# Patient Record
Sex: Female | Born: 1985 | Race: White | Hispanic: No | Marital: Married | Smoking: Never smoker
Health system: Southern US, Community
[De-identification: ages and names within clinical notes are randomized; demographics above are authoritative.]

## PROBLEM LIST (undated history)

## (undated) ENCOUNTER — Inpatient Hospital Stay (HOSPITAL_COMMUNITY): Payer: Self-pay

## (undated) DIAGNOSIS — F32A Depression, unspecified: Secondary | ICD-10-CM

## (undated) DIAGNOSIS — D680 Von Willebrand's disease: Secondary | ICD-10-CM

## (undated) DIAGNOSIS — D649 Anemia, unspecified: Secondary | ICD-10-CM

## (undated) DIAGNOSIS — F319 Bipolar disorder, unspecified: Secondary | ICD-10-CM

## (undated) DIAGNOSIS — G43909 Migraine, unspecified, not intractable, without status migrainosus: Secondary | ICD-10-CM

## (undated) DIAGNOSIS — N289 Disorder of kidney and ureter, unspecified: Secondary | ICD-10-CM

## (undated) DIAGNOSIS — Z87442 Personal history of urinary calculi: Secondary | ICD-10-CM

## (undated) DIAGNOSIS — R011 Cardiac murmur, unspecified: Secondary | ICD-10-CM

## (undated) DIAGNOSIS — O24419 Gestational diabetes mellitus in pregnancy, unspecified control: Secondary | ICD-10-CM

## (undated) DIAGNOSIS — F329 Major depressive disorder, single episode, unspecified: Secondary | ICD-10-CM

## (undated) DIAGNOSIS — K219 Gastro-esophageal reflux disease without esophagitis: Secondary | ICD-10-CM

## (undated) DIAGNOSIS — N200 Calculus of kidney: Secondary | ICD-10-CM

## (undated) DIAGNOSIS — D259 Leiomyoma of uterus, unspecified: Secondary | ICD-10-CM

## (undated) DIAGNOSIS — Z8719 Personal history of other diseases of the digestive system: Secondary | ICD-10-CM

## (undated) DIAGNOSIS — D759 Disease of blood and blood-forming organs, unspecified: Secondary | ICD-10-CM

## (undated) HISTORY — DX: Gestational diabetes mellitus in pregnancy, unspecified control: O24.419

## (undated) HISTORY — DX: Bipolar disorder, unspecified: F31.9

## (undated) HISTORY — DX: Depression, unspecified: F32.A

## (undated) HISTORY — DX: Major depressive disorder, single episode, unspecified: F32.9

## (undated) HISTORY — PX: RENAL ARTERY STENT: SHX2321

---
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
## 2004-03-23 ENCOUNTER — Emergency Department (HOSPITAL_COMMUNITY): Admission: EM | Admit: 2004-03-23 | Discharge: 2004-03-23 | Payer: Self-pay | Admitting: Family Medicine

## 2004-10-19 ENCOUNTER — Ambulatory Visit (HOSPITAL_COMMUNITY): Admission: RE | Admit: 2004-10-19 | Discharge: 2004-10-19 | Payer: Self-pay | Admitting: Family Medicine

## 2004-10-19 ENCOUNTER — Emergency Department (HOSPITAL_COMMUNITY): Admission: EM | Admit: 2004-10-19 | Discharge: 2004-10-19 | Payer: Self-pay | Admitting: Family Medicine

## 2004-12-16 ENCOUNTER — Ambulatory Visit (HOSPITAL_COMMUNITY): Admission: RE | Admit: 2004-12-16 | Discharge: 2004-12-16 | Payer: Self-pay | Admitting: Urology

## 2004-12-16 ENCOUNTER — Ambulatory Visit (HOSPITAL_BASED_OUTPATIENT_CLINIC_OR_DEPARTMENT_OTHER): Admission: RE | Admit: 2004-12-16 | Discharge: 2004-12-16 | Payer: Self-pay | Admitting: Urology

## 2004-12-18 ENCOUNTER — Emergency Department (HOSPITAL_COMMUNITY): Admission: EM | Admit: 2004-12-18 | Discharge: 2004-12-18 | Payer: Self-pay | Admitting: Emergency Medicine

## 2005-12-18 ENCOUNTER — Emergency Department (HOSPITAL_COMMUNITY): Admission: EM | Admit: 2005-12-18 | Discharge: 2005-12-18 | Payer: Self-pay | Admitting: Family Medicine

## 2006-01-24 ENCOUNTER — Emergency Department (HOSPITAL_COMMUNITY): Admission: EM | Admit: 2006-01-24 | Discharge: 2006-01-24 | Payer: Self-pay | Admitting: Family Medicine

## 2006-02-01 ENCOUNTER — Emergency Department (HOSPITAL_COMMUNITY): Admission: EM | Admit: 2006-02-01 | Discharge: 2006-02-01 | Payer: Self-pay | Admitting: Family Medicine

## 2006-02-09 ENCOUNTER — Emergency Department (HOSPITAL_COMMUNITY): Admission: EM | Admit: 2006-02-09 | Discharge: 2006-02-09 | Payer: Self-pay | Admitting: Emergency Medicine

## 2006-12-09 ENCOUNTER — Emergency Department (HOSPITAL_COMMUNITY): Admission: EM | Admit: 2006-12-09 | Discharge: 2006-12-09 | Payer: Self-pay | Admitting: Emergency Medicine

## 2006-12-14 ENCOUNTER — Emergency Department (HOSPITAL_COMMUNITY): Admission: EM | Admit: 2006-12-14 | Discharge: 2006-12-14 | Payer: Self-pay | Admitting: Emergency Medicine

## 2007-01-08 ENCOUNTER — Emergency Department (HOSPITAL_COMMUNITY): Admission: EM | Admit: 2007-01-08 | Discharge: 2007-01-08 | Payer: Self-pay | Admitting: Emergency Medicine

## 2007-02-08 ENCOUNTER — Emergency Department (HOSPITAL_COMMUNITY): Admission: EM | Admit: 2007-02-08 | Discharge: 2007-02-08 | Payer: Self-pay | Admitting: Family Medicine

## 2007-04-21 ENCOUNTER — Emergency Department (HOSPITAL_COMMUNITY): Admission: EM | Admit: 2007-04-21 | Discharge: 2007-04-21 | Payer: Self-pay | Admitting: Emergency Medicine

## 2007-08-11 ENCOUNTER — Emergency Department (HOSPITAL_COMMUNITY): Admission: EM | Admit: 2007-08-11 | Discharge: 2007-08-11 | Payer: Self-pay | Admitting: Family Medicine

## 2007-11-26 ENCOUNTER — Emergency Department (HOSPITAL_COMMUNITY): Admission: EM | Admit: 2007-11-26 | Discharge: 2007-11-27 | Payer: Self-pay | Admitting: Emergency Medicine

## 2008-03-11 ENCOUNTER — Inpatient Hospital Stay (HOSPITAL_COMMUNITY): Admission: EM | Admit: 2008-03-11 | Discharge: 2008-03-12 | Payer: Self-pay | Admitting: Emergency Medicine

## 2008-03-15 ENCOUNTER — Emergency Department (HOSPITAL_COMMUNITY): Admission: EM | Admit: 2008-03-15 | Discharge: 2008-03-15 | Payer: Self-pay | Admitting: Family Medicine

## 2008-05-22 ENCOUNTER — Emergency Department (HOSPITAL_COMMUNITY): Admission: EM | Admit: 2008-05-22 | Discharge: 2008-05-22 | Payer: Self-pay | Admitting: Emergency Medicine

## 2008-11-02 ENCOUNTER — Inpatient Hospital Stay (HOSPITAL_COMMUNITY): Admission: AD | Admit: 2008-11-02 | Discharge: 2008-11-02 | Payer: Self-pay | Admitting: Obstetrics & Gynecology

## 2008-11-03 ENCOUNTER — Inpatient Hospital Stay (HOSPITAL_COMMUNITY): Admission: AD | Admit: 2008-11-03 | Discharge: 2008-11-03 | Payer: Self-pay | Admitting: Obstetrics & Gynecology

## 2008-11-03 ENCOUNTER — Encounter: Payer: Self-pay | Admitting: Obstetrics & Gynecology

## 2008-11-12 ENCOUNTER — Ambulatory Visit: Payer: Self-pay | Admitting: Obstetrics & Gynecology

## 2008-11-12 ENCOUNTER — Encounter: Payer: Self-pay | Admitting: Obstetrics & Gynecology

## 2008-11-12 LAB — CONVERTED CEMR LAB: hCG, Beta Chain, Quant, S: <2 milliintl units/mL

## 2008-12-01 ENCOUNTER — Emergency Department (HOSPITAL_COMMUNITY): Admission: EM | Admit: 2008-12-01 | Discharge: 2008-12-01 | Payer: Self-pay | Admitting: Emergency Medicine

## 2009-05-29 ENCOUNTER — Emergency Department (HOSPITAL_COMMUNITY): Admission: EM | Admit: 2009-05-29 | Discharge: 2009-05-29 | Payer: Self-pay | Admitting: Family Medicine

## 2009-09-29 ENCOUNTER — Ambulatory Visit: Payer: Self-pay | Admitting: Family Medicine

## 2009-09-29 ENCOUNTER — Encounter: Payer: Self-pay | Admitting: Obstetrics & Gynecology

## 2009-09-29 LAB — CONVERTED CEMR LAB
Antibody Screen: NEGATIVE
Eosinophils Relative: 2 % (ref 0–5)
HCT: 35.4 % — ABNORMAL LOW (ref 36.0–46.0)
Lymphocytes Relative: 17 % (ref 12–46)
Lymphs Abs: 1.3 10*3/uL (ref 0.7–4.0)
Monocytes Absolute: 0.7 10*3/uL (ref 0.1–1.0)
Monocytes Relative: 9 % (ref 3–12)
Neutro Abs: 5.7 10*3/uL (ref 1.7–7.7)
Neutrophils Relative %: 73 % (ref 43–77)
Platelets: 228 10*3/uL (ref 150–400)
RDW: 13.9 % (ref 11.5–15.5)
WBC: 7.8 10*3/uL (ref 4.0–10.5)

## 2009-10-27 ENCOUNTER — Ambulatory Visit: Payer: Self-pay | Admitting: Family Medicine

## 2009-11-03 ENCOUNTER — Ambulatory Visit (HOSPITAL_COMMUNITY): Admission: RE | Admit: 2009-11-03 | Discharge: 2009-11-03 | Payer: Self-pay | Admitting: Family Medicine

## 2009-11-04 ENCOUNTER — Inpatient Hospital Stay (HOSPITAL_COMMUNITY): Admission: AD | Admit: 2009-11-04 | Discharge: 2009-11-04 | Payer: Self-pay | Admitting: Family Medicine

## 2009-11-09 ENCOUNTER — Ambulatory Visit: Payer: Self-pay | Admitting: Family Medicine

## 2009-11-10 ENCOUNTER — Encounter: Payer: Self-pay | Admitting: Family Medicine

## 2009-11-18 ENCOUNTER — Inpatient Hospital Stay (HOSPITAL_COMMUNITY): Admission: AD | Admit: 2009-11-18 | Discharge: 2009-11-18 | Payer: Self-pay | Admitting: Obstetrics & Gynecology

## 2009-11-28 ENCOUNTER — Ambulatory Visit: Payer: Self-pay | Admitting: Family Medicine

## 2009-11-28 LAB — CONVERTED CEMR LAB
Bilirubin Urine: NEGATIVE
Glucose, Urine, Semiquant: NEGATIVE
Ketones, urine, test strip: NEGATIVE
Protein, U semiquant: NEGATIVE

## 2009-12-01 ENCOUNTER — Ambulatory Visit: Payer: Self-pay | Admitting: Family Medicine

## 2009-12-08 ENCOUNTER — Ambulatory Visit (HOSPITAL_COMMUNITY): Admission: RE | Admit: 2009-12-08 | Discharge: 2009-12-08 | Payer: Self-pay | Admitting: Obstetrics & Gynecology

## 2009-12-30 ENCOUNTER — Ambulatory Visit: Payer: Self-pay | Admitting: Obstetrics & Gynecology

## 2009-12-31 ENCOUNTER — Encounter: Payer: Self-pay | Admitting: Obstetrics & Gynecology

## 2010-01-27 ENCOUNTER — Ambulatory Visit: Payer: Self-pay | Admitting: Obstetrics & Gynecology

## 2010-02-25 ENCOUNTER — Ambulatory Visit: Payer: Self-pay | Admitting: Advanced Practice Midwife

## 2010-02-25 ENCOUNTER — Inpatient Hospital Stay (HOSPITAL_COMMUNITY): Admission: AD | Admit: 2010-02-25 | Discharge: 2010-02-26 | Payer: Self-pay | Admitting: Family Medicine

## 2010-03-01 ENCOUNTER — Ambulatory Visit: Payer: Self-pay | Admitting: Obstetrics & Gynecology

## 2010-03-01 LAB — CONVERTED CEMR LAB
HCT: 33.4 % — ABNORMAL LOW (ref 36.0–46.0)
Hemoglobin: 10.9 g/dL — ABNORMAL LOW (ref 12.0–15.0)
MCV: 91.8 fL (ref 78.0–100.0)
Platelets: 227 10*3/uL (ref 150–400)
WBC: 10.8 10*3/uL — ABNORMAL HIGH (ref 4.0–10.5)

## 2010-03-09 ENCOUNTER — Encounter (INDEPENDENT_AMBULATORY_CARE_PROVIDER_SITE_OTHER): Payer: Self-pay | Admitting: *Deleted

## 2010-03-09 ENCOUNTER — Ambulatory Visit: Payer: Self-pay | Admitting: Obstetrics & Gynecology

## 2010-03-11 ENCOUNTER — Inpatient Hospital Stay (HOSPITAL_COMMUNITY): Admission: AD | Admit: 2010-03-11 | Discharge: 2010-03-12 | Payer: Self-pay | Admitting: Obstetrics & Gynecology

## 2010-03-15 ENCOUNTER — Ambulatory Visit: Payer: Self-pay | Admitting: Obstetrics and Gynecology

## 2010-03-19 ENCOUNTER — Ambulatory Visit: Payer: Self-pay | Admitting: Advanced Practice Midwife

## 2010-03-19 ENCOUNTER — Inpatient Hospital Stay (HOSPITAL_COMMUNITY): Admission: AD | Admit: 2010-03-19 | Discharge: 2010-03-19 | Payer: Self-pay | Admitting: Obstetrics & Gynecology

## 2010-03-25 ENCOUNTER — Inpatient Hospital Stay (HOSPITAL_COMMUNITY): Admission: AD | Admit: 2010-03-25 | Discharge: 2010-03-26 | Payer: Self-pay | Admitting: Obstetrics & Gynecology

## 2010-03-25 DIAGNOSIS — O4700 False labor before 37 completed weeks of gestation, unspecified trimester: Secondary | ICD-10-CM

## 2010-03-29 ENCOUNTER — Ambulatory Visit: Payer: Self-pay | Admitting: Obstetrics & Gynecology

## 2010-03-29 LAB — CONVERTED CEMR LAB: TSH: 2.003 microintl units/mL (ref 0.350–4.500)

## 2010-04-11 ENCOUNTER — Ambulatory Visit: Payer: Self-pay | Admitting: Obstetrics and Gynecology

## 2010-04-19 ENCOUNTER — Ambulatory Visit: Payer: Self-pay | Admitting: Obstetrics & Gynecology

## 2010-04-20 ENCOUNTER — Encounter: Payer: Self-pay | Admitting: Obstetrics & Gynecology

## 2010-04-26 ENCOUNTER — Ambulatory Visit: Payer: Self-pay | Admitting: Family Medicine

## 2010-05-03 ENCOUNTER — Ambulatory Visit: Payer: Self-pay | Admitting: Family Medicine

## 2010-05-05 ENCOUNTER — Ambulatory Visit: Payer: Self-pay | Admitting: Obstetrics & Gynecology

## 2010-05-06 ENCOUNTER — Emergency Department (HOSPITAL_COMMUNITY): Admission: EM | Admit: 2010-05-06 | Discharge: 2010-05-06 | Payer: Self-pay | Admitting: Emergency Medicine

## 2010-05-08 ENCOUNTER — Ambulatory Visit: Payer: Self-pay | Admitting: Obstetrics & Gynecology

## 2010-05-08 ENCOUNTER — Inpatient Hospital Stay (HOSPITAL_COMMUNITY): Admission: AD | Admit: 2010-05-08 | Discharge: 2010-05-12 | Payer: Self-pay | Admitting: Obstetrics and Gynecology

## 2010-05-12 ENCOUNTER — Inpatient Hospital Stay (HOSPITAL_COMMUNITY)
Admission: AD | Admit: 2010-05-12 | Discharge: 2010-05-13 | Payer: Self-pay | Source: Home / Self Care | Admitting: Obstetrics & Gynecology

## 2010-05-12 ENCOUNTER — Emergency Department (HOSPITAL_COMMUNITY): Admission: EM | Admit: 2010-05-12 | Discharge: 2010-05-12 | Payer: Self-pay | Admitting: Emergency Medicine

## 2010-06-09 ENCOUNTER — Ambulatory Visit: Payer: Self-pay | Admitting: Obstetrics & Gynecology

## 2010-06-14 ENCOUNTER — Ambulatory Visit: Payer: Self-pay | Admitting: Nurse Practitioner

## 2010-07-14 NOTE — Op Note (Addendum)
Meyers, [name]             ACCOUNT NO.:  [id number]  MEDICAL RECORD NO.:  [id number]          PATIENT TYPE:  INP  LOCATION:  9103                          FACILITY:  [facility]  PHYSICIAN:  Lucina Mellow, DO   DATE OF BIRTH:  1985-07-16  DATE OF PROCEDURE: DATE OF DISCHARGE:                              OPERATIVE REPORT   PREOPERATIVE DIAGNOSES:  A 25 year old, gravida 1, para 0, at 57 and 3 weeks' estimated gestational age noted to have nonreassuring fetal heart tones after diagnoses of chorioamnionitis/maternal fever and arrest of descent after 3 hours of pushing.  PROCEDURE:  Primary low transverse cesarean section.  SURGEON:  Allie Bossier, MD  ASSISTANT:  Lucina Mellow, DO  ANESTHESIA:  Epidural.  ESTIMATED BLOOD LOSS:  600 mL.  FINDINGS:  A female infant in the vertex presentation, Apgars 3 and eight at 1 and five minutes, weight was 6 pounds even.  Uterus was normal.  The placenta once delivered was noted to be stained with meconium.  Ovaries were noted to be normal.  Cord pH was 7.22.  The infant was delivered and born at 68 on May [birth year], 2010.  PROCEDURE:  The patient was taken to the operating room, where epidural anesthesia was found to be adequate.  She was prepped and draped in the normal sterile fashion in the dorsal supine position with a leftward tilt, 30 mL of 0.5% Marcaine were injected along the location for the skin incision.  A Pfannenstiel skin incision was then made with the scalpel and carried through to the underlying layer of the fascia with the blade.  The fascia was incised in the midline and the incision was extended laterally with Mayo scissors.  The superior aspect of the fascial incision was then grasped with the Kocher clamps, elevated and the underlying rectus muscles dissected off bluntly.  Attention was then turned to the inferior aspect of the incision which in similar fashion was grasped, tended up and the Kocher clamps and  the rectus muscle dissected off bluntly.  The rectus muscle was then separated in the midline and Bovie was used to cauterize and divide approximately 1/3rd of the distance transversely and bilaterally with good hemostasis.  The peritoneum was then identified, tented up and entered sharply with hemostats and dissected bluntly.  The bladder peritoneal opening was extended superiorly and inferiorly with good visualization with the bladder with the Foley catheter noted.  The bladder blade was inserted and the vesicouterine peritoneum was identified and the bladder flap was created digitally.  The bladder blade was reinserted and the lower uterine segment was incised in a transverse fashion with the scalpel. The uterine incision was extended laterally and bluntly.  The bladder blade was removed and the infant's head was delivered atraumatically. The nose and mouth were suctioned with the bulb and the cord was clamped and cut.  Cord gas and cord blood samples were obtained and handed off. The infant was handed off to the awaiting NICU team.  Meconium was noted at the head delivery and the placenta was stained with meconium.  The placenta was removed manually and the uterus was exteriorized and  cleared off all clots and debris.  The uterine incision was repaired with 2-0 Vicryl in a running locked fashion.  The uterus was returned to the abdomen.  The gutters were cleared off all clots and the fascia was approximated with 0 Vicryl at a running fashion.  The skin was closed with subcuticular stitches.  At the time, the uterus was exteriorized the fallopian tubes and ovaries were noted to be within normal appearance.  The patient tolerated the procedure well.  Sponge, lap, instrument and needle counts were correct x3.  Because the patient was already on ampicillin and gentamicin prior to delivery, additional antibiotics were not given at the time prior to delivery.  The patient was taken to the  recovery room in stable condition and the infant was handed off to the NICU team.          ______________________________ Lucina Mellow, DO     SH/MEDQ  D:  05/09/2010  T:  05/10/2010  Job:  [id number]  Electronically Signed by Lucina Mellow MD on 05/20/2010 08:54:51 AM Electronically Signed by Nicholaus Bloom MD on 07/14/2010 03:45:57 PM

## 2010-07-19 NOTE — Assessment & Plan Note (Signed)
Summary: 4 MONTHS PREG-DIZZY/LIGHTHEADED/JBB   Vital Signs:  Patient Profile:   25 Years Old Female CC:      4 months Height:     61.25 inches Weight:      148 pounds Temp:     97.5 degrees F oral Pulse rate:   76 / minute Pulse rhythm:   regular Resp:     20 per minute BP sitting:   104 / 60  (left arm) Cuff size:   regular  Vitals Entered By: Providence Crosby LPN (November 28, 2009 1:38 PM)                  Current Allergies (reviewed today): ! CIPRO (CIPROFLOXACIN HCL) ! ERYTHROMYCIN BASE (ERYTHROMYCIN BASE)History of Present Illness Reason for visit: dizzy; nausea for 30 minutes Chief Complaint: 4 months History of Present Illness: walking around in [employer] became very hot and dizzy and nauseated  REVIEW OF SYSTEMS Constitutional Symptoms       Complains of fatigue.     Denies fever, chills, night sweats, weight loss, and weight gain.  Eyes       Complains of glasses.      Denies change in vision, eye pain, eye discharge, contact lenses, and eye surgery.      Comments: does not wear them Ear/Nose/Throat/Mouth       Complains of dizziness.      Denies hearing loss/aids, change in hearing, ear pain, ear discharge, frequent runny nose, frequent nose bleeds, sinus problems, sore throat, hoarseness, and tooth pain or bleeding.  Respiratory       Denies dry cough, productive cough, wheezing, shortness of breath, asthma, bronchitis, and emphysema/COPD.      Comments: coughs when nauseated Cardiovascular       Denies murmurs, chest pain, and tires easily with exhertion.    Gastrointestinal       Complains of nausea/vomiting.      Denies stomach pain, diarrhea, constipation, blood in bowel movements, and indigestion. Genitourniary       Complains of kidney stones.      Denies painful urination, blood or discharge from vagina, and loss of urinary control. Neurological       Denies paralysis, seizures, and fainting/blackouts. Musculoskeletal       Denies muscle pain, joint pain,  joint stiffness, decreased range of motion, redness, swelling, muscle weakness, and gout.  Skin       Denies bruising, unusual mles/lumps or sores, and hair/skin or nail changes.  Psych       Denies mood changes, temper/anger issues, anxiety/stress, speech problems, depression, and sleep problems.  Family History: Father: 35 yoa diabetes ; heart disease;  Mother: alive 67 yoa  no health problems Siblings: 1 brother alive and well  Social History: Marital Status: Occupation: [employer] works at [employer]  Allergies (verified): 1)  ! Cipro (Ciprofloxacin Hcl) 2)  ! Erythromycin Base (Erythromycin Base)   Physical Exam Head: normocephalic, atraumatic Eyes: conjunctivae and lids normal Pupils: equal, round, reactive to light Ears: normal, no lesions or deformities Nasal: mucosa pink, nonedematous, no septal deviation, turbinates normal Oral/Pharynx: tongue normal, posterior pharynx without erythema or exudate. Moist memebranes Neck: neck supple,  trachea midline, no masses Thyroid: no nodules, masses, tenderness, or enlargement Chest/Lungs: no rales, wheezes, or rhonchi bilateral, breath sounds equal without effort Heart: regular rate and  rhythm, no murmur Abdomen: Pregant, size consistant with 4 mo.  Good Bowel sounds GU: deferred Extremities: No pitting edema Neurological: grossly intact and non-focal Skin: no  obvious rashes or lesions Assessment New Problems: PREGNANCY (ICD-V22.2)   Patient Education: Patient and/or caregiver instructed in the following: rest, fluids.  Plan Planning Comments:   Stay out of work until you see Obstetrician on 15th and then she and you can decide if you can return to work.   Ask work if they can find you work without getting too hot.  Return to work/school on 12/02/2009  The patient and/or caregiver has been counseled thoroughly with regard to medications prescribed including dosage, schedule, interactions, rationale for use, and  possible side effects and they verbalize understanding.  Diagnoses and expected course of recovery discussed and will return if not improved as expected or if the condition worsens. Patient and/or caregiver verbalized understanding.   Patient Instructions: 1)  Planning Comments:   2)  Stay out of work until you see Obstetrician on 15th and then she and you can decide if you can return to work.   3)  Ask work if they can find you work without getting too hot.  Laboratory Results   Urine Tests  Date/Time Recieved: November 28, 2009 1:59 PM Date/Time Reported: November 28, 2009 1:59 PM  Routine Urinalysis   Color: lt. yellow Appearance: Clear Glucose: negative   (Normal Range: Negative) Bilirubin: negative   (Normal Range: Negative) Ketone: negative   (Normal Range: Negative) Spec. Gravity: 1.010   (Normal Range: 1.003-1.035) Blood: negative   (Normal Range: Negative) pH: 7.5   (Normal Range: 5.0-8.0) Protein: negative   (Normal Range: Negative) Urobilinogen: 0.2   (Normal Range: 0-1) Nitrite: negative   (Normal Range: Negative) Leukocyte Esterace: small   (Normal Range: Negative)     Blood Tests   Date/Time Recieved: November 28, 2009 2:07 PM Date/Time Reported: November 28, 2009 2:07 PM  Glucose (random): 104 mg/dL   (Normal Range: 84-132)    It was clearly explained to the patient that this [facility] is not intended to be a primary care clinic.  The patient is always better served by the continuity of care and the provider/patient relationships developed with their dedicated primary care provider.  The patient was told to be sure to follow up as soon as possible with their primary care provider to discuss treatments received and to receive further examination and testing.  The patient verbalized understanding. The will f/u with PCP ASAP.   The risks, benefits and possible side effects were clearly explained and discussed with the patient.  The patient verbalized clear understanding.  The  patient was given instructions to return if symptoms don't improve, worsen or new changes develop.  If it is not during clinic hours and the patient cannot get back to this clinic then the patient was told to seek medical care at an available urgent care or emergency department.  The patient verbalized understanding.    The patient was informed that there is no on-call provider or services available at this clinic during off-hours (when the clinic is closed).  If the patient developed a problem or concern that required immediate attention, the patient was advised to go the the nearest available urgent care or emergency department for medical care.  The patient verbalized understanding.     I have reviewed the above medical office visit documention, including diagnoses, history, medications, clinical lists, orders and plan of care.   Rodney Langton, MD, FAAFP  December 06, 2009 Added new allergy or adverse reaction of CIPRO (CIPROFLOXACIN HCL) - Signed Added new allergy or adverse reaction of ERYTHROMYCIN BASE (  ERYTHROMYCIN BASE) - Signed

## 2010-07-19 NOTE — Letter (Signed)
Summary: WORK EXCUSE  WORK EXCUSE   Imported By: Rosine Beat 11/28/2009 17:00:34  _____________________________________________________________________  External Attachment:    Type:   Image     Comment:   External Document

## 2010-08-01 ENCOUNTER — Emergency Department (HOSPITAL_COMMUNITY)
Admission: EM | Admit: 2010-08-01 | Discharge: 2010-08-02 | Disposition: A | Discharge status: Home / Self Care | Payer: Self-pay | Attending: Emergency Medicine | Admitting: Emergency Medicine

## 2010-08-01 DIAGNOSIS — M79609 Pain in unspecified limb: Secondary | ICD-10-CM | POA: Insufficient documentation

## 2010-08-01 DIAGNOSIS — Z0000 Encounter for general adult medical examination without abnormal findings: Secondary | ICD-10-CM | POA: Insufficient documentation

## 2010-08-30 LAB — CBC
HCT: 22.8 % — ABNORMAL LOW (ref 36.0–46.0)
Hemoglobin: 10.8 g/dL — ABNORMAL LOW (ref 12.0–15.0)
Hemoglobin: 7.7 g/dL — ABNORMAL LOW (ref 12.0–15.0)
MCH: 29.3 pg (ref 26.0–34.0)
MCHC: 33.5 g/dL (ref 30.0–36.0)
MCV: 86.8 fL (ref 78.0–100.0)
MCV: 87.1 fL (ref 78.0–100.0)
MCV: 87.3 fL (ref 78.0–100.0)
Platelets: 235 10*3/uL (ref 150–400)
Platelets: 263 10*3/uL (ref 150–400)
RBC: 2.61 MIL/uL — ABNORMAL LOW (ref 3.87–5.11)
RDW: 15 % (ref 11.5–15.5)
RDW: 15.6 % — ABNORMAL HIGH (ref 11.5–15.5)

## 2010-08-30 LAB — RPR: RPR Ser Ql: NONREACTIVE

## 2010-08-31 LAB — POCT URINALYSIS DIPSTICK
Glucose, UA: NEGATIVE mg/dL
Specific Gravity, Urine: 1.015 (ref 1.005–1.030)
Urobilinogen, UA: 0.2 mg/dL (ref 0.0–1.0)

## 2010-09-01 LAB — URINALYSIS, ROUTINE W REFLEX MICROSCOPIC
Bilirubin Urine: NEGATIVE
Bilirubin Urine: NEGATIVE
Glucose, UA: NEGATIVE mg/dL
Ketones, ur: NEGATIVE mg/dL
Nitrite: NEGATIVE
Protein, ur: NEGATIVE mg/dL
Protein, ur: NEGATIVE mg/dL
Specific Gravity, Urine: 1.015 (ref 1.005–1.030)
Specific Gravity, Urine: 1.015 (ref 1.005–1.030)
Urobilinogen, UA: 0.2 mg/dL (ref 0.0–1.0)
Urobilinogen, UA: 4 mg/dL — ABNORMAL HIGH (ref 0.0–1.0)
Urobilinogen, UA: 0.2 mg/dL (ref 0.0–1.0)
pH: 7.5 (ref 5.0–8.0)

## 2010-09-01 LAB — URINE CULTURE
Colony Count: >=100000
Colony Count: 50000
Culture  Setup Time: 201109101124

## 2010-09-01 LAB — WET PREP, GENITAL
Clue Cells Wet Prep HPF POC: NONE SEEN
Trich, Wet Prep: NONE SEEN

## 2010-09-01 LAB — URINE MICROSCOPIC-ADD ON

## 2010-09-01 LAB — GC/CHLAMYDIA PROBE AMP, GENITAL
Chlamydia, DNA Probe: NEGATIVE
GC Probe Amp, Genital: NEGATIVE
GC Probe Amp, Genital: NEGATIVE

## 2010-09-05 LAB — URINALYSIS, ROUTINE W REFLEX MICROSCOPIC
Glucose, UA: NEGATIVE mg/dL
Ketones, ur: 15 mg/dL — AB
Ketones, ur: NEGATIVE mg/dL
Nitrite: NEGATIVE
Protein, ur: NEGATIVE mg/dL
pH: 8 (ref 5.0–8.0)
pH: 7 (ref 5.0–8.0)

## 2010-09-05 LAB — URINE MICROSCOPIC-ADD ON

## 2010-09-05 LAB — COMPREHENSIVE METABOLIC PANEL
ALT: 21 U/L (ref 0–35)
Albumin: 3.5 g/dL (ref 3.5–5.2)
Calcium: 8.8 mg/dL (ref 8.4–10.5)
GFR calc Af Amer: >60 mL/min (ref >60)
Glucose, Bld: 92 mg/dL (ref 70–99)
Sodium: 130 mEq/L — ABNORMAL LOW (ref 135–145)
Total Protein: 7 g/dL (ref 6.0–8.3)

## 2010-09-05 LAB — CBC
Hemoglobin: 12.6 g/dL (ref 12.0–15.0)
Hemoglobin: 11.7 g/dL — ABNORMAL LOW (ref 12.0–15.0)
MCHC: 34.9 g/dL (ref 30.0–36.0)
MCHC: 35.1 g/dL (ref 30.0–36.0)
MCV: 91.4 fL (ref 78.0–100.0)
Platelets: 207 10*3/uL (ref 150–400)
RBC: 3.66 MIL/uL — ABNORMAL LOW (ref 3.87–5.11)
RDW: 14.2 % (ref 11.5–15.5)

## 2010-09-05 LAB — GC/CHLAMYDIA PROBE AMP, GENITAL
Chlamydia, DNA Probe: NEGATIVE
GC Probe Amp, Genital: NEGATIVE

## 2010-09-26 LAB — URINALYSIS, ROUTINE W REFLEX MICROSCOPIC
Leukocytes, UA: NEGATIVE
Nitrite: NEGATIVE
Protein, ur: 100 mg/dL — AB
Urobilinogen, UA: 1 mg/dL (ref 0.0–1.0)

## 2010-09-26 LAB — GC/CHLAMYDIA PROBE AMP, GENITAL: Chlamydia, DNA Probe: NEGATIVE

## 2010-09-26 LAB — URINE MICROSCOPIC-ADD ON

## 2010-09-27 LAB — URINE MICROSCOPIC-ADD ON

## 2010-09-27 LAB — HCG, QUANTITATIVE, PREGNANCY: hCG, Beta Chain, Quant, S: 11 m[IU]/mL — ABNORMAL HIGH (ref <5)

## 2010-09-27 LAB — CBC
HCT: 34.6 % — ABNORMAL LOW (ref 36.0–46.0)
Hemoglobin: 12.1 g/dL (ref 12.0–15.0)
MCHC: 35.1 g/dL (ref 30.0–36.0)
Platelets: 221 10*3/uL (ref 150–400)
RDW: 14.8 % (ref 11.5–15.5)

## 2010-09-27 LAB — GC/CHLAMYDIA PROBE AMP, GENITAL: GC Probe Amp, Genital: NEGATIVE

## 2010-09-27 LAB — URINALYSIS, ROUTINE W REFLEX MICROSCOPIC
Glucose, UA: NEGATIVE mg/dL
Leukocytes, UA: NEGATIVE
Specific Gravity, Urine: 1.02 (ref 1.005–1.030)

## 2010-09-27 LAB — WET PREP, GENITAL
Clue Cells Wet Prep HPF POC: NONE SEEN
Trich, Wet Prep: NONE SEEN

## 2010-09-27 LAB — ABO/RH: ABO/RH(D): A POS

## 2010-11-01 NOTE — Assessment & Plan Note (Signed)
[name], [name]             ACCOUNT NO.:  [id number]   MEDICAL RECORD NO.:  [id number]          PATIENT TYPE:  POB   LOCATION:  [facility] at [facility]         FACILITY:  [facility]   PHYSICIAN:  Allie Bossier, MD        DATE OF BIRTH:  [birth year]   DATE OF SERVICE:  06/09/2010                                  CLINIC NOTE   IDENTIFICATION:  [name] is a 25 year old single white, G1 now P1,  status post primary low transverse cesarean section on May 09, 2010.  The indication with C-section was failed to progress and  nonreassuring fetal heart rate.  Postpartum, she has done very well.  She is bottle feeding.  She denies baby blues.  She and her boyfriend  did have unprotected intercourse about a week ago, but she has since  started her period and is currently menstruating.  She reports normal  bowel and bladder function.  For birth control, she plans with the  Implanon placed this coming up Tuesday.  She is taking off school  __________ until next semester in the summer.   PHYSICAL EXAMINATION:  VITAL SIGNS:  Height is 5 feet 1 inches, weight  157 pounds, blood pressure 101/58, pulse 90.  Please note, her weight at  the term was 171 pounds.  SKIN:  Her incision has well-healed.  The scar is still slightly red as  would be expected.  I have recommended Mederma or Scar Zone.  She will  come back.   ASSESSMENT AND PLAN:  Postpartum stable.  She will come back in 4 days  for an Implanon and she will come back in June for her annual.      Allie Bossier, MD     MCD/MEDQ  D:  06/09/2010  T:  06/10/2010  Job:  [id number]
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
## 2010-11-01 NOTE — Assessment & Plan Note (Signed)
NAME:  [name], [name]             ACCOUNT NO.:  [id number]   MEDICAL RECORD NO.:  [id number]          PATIENT TYPE:  POB   LOCATION:  [facility]         FACILITY:  [facility]   PHYSICIAN:  Allie Bossier, MD        DATE OF BIRTH:  1985/06/25   DATE OF SERVICE:                                  CLINIC NOTE   The patient comes to the office today for an Implanon insertion.  The  patient denies any unprotected intercourse for the last 2 weeks.  Urine  pregnancy test today is negative.  The patient is on her menstrual cycle  today.  The patient signed a consent form.  The patient has okayed  placement in her right arm.  She describes herself as being ambidextrous  and prefers the right arm.  The site is cleaned with Betadine.  The area  is marked.  The area is injected with 3 mL of lidocaine.  The area has  numb effect.  The Implanon is inserted without any difficulty.  Placement is identified by the patient and practitioner.  The incision  is cleaned and dressed.  The patient is given instructions to not have  any unprotected intercourse for 1 month.  She will take Motrin as needed  for site injection pain.  She will return to the office on an as-needed  basis.      Remonia Richter, NP    ______________________________  Allie Bossier, MD    LR/MEDQ  D:  06/14/2010  T:  06/15/2010  Job:  [id number]

## 2010-11-04 NOTE — Op Note (Signed)
NAMEVANETTE, [name]             ACCOUNT NO.:  [id number]   MEDICAL RECORD NO.:  [id number]          PATIENT TYPE:  AMB   LOCATION:  [facility]                         FACILITY:  [facility]   PHYSICIAN:  Lindaann Slough, M.D.  DATE OF BIRTH:  19-Sep-1985   DATE OF PROCEDURE:  12/16/2004  DATE OF DISCHARGE:                                 OPERATIVE REPORT   PREOPERATIVE DIAGNOSIS:  Right ureteral calculus.   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   PROCEDURE:  Cystoscopy, right retrograde pyelogram, ureteroscopy, ureteral  dilation and insertion of right double-J catheter.   SURGEON:  Danae Chen, M.D.   ANESTHESIA:  General.   INDICATIONS FOR PROCEDURE:  The patient is a 25 year old female who has been  complaining of right flank pain radiating to the right lower quadrant. She  started having pain about two months ago. A CT scan of the abdomen and  pelvis showed bilateral punctate renal calculi and a 2-3 mm calculus at the  right UPJ. The patient was treated conservatively with oral analgesics.  However, she has continued to complain of pain on and off. Repeat KUB on  November 17, 2004 showed a faint calcification in the upper ureter at the  transverse process of L3. Because of the size of the stone, it was felt that  the patient should be treated conservatively but she has continued to  complain of pain and she was seen in the office two days ago complaining of  severe right flank pain. She is scheduled now for cystoscopy and retrograde  pyelogram and stone manipulation.   Under general anesthesia, the patient was prepped and draped and placed in  the dorsal lithotomy position. A #22 Wappler cystoscope could not be passed  in the bladder because of meatal stenosis. The meatus was then dilated with  #30 [place] female sounds and then the cystoscope was inserted in the  bladder. The bladder mucosa is normal. There is no stone or tumor in the  bladder. The ureteral  orifices are in normal position and shape.   A cone tip catheter was then passed through the cystoscope into the right  ureteral orifice. Contrast was then injected through the cone tip catheter.  The ureter appears normal, there is no evidence of filling defect in the  ureter and the calices appear normal. There is no hydronephrosis. The cone  tip catheter was then removed.   A guidewire was passed through the cystoscope into the ureter. The  cystoscope was removed. A #6.5 French rigid ureteroscope was then passed in  the bladder and into the ureter. The ureteroscope was advanced without  difficulty up to the upper ureter but could not be passed in the renal  pelvis because of a kink in the upper ureter. The ureteroscope was then  removed. A ureteroscope access sheath was then passed over the guidewire and  advanced without difficulty up into the renal pelvis. The ureteroscope  access sheath was then removed. The ureteroscope was then passed again in  the ureter and advanced all the way up into the renal pelvis without  difficulty.  There is no evidence of stone in the ureter nor in the renal  pelvis. The ureteroscope was then removed. The guidewire was then backloaded  into the cystoscope and a #6 [place] - 24 double-J catheter was passed over  the guidewire. The proximal  curl of the double-J catheter is in the renal pelvis. The distal curl is in  the bladder. The bladder was then emptied and the cystoscope and guidewire  were removed.   The patient tolerated the procedure well and left the OR in satisfactory  condition to post anesthesia care unit.       MN/MEDQ  D:  12/16/2004  T:  12/16/2004  Job:  [id number]

## 2010-11-08 ENCOUNTER — Encounter: Payer: Self-pay | Admitting: Obstetrics & Gynecology

## 2011-03-20 LAB — CBC
HCT: 30.8 — ABNORMAL LOW
HCT: 31.5 — ABNORMAL LOW
HCT: 35.7 — ABNORMAL LOW
Hemoglobin: 10.4 — ABNORMAL LOW
Hemoglobin: 12
Hemoglobin: 12.7
MCHC: 33.5
MCHC: 33.8
MCV: 91.1
MCV: 91.7
Platelets: 202
Platelets: 219
RBC: 3.43 — ABNORMAL LOW
RBC: 4.19
RDW: 13.8
WBC: 18.9 — ABNORMAL HIGH
WBC: 6
WBC: 8.1
WBC: 9.8

## 2011-03-20 LAB — DIFFERENTIAL
Lymphocytes Relative: 4 — ABNORMAL LOW
Lymphs Abs: 0.7
Monocytes Absolute: 0.8
Monocytes Relative: 4
Neutro Abs: 17.3 — ABNORMAL HIGH
Neutrophils Relative %: 92 — ABNORMAL HIGH

## 2011-03-20 LAB — URINE MICROSCOPIC-ADD ON

## 2011-03-20 LAB — HEPATIC FUNCTION PANEL
Albumin: 4
Alkaline Phosphatase: 56
Bilirubin, Direct: <0.1
Total Bilirubin: 0.7

## 2011-03-20 LAB — BASIC METABOLIC PANEL
CO2: 23
Calcium: 9.1
Creatinine, Ser: 0.71
GFR calc Af Amer: >60
Sodium: 138

## 2011-03-20 LAB — URINALYSIS, ROUTINE W REFLEX MICROSCOPIC
Bilirubin Urine: NEGATIVE
Glucose, UA: NEGATIVE
Specific Gravity, Urine: 1.026
Urobilinogen, UA: 1
pH: 5.5

## 2011-03-28 LAB — I-STAT 8, (EC8 V) (CONVERTED LAB)
Bicarbonate: 22.9
Glucose, Bld: 114 — ABNORMAL HIGH
TCO2: 24
pCO2, Ven: 34 — ABNORMAL LOW
pH, Ven: 7.436 — ABNORMAL HIGH

## 2011-04-05 LAB — URINE MICROSCOPIC-ADD ON

## 2011-04-05 LAB — URINALYSIS, ROUTINE W REFLEX MICROSCOPIC
Bilirubin Urine: NEGATIVE
Glucose, UA: NEGATIVE
Ketones, ur: NEGATIVE
Leukocytes, UA: NEGATIVE
Nitrite: NEGATIVE
Nitrite: NEGATIVE
Specific Gravity, Urine: 1.025
Specific Gravity, Urine: >1.03 — ABNORMAL HIGH
pH: 6
pH: 7

## 2011-04-05 LAB — PREGNANCY, URINE
Preg Test, Ur: NEGATIVE
Preg Test, Ur: NEGATIVE

## 2012-09-03 ENCOUNTER — Emergency Department (INDEPENDENT_AMBULATORY_CARE_PROVIDER_SITE_OTHER)
Admission: EM | Admit: 2012-09-03 | Discharge: 2012-09-03 | Disposition: A | Discharge status: Home / Self Care | Payer: Self-pay | Source: Home / Self Care

## 2012-09-03 NOTE — ED Provider Notes (Signed)
Medical screening examination/treatment/procedure(s) were performed by resident physician or non-physician practitioner and as supervising physician I was immediately available for consultation/collaboration.   KINDL,JAMES DOUGLAS MD.   James D Kindl, MD 09/03/12 1154 
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
## 2012-10-19 ENCOUNTER — Emergency Department (HOSPITAL_COMMUNITY)
Admission: EM | Admit: 2012-10-19 | Discharge: 2012-10-19 | Disposition: A | Discharge status: Home / Self Care | Payer: Self-pay | Attending: Emergency Medicine | Admitting: Emergency Medicine

## 2012-10-19 ENCOUNTER — Encounter (HOSPITAL_COMMUNITY): Payer: Self-pay | Admitting: *Deleted

## 2012-10-19 DIAGNOSIS — R51 Headache: Secondary | ICD-10-CM | POA: Insufficient documentation

## 2012-10-19 DIAGNOSIS — R112 Nausea with vomiting, unspecified: Secondary | ICD-10-CM | POA: Insufficient documentation

## 2012-10-19 MED ORDER — ONDANSETRON 4 MG PO TBDP
4.0000 mg | ORAL_TABLET | Freq: Once | ORAL | Status: AC
  Administered 2012-10-19: 4 mg via ORAL
  Filled 2012-10-19: qty 1

## 2012-10-19 MED ORDER — NAPROXEN 500 MG PO TABS: 500.0000 mg | ORAL_TABLET | Freq: Two times a day (BID) | ORAL | Status: DC

## 2012-10-19 MED ORDER — KETOROLAC TROMETHAMINE 60 MG/2ML IM SOLN
60.0000 mg | Freq: Once | INTRAMUSCULAR | Status: AC
  Administered 2012-10-19: 60 mg via INTRAMUSCULAR
  Filled 2012-10-19: qty 2

## 2012-10-19 MED ORDER — DIPHENHYDRAMINE HCL 50 MG/ML IJ SOLN
25.0000 mg | Freq: Once | INTRAMUSCULAR | Status: AC
  Administered 2012-10-19: 25 mg via INTRAMUSCULAR
  Filled 2012-10-19 (×2): qty 1

## 2012-10-19 MED ORDER — METOCLOPRAMIDE HCL 5 MG/ML IJ SOLN
10.0000 mg | Freq: Once | INTRAMUSCULAR | Status: AC
  Administered 2012-10-19: 10 mg via INTRAMUSCULAR
  Filled 2012-10-19: qty 2

## 2012-10-19 NOTE — ED Notes (Signed)
Pt reports hx of headaches. Headache started this morning 9/10. Vomited x3 due to headache. Reports sensitivity to light and sound.

## 2012-10-19 NOTE — ED Notes (Signed)
Pt escorted to discharge window. Verbalized understanding discharge instructions. In no acute distress.  Vitals WDL.  

## 2012-10-19 NOTE — ED Provider Notes (Signed)
History     CSN: [id number]  Arrival date & time 10/19/12  0906   First MD Initiated Contact with Patient 10/19/12 [phone]      Chief Complaint  Patient presents with  . Migraine  . Emesis    (Consider location/radiation/quality/duration/timing/severity/associated sxs/prior treatment) HPI Comments: 27 year old female with a history of intermittent headaches over the last several years and as a child. The headaches are all located in the same area which is the frontal area and behind the eyes. It feels like a tight sensation associated with throbbing. Nothing seems to make this better, worse with exposure to bright lights and light, sounds. There is no fevers or chills but there is nausea and vomiting. There is no stiffness in the neck, no weakness numbness or difficulty ambulating and no changes in vision.  The headache is persistent. 9/10 at this time. Again she states this feels like her normal headaches but did not get better with ibuprofen.  Patient is a 27 y.o. female presenting with migraines and vomiting. The history is provided by the patient and a relative.  Migraine  Emesis   History reviewed. No pertinent past medical history.  Past Surgical History  Procedure Laterality Date  . Renal artery stent      placed and removed in 2005  . Cesarean section      Nov 2011    History reviewed. No pertinent family history.  History  Substance Use Topics  . Smoking status: Never Smoker   . Smokeless tobacco: Not on file  . Alcohol Use: No    OB History   Grav Para Term Preterm Abortions TAB SAB Ect Mult Living                  Review of Systems  Gastrointestinal: Positive for vomiting.  All other systems reviewed and are negative.    Allergies  Ciprofloxacin and Erythromycin base  Home Medications   Current Outpatient Rx  Name  Route  Sig  Dispense  Refill  . naproxen (NAPROSYN) 500 MG tablet   Oral   Take 1 tablet (500 mg total) by mouth 2 (two) times daily  with a meal.   30 tablet   0     BP 101/82  Pulse 85  Temp(Src) 98 F (36.7 C) (Oral)  Resp 16  SpO2 100%  Physical Exam  Nursing note and vitals reviewed. Constitutional: She appears well-developed and well-nourished. No distress.  HENT:  Head: Normocephalic and atraumatic.  Mouth/Throat: Oropharynx is clear and moist. No oropharyngeal exudate.  Eyes: Conjunctivae and EOM are normal. Pupils are equal, round, and reactive to light. Right eye exhibits no discharge. Left eye exhibits no discharge. No scleral icterus.  Neck: Normal range of motion. Neck supple. No JVD present. No thyromegaly present.  Cardiovascular: Normal rate, regular rhythm, normal heart sounds and intact distal pulses.  Exam reveals no gallop and no friction rub.   No murmur heard. Pulmonary/Chest: Effort normal and breath sounds normal. No respiratory distress. She has no wheezes. She has no rales.  Abdominal: Soft. Bowel sounds are normal. She exhibits no distension and no mass. There is no tenderness.  Musculoskeletal: Normal range of motion. She exhibits no edema and no tenderness.  Lymphadenopathy:    She has no cervical adenopathy.  Neurological: She is alert. Coordination normal.  Normal speech, normal gait, normal strength and sensation, cranial nerves III through XII are intact, memory is intact, judgment and insight are normal.  Skin: Skin is warm  and dry. No rash noted. No erythema.  Psychiatric: She has a normal mood and affect. Her behavior is normal.    ED Course  Procedures (including critical care time)  Labs Reviewed - No data to display No results found.   1. Headache       MDM  The patient has normal vital signs, no stiff neck, normal neurologic exam and no signs of sinusitis. The patient will need to have treatment with intramuscular medications, reevaluation. I suspect this is primary headache either tension or migraine, otherwise patient is a benign-appearing.   12:10 PM - HA  now gone, feels great, wants to go.  Meds given in ED:  Medications  ketorolac (TORADOL) injection 60 mg (60 mg Intramuscular Given 10/19/12 1004)  metoCLOPramide (REGLAN) injection 10 mg (10 mg Intramuscular Given 10/19/12 1004)  diphenhydrAMINE (BENADRYL) injection 25 mg (25 mg Intramuscular Given 10/19/12 1004)  ondansetron (ZOFRAN-ODT) disintegrating tablet 4 mg (4 mg Oral Given 10/19/12 1003)    New Prescriptions   NAPROXEN (NAPROSYN) 500 MG TABLET    Take 1 tablet (500 mg total) by mouth 2 (two) times daily with a meal.      Vida Roller, MD 10/19/12 1212

## 2012-11-14 ENCOUNTER — Encounter (HOSPITAL_COMMUNITY): Payer: Self-pay | Admitting: Nurse Practitioner

## 2012-11-14 ENCOUNTER — Emergency Department (HOSPITAL_COMMUNITY)
Admission: EM | Admit: 2012-11-14 | Discharge: 2012-11-14 | Disposition: A | Discharge status: Home / Self Care | Payer: Self-pay | Attending: Emergency Medicine | Admitting: Emergency Medicine

## 2012-11-14 DIAGNOSIS — R000 Tachycardia, unspecified: Secondary | ICD-10-CM | POA: Insufficient documentation

## 2012-11-14 DIAGNOSIS — Z87442 Personal history of urinary calculi: Secondary | ICD-10-CM | POA: Insufficient documentation

## 2012-11-14 DIAGNOSIS — G43909 Migraine, unspecified, not intractable, without status migrainosus: Secondary | ICD-10-CM | POA: Insufficient documentation

## 2012-11-14 DIAGNOSIS — R6883 Chills (without fever): Secondary | ICD-10-CM | POA: Insufficient documentation

## 2012-11-14 DIAGNOSIS — N200 Calculus of kidney: Secondary | ICD-10-CM | POA: Insufficient documentation

## 2012-11-14 DIAGNOSIS — Z7982 Long term (current) use of aspirin: Secondary | ICD-10-CM | POA: Insufficient documentation

## 2012-11-14 DIAGNOSIS — N390 Urinary tract infection, site not specified: Secondary | ICD-10-CM | POA: Insufficient documentation

## 2012-11-14 HISTORY — DX: Calculus of kidney: N20.0

## 2012-11-14 LAB — URINALYSIS, ROUTINE W REFLEX MICROSCOPIC
Bilirubin Urine: NEGATIVE
Specific Gravity, Urine: 1.014 (ref 1.005–1.030)
pH: 7 (ref 5.0–8.0)

## 2012-11-14 LAB — COMPREHENSIVE METABOLIC PANEL
ALT: 13 U/L (ref 0–35)
AST: 17 U/L (ref 0–37)
CO2: 18 mEq/L — ABNORMAL LOW (ref 19–32)
Calcium: 8.6 mg/dL (ref 8.4–10.5)
Sodium: 137 mEq/L (ref 135–145)
Total Protein: 7.1 g/dL (ref 6.0–8.3)

## 2012-11-14 LAB — CBC WITH DIFFERENTIAL/PLATELET
Basophils Absolute: 0 10*3/uL (ref 0.0–0.1)
Eosinophils Absolute: 0 10*3/uL (ref 0.0–0.7)
Eosinophils Relative: 0 % (ref 0–5)
Lymphocytes Relative: 2 % — ABNORMAL LOW (ref 12–46)
MCV: 82.7 fL (ref 78.0–100.0)
Neutrophils Relative %: 94 % — ABNORMAL HIGH (ref 43–77)
Platelets: 155 10*3/uL (ref 150–400)
RDW: 15.2 % (ref 11.5–15.5)
WBC: 15.2 10*3/uL — ABNORMAL HIGH (ref 4.0–10.5)

## 2012-11-14 LAB — POCT PREGNANCY, URINE: Preg Test, Ur: NEGATIVE

## 2012-11-14 LAB — CG4 I-STAT (LACTIC ACID): Lactic Acid, Venous: 1.61 mmol/L (ref 0.5–2.2)

## 2012-11-14 LAB — URINE MICROSCOPIC-ADD ON

## 2012-11-14 MED ORDER — ONDANSETRON 4 MG PO TBDP
8.0000 mg | ORAL_TABLET | Freq: Once | ORAL | Status: AC
  Administered 2012-11-14: 8 mg via ORAL
  Filled 2012-11-14: qty 2

## 2012-11-14 MED ORDER — CEFTRIAXONE SODIUM 1 G IJ SOLR
1.0000 g | Freq: Once | INTRAMUSCULAR | Status: AC
  Administered 2012-11-14: 1 g via INTRAMUSCULAR
  Filled 2012-11-14: qty 10

## 2012-11-14 MED ORDER — PROMETHAZINE HCL 25 MG PO TABS: 25.0000 mg | ORAL_TABLET | Freq: Four times a day (QID) | ORAL | Status: DC | PRN

## 2012-11-14 MED ORDER — CEPHALEXIN 500 MG PO CAPS: 500.0000 mg | ORAL_CAPSULE | Freq: Four times a day (QID) | ORAL | Status: DC

## 2012-11-14 MED ORDER — KETOROLAC TROMETHAMINE 60 MG/2ML IM SOLN
60.0000 mg | Freq: Once | INTRAMUSCULAR | Status: AC
  Administered 2012-11-14: 60 mg via INTRAMUSCULAR
  Filled 2012-11-14: qty 2

## 2012-11-14 MED ORDER — LIDOCAINE HCL (PF) 1 % IJ SOLN
INTRAMUSCULAR | Status: AC
  Administered 2012-11-14: 2.1 mL
  Filled 2012-11-14: qty 5

## 2012-11-14 NOTE — ED Provider Notes (Signed)
History     CSN: [id number]  Arrival date & time 11/14/12  1048   First MD Initiated Contact with Patient 11/14/12 1128      Chief Complaint  Patient presents with  . Emesis    (Consider location/radiation/quality/duration/timing/severity/associated sxs/prior treatment) HPI Comments: Pt is a 27 y/of female with hx of frequent kidney stones and migraines who developed acute onset of facial flushing adn headache assocaited with chills and nassuea and vomiting last night.  These sx were intermittent - went away and she was able to sleep all night without problems - awoke this AM and had recurrent sx with some difficulty with nausea.  She denies dysuria but admits to having passed 3 kidney stones yesterday.   She has no coughing, no stiff neck, mild headache and no rashes.    Patient is a 27 y.o. female presenting with vomiting. The history is provided by the patient and medical records.  Emesis   Past Medical History  Diagnosis Date  . Kidney stones     Past Surgical History  Procedure Laterality Date  . Renal artery stent      placed and removed in 2005  . Cesarean section      Nov 2011    History reviewed. No pertinent family history.  History  Substance Use Topics  . Smoking status: Never Smoker   . Smokeless tobacco: Not on file  . Alcohol Use: No    OB History   Grav Para Term Preterm Abortions TAB SAB Ect Mult Living                  Review of Systems  Gastrointestinal: Positive for vomiting.  All other systems reviewed and are negative.    Allergies  Ciprofloxacin and Erythromycin base  Home Medications   Current Outpatient Rx  Name  Route  Sig  Dispense  Refill  . aspirin 325 MG tablet   Oral   Take 325 mg by mouth daily.         Marland Kitchen etonogestrel (IMPLANON) 68 MG IMPL implant   Subcutaneous   Inject 1 each into the skin once.         . naproxen sodium (ANAPROX) 220 MG tablet   Oral   Take 440 mg by mouth 2 (two) times daily with a meal.         . cephALEXin (KEFLEX) 500 MG capsule   Oral   Take 1 capsule (500 mg total) by mouth 4 (four) times daily.   28 capsule   0   . promethazine (PHENERGAN) 25 MG tablet   Oral   Take 1 tablet (25 mg total) by mouth every 6 (six) hours as needed for nausea.   12 tablet   0     BP 99/65  Pulse 93  Temp(Src) 98 F (36.7 C) (Oral)  Resp 15  Ht 5' (1.524 m)  Wt 185 lb (83.915 kg)  BMI 36.13 kg/m2  SpO2 94%  Physical Exam  Nursing note and vitals reviewed. Constitutional: She appears well-developed and well-nourished. No distress.  HENT:  Head: Normocephalic and atraumatic.  Mouth/Throat: Oropharynx is clear and moist. No oropharyngeal exudate.  No ttp over the sinuses, normal OP, MMM  Eyes: Conjunctivae and EOM are normal. Pupils are equal, round, and reactive to light. Right eye exhibits no discharge. Left eye exhibits no discharge. No scleral icterus.  Neck: Normal range of motion. Neck supple. No JVD present. No thyromegaly present.  Supple, no LAD, no thyromegaly  Cardiovascular: Regular rhythm, normal heart sounds and intact distal pulses.  Exam reveals no gallop and no friction rub.   No murmur heard. Mild tachycardia  Pulmonary/Chest: Effort normal and breath sounds normal. No respiratory distress. She has no wheezes. She has no rales.  Abdominal: Soft. Bowel sounds are normal. She exhibits no distension and no mass. There is no tenderness.  Musculoskeletal: Normal range of motion. She exhibits no edema and no tenderness.  Lymphadenopathy:    She has no cervical adenopathy.  Neurological: She is alert. Coordination normal.  Skin: Skin is warm and dry. No rash noted. No erythema.  Psychiatric: She has a normal mood and affect. Her behavior is normal.    ED Course  Procedures (including critical care time)  Labs Reviewed  CBC WITH DIFFERENTIAL - Abnormal; Notable for the following:    WBC 15.2 (*)    Hemoglobin 11.5 (*)    HCT 34.0 (*)    Neutrophils  Relative % 94 (*)    Neutro Abs 14.3 (*)    Lymphocytes Relative 2 (*)    Lymphs Abs 0.3 (*)    All other components within normal limits  COMPREHENSIVE METABOLIC PANEL - Abnormal; Notable for the following:    Potassium 3.0 (*)    CO2 18 (*)    Albumin 3.3 (*)    All other components within normal limits  URINALYSIS, ROUTINE W REFLEX MICROSCOPIC - Abnormal; Notable for the following:    APPearance TURBID (*)    Hgb urine dipstick LARGE (*)    Ketones, ur 40 (*)    Protein, ur 100 (*)    Urobilinogen, UA 4.0 (*)    Nitrite POSITIVE (*)    Leukocytes, UA LARGE (*)    All other components within normal limits  URINE MICROSCOPIC-ADD ON - Abnormal; Notable for the following:    Bacteria, UA MANY (*)    All other components within normal limits  URINE CULTURE  LIPASE, BLOOD  POCT PREGNANCY, URINE  CG4 I-STAT (LACTIC ACID)   No results found.   1. UTI (lower urinary tract infection)       MDM  Pt is benign in appearance at this time - she has some tachycardia though it has improved since triage - she feels that this is likely her prior migraine like headache and has no obviosu signs of other acute processes at this time.  She is not febrile, has clear lngs and soft NT abd.  UA and labs as ordered by nursing pending.  Antiemetics given with improvement.  Blood counts elevated at 15,000, lactic acid normal, nonpregnant, normal metabolic panel other than mild hypokalemia. The patient has been able to tolerate fluids very well,  Has been givenToradol, Zofran and Rocephin intramuscular and has done very well, has no pain or nausea and heart rate is down to 100. Her urinalysis shows that she does have a urinary tract infection, culture ordered, home on antibiotics, followup list given, patient stable and understands her discharge instructions.   Meds given in ED:  Medications  cefTRIAXone (ROCEPHIN) injection 1 g (not administered)  ondansetron (ZOFRAN-ODT) disintegrating tablet 8  mg (8 mg Oral Given 11/14/12 1106)  ketorolac (TORADOL) injection 60 mg (60 mg Intramuscular Given 11/14/12 1201)    New Prescriptions   CEPHALEXIN (KEFLEX) 500 MG CAPSULE    Take 1 capsule (500 mg total) by mouth 4 (four) times daily.   PROMETHAZINE (PHENERGAN) 25 MG TABLET    Take 1 tablet (25 mg total) by  mouth every 6 (six) hours as needed for nausea.          Vida Roller, MD 11/14/12 [phone]

## 2012-11-14 NOTE — ED Notes (Signed)
N/v/chills onset last night. Unable to tolerate oral intake. Denies any pain

## 2012-11-15 ENCOUNTER — Encounter (HOSPITAL_COMMUNITY): Payer: Self-pay | Admitting: *Deleted

## 2012-11-15 ENCOUNTER — Emergency Department (HOSPITAL_COMMUNITY)
Admission: EM | Admit: 2012-11-15 | Discharge: 2012-11-16 | Disposition: A | Discharge status: Home / Self Care | Payer: Self-pay | Attending: Emergency Medicine | Admitting: Emergency Medicine

## 2012-11-15 DIAGNOSIS — R509 Fever, unspecified: Secondary | ICD-10-CM | POA: Insufficient documentation

## 2012-11-15 DIAGNOSIS — E876 Hypokalemia: Secondary | ICD-10-CM | POA: Insufficient documentation

## 2012-11-15 DIAGNOSIS — N390 Urinary tract infection, site not specified: Secondary | ICD-10-CM | POA: Insufficient documentation

## 2012-11-15 DIAGNOSIS — R300 Dysuria: Secondary | ICD-10-CM | POA: Insufficient documentation

## 2012-11-15 DIAGNOSIS — Z87442 Personal history of urinary calculi: Secondary | ICD-10-CM | POA: Insufficient documentation

## 2012-11-15 DIAGNOSIS — R112 Nausea with vomiting, unspecified: Secondary | ICD-10-CM | POA: Insufficient documentation

## 2012-11-15 LAB — CBC WITH DIFFERENTIAL/PLATELET
Basophils Absolute: 0 10*3/uL (ref 0.0–0.1)
Basophils Relative: 0 % (ref 0–1)
Eosinophils Relative: 0 % (ref 0–5)
HCT: 32 % — ABNORMAL LOW (ref 36.0–46.0)
Lymphocytes Relative: 8 % — ABNORMAL LOW (ref 12–46)
MCHC: 32.5 g/dL (ref 30.0–36.0)
Monocytes Absolute: 1.3 10*3/uL — ABNORMAL HIGH (ref 0.1–1.0)
Neutro Abs: 8.5 10*3/uL — ABNORMAL HIGH (ref 1.7–7.7)
Platelets: 154 10*3/uL (ref 150–400)
RDW: 15.1 % (ref 11.5–15.5)
WBC: 10.7 10*3/uL — ABNORMAL HIGH (ref 4.0–10.5)

## 2012-11-15 LAB — POCT I-STAT, CHEM 8
BUN: 4 mg/dL — ABNORMAL LOW (ref 6–23)
Calcium, Ion: 1.15 mmol/L (ref 1.12–1.23)
Chloride: 103 mEq/L (ref 96–112)
Creatinine, Ser: 0.9 mg/dL (ref 0.50–1.10)
Glucose, Bld: 137 mg/dL — ABNORMAL HIGH (ref 70–99)
HCT: 32 % — ABNORMAL LOW (ref 36.0–46.0)
Hemoglobin: 10.9 g/dL — ABNORMAL LOW (ref 12.0–15.0)
Potassium: 2.8 mEq/L — ABNORMAL LOW (ref 3.5–5.1)
Sodium: 137 mEq/L (ref 135–145)
TCO2: 22 mmol/L (ref 0–100)

## 2012-11-15 MED ORDER — ONDANSETRON HCL 4 MG/2ML IJ SOLN
4.0000 mg | Freq: Once | INTRAMUSCULAR | Status: AC
  Administered 2012-11-15: 4 mg via INTRAVENOUS
  Filled 2012-11-15: qty 2

## 2012-11-15 MED ORDER — SODIUM CHLORIDE 0.9 % IV BOLUS (SEPSIS)
1000.0000 mL | Freq: Once | INTRAVENOUS | Status: AC
  Administered 2012-11-15: 1000 mL via INTRAVENOUS

## 2012-11-15 MED ORDER — POTASSIUM CHLORIDE CRYS ER 20 MEQ PO TBCR
40.0000 meq | EXTENDED_RELEASE_TABLET | Freq: Two times a day (BID) | ORAL | Status: DC
  Administered 2012-11-15: 40 meq via ORAL
  Filled 2012-11-15: qty 2

## 2012-11-15 MED ORDER — POTASSIUM CHLORIDE 10 MEQ/100ML IV SOLN
10.0000 meq | INTRAVENOUS | Status: AC
  Administered 2012-11-15 (×2): 10 meq via INTRAVENOUS
  Filled 2012-11-15 (×2): qty 100

## 2012-11-15 NOTE — ED Provider Notes (Signed)
History     CSN: [id number]  Arrival date & time 11/15/12  2048   First MD Initiated Contact with Patient 11/15/12 2129      Chief Complaint  Patient presents with  . Nausea    (Consider location/radiation/quality/duration/timing/severity/associated sxs/prior treatment) HPI Comments: Dx yesterday with UTI DC home with Rx for Keflex and Phenergan but unable to take due to nausea and has had chills at home for which she had not taken any meds   The history is provided by the patient.    Past Medical History  Diagnosis Date  . Kidney stones     Past Surgical History  Procedure Laterality Date  . Renal artery stent      placed and removed in 2005  . Cesarean section      Nov 2011    History reviewed. No pertinent family history.  History  Substance Use Topics  . Smoking status: Never Smoker   . Smokeless tobacco: Not on file  . Alcohol Use: No    OB History   Grav Para Term Preterm Abortions TAB SAB Ect Mult Living                  Review of Systems  Constitutional: Positive for fever and chills.  HENT: Negative for congestion and rhinorrhea.   Respiratory: Negative for cough and shortness of breath.   Gastrointestinal: Positive for nausea and vomiting. Negative for abdominal pain.  Genitourinary: Positive for dysuria. Negative for flank pain and vaginal discharge.  Musculoskeletal: Negative for myalgias.  Skin: Negative for rash and wound.  Neurological: Negative for dizziness and weakness.  All other systems reviewed and are negative.    Allergies  Ciprofloxacin and Erythromycin base  Home Medications   Current Outpatient Rx  Name  Route  Sig  Dispense  Refill  . cephALEXin (KEFLEX) 500 MG capsule   Oral   Take 1 capsule (500 mg total) by mouth 4 (four) times daily.   28 capsule   0   . promethazine (PHENERGAN) 25 MG tablet   Oral   Take 1 tablet (25 mg total) by mouth every 6 (six) hours as needed for nausea.   12 tablet   0   .  etonogestrel (IMPLANON) 68 MG IMPL implant   Subcutaneous   Inject 1 each into the skin once.           BP 109/68  Pulse 108  Temp(Src) 103.7 F (39.8 C) (Rectal)  Resp 18  SpO2 100%  LMP 10/24/2012  Physical Exam  Nursing note and vitals reviewed. Constitutional: She is oriented to person, place, and time. She appears well-developed and well-nourished.  HENT:  Head: Normocephalic and atraumatic.  Eyes: Pupils are equal, round, and reactive to light.  Neck: Normal range of motion.  Cardiovascular: Normal rate and regular rhythm.   Pulmonary/Chest: Effort normal.  Abdominal: Soft. Bowel sounds are normal. She exhibits no distension.  Musculoskeletal: Normal range of motion.  Neurological: She is alert and oriented to person, place, and time.  Skin: Skin is warm. No rash noted.    ED Course  Procedures (including critical care time)  Labs Reviewed  CBC WITH DIFFERENTIAL - Abnormal; Notable for the following:    WBC 10.7 (*)    Hemoglobin 10.4 (*)    HCT 32.0 (*)    Neutrophils Relative % 80 (*)    Neutro Abs 8.5 (*)    Lymphocytes Relative 8 (*)    Monocytes Absolute 1.3 (*)  All other components within normal limits  POCT I-STAT, CHEM 8 - Abnormal; Notable for the following:    Potassium 2.8 (*)    BUN 4 (*)    Glucose, Bld 137 (*)    Hemoglobin 10.9 (*)    HCT 32.0 (*)    All other components within normal limits   No results found.   1. UTI (lower urinary tract infection)   2. Hypokalemia   3. Nausea and vomiting       MDM   On recheck patient feeling better up to BR  Will give additional IV fluids supplement her K+ both PO and IV   Patient is passed a fluid challenge, eating mussels, and drinking ginger ale.  She will be discharged home to continue taking her antibiotics for UTI      Arman Filter, NP 11/16/12 0159

## 2012-11-15 NOTE — ED Notes (Signed)
Pt c/o continued and worsening nausea since yesterday. Pt dx'd w/ UTI yesterday. Pt has taken 3 doses of keflex which was rx'd for her. Pt states phenergan not relieving nausea. Pt denies vomiting, but admits to decreased intake secondary to nausea. Pt's mucous membranes dry, pt is pale.

## 2012-11-16 LAB — URINE CULTURE

## 2012-11-17 ENCOUNTER — Telehealth (HOSPITAL_COMMUNITY): Payer: Self-pay | Admitting: Emergency Medicine

## 2012-11-17 NOTE — ED Notes (Signed)
Post ED Visit - Positive Culture Follow-up  Culture report reviewed by antimicrobial stewardship pharmacist: []  Wes Dulaney, Pharm.D., BCPS []  Celedonio Miyamoto, Pharm.D., BCPS []  Georgina Pillion, Pharm.D., BCPS []  [place], [place].D., BCPS, AAHIVP [x]  Estella Husk, Pharm.D., BCPS, AAHIVP  Positive urine culture Treated with Keflex, organism sensitive to the same and no further patient follow-up is required at this time.  Kylie A Holland 11/17/2012, 5:07 PM

## 2012-11-20 NOTE — ED Provider Notes (Signed)
Medical screening examination/treatment/procedure(s) were performed by non-physician practitioner and as supervising physician I was immediately available for consultation/collaboration.  Brea Coleson, MD 11/20/12 2239 

## 2013-01-18 ENCOUNTER — Encounter (HOSPITAL_COMMUNITY): Payer: Self-pay | Admitting: *Deleted

## 2013-01-18 ENCOUNTER — Emergency Department (HOSPITAL_COMMUNITY)
Admission: EM | Admit: 2013-01-18 | Discharge: 2013-01-18 | Disposition: A | Discharge status: Home / Self Care | Payer: Self-pay | Attending: Emergency Medicine | Admitting: Emergency Medicine

## 2013-01-18 ENCOUNTER — Emergency Department (HOSPITAL_COMMUNITY): Payer: Self-pay

## 2013-01-18 DIAGNOSIS — R110 Nausea: Secondary | ICD-10-CM | POA: Insufficient documentation

## 2013-01-18 DIAGNOSIS — Z79899 Other long term (current) drug therapy: Secondary | ICD-10-CM | POA: Insufficient documentation

## 2013-01-18 DIAGNOSIS — Z87442 Personal history of urinary calculi: Secondary | ICD-10-CM | POA: Insufficient documentation

## 2013-01-18 DIAGNOSIS — N390 Urinary tract infection, site not specified: Secondary | ICD-10-CM | POA: Insufficient documentation

## 2013-01-18 LAB — POCT I-STAT, CHEM 8
Calcium, Ion: 1.08 mmol/L — ABNORMAL LOW (ref 1.12–1.23)
HCT: 33 % — ABNORMAL LOW (ref 36.0–46.0)
Hemoglobin: 11.2 g/dL — ABNORMAL LOW (ref 12.0–15.0)
Sodium: 136 mEq/L (ref 135–145)
TCO2: 19 mmol/L (ref 0–100)

## 2013-01-18 LAB — URINALYSIS, ROUTINE W REFLEX MICROSCOPIC
Ketones, ur: 15 mg/dL — AB
Nitrite: POSITIVE — AB
Protein, ur: 30 mg/dL — AB

## 2013-01-18 LAB — URINE MICROSCOPIC-ADD ON

## 2013-01-18 MED ORDER — DEXTROSE 5 % IV SOLN
1.0000 g | INTRAVENOUS | Status: DC
  Administered 2013-01-18: 1 g via INTRAVENOUS
  Filled 2013-01-18: qty 10

## 2013-01-18 MED ORDER — ONDANSETRON HCL 4 MG/2ML IJ SOLN
4.0000 mg | Freq: Once | INTRAMUSCULAR | Status: AC
  Administered 2013-01-18: 4 mg via INTRAVENOUS
  Filled 2013-01-18: qty 2

## 2013-01-18 MED ORDER — SODIUM CHLORIDE 0.9 % IV SOLN
1000.0000 mL | Freq: Once | INTRAVENOUS | Status: AC
  Administered 2013-01-18: 1000 mL via INTRAVENOUS

## 2013-01-18 MED ORDER — HYDROCODONE-ACETAMINOPHEN 5-325 MG PO TABS: 1.0000 | ORAL_TABLET | Freq: Four times a day (QID) | ORAL | Status: DC | PRN

## 2013-01-18 MED ORDER — ONDANSETRON 8 MG PO TBDP: 8.0000 mg | ORAL_TABLET | Freq: Three times a day (TID) | ORAL | Status: DC | PRN

## 2013-01-18 MED ORDER — SODIUM CHLORIDE 0.9 % IV SOLN: 1000.0000 mL | INTRAVENOUS | Status: DC

## 2013-01-18 MED ORDER — CEPHALEXIN 500 MG PO CAPS: 500.0000 mg | ORAL_CAPSULE | Freq: Four times a day (QID) | ORAL | Status: DC

## 2013-01-18 NOTE — ED Notes (Signed)
Pt states she felt febrile and nauseated last night, was not able to check temperature at home. States she took aspirin this morning.  Vomited once

## 2013-01-18 NOTE — ED Provider Notes (Signed)
CSN: [id number]     Arrival date & time 01/18/13  1817 History   First MD Initiated Contact with Patient 01/18/13 1849     Chief Complaint  Patient presents with  . Fever  . Nausea   HPI Pt presents to the ED with complaints of fevers and chills.  Symptoms started last night.  She felt chilled and thought she had a fever.  This morning the symptoms continued.  She has had a cough but denies hematuria or dysuria.  NO abdominal pain or back pain.  She did vomit once today.  She denies sore throat.  No rash.  No foreign travel.  No tick bites.  Past Medical History  Diagnosis Date  . Kidney stones   . Kidney stones    Past Surgical History  Procedure Laterality Date  . Renal artery stent      placed and removed in 2005  . Cesarean section      Nov 2011   No family history on file. History  Substance Use Topics  . Smoking status: Never Smoker   . Smokeless tobacco: Not on file  . Alcohol Use: No   OB History   Grav Para Term Preterm Abortions TAB SAB Ect Mult Living                 Review of Systems  All other systems reviewed and are negative.    Allergies  Ciprofloxacin and Erythromycin base  Home Medications   Current Outpatient Rx  Name  Route  Sig  Dispense  Refill  . aspirin 325 MG tablet   Oral   Take 650 mg by mouth once.         . cephALEXin (KEFLEX) 500 MG capsule   Oral   Take 1 capsule (500 mg total) by mouth 4 (four) times daily.   40 capsule   0   . etonogestrel (IMPLANON) 68 MG IMPL implant   Subcutaneous   Inject 1 each into the skin once.         Marland Kitchen HYDROcodone-acetaminophen (NORCO) 5-325 MG per tablet   Oral   Take 1-2 tablets by mouth every 6 (six) hours as needed for pain.   16 tablet   0   . ondansetron (ZOFRAN ODT) 8 MG disintegrating tablet   Oral   Take 1 tablet (8 mg total) by mouth every 8 (eight) hours as needed for nausea.   20 tablet   0    BP 113/77  Pulse 122  Temp(Src) 99.3 F (37.4 C) (Oral)  Resp 20  SpO2  99%  LMP 01/13/2013 Physical Exam  Nursing note and vitals reviewed. Constitutional: She appears well-developed and well-nourished. No distress.  HENT:  Head: Normocephalic and atraumatic.  Right Ear: External ear normal.  Left Ear: External ear normal.  Mouth/Throat: No oropharyngeal exudate.  Eyes: Conjunctivae are normal. Right eye exhibits no discharge. Left eye exhibits no discharge. No scleral icterus.  Neck: Neck supple. No tracheal deviation present.  Cardiovascular: Normal rate, regular rhythm and intact distal pulses.   Pulmonary/Chest: Effort normal and breath sounds normal. No stridor. No respiratory distress. She has no wheezes. She has no rales.  Abdominal: Soft. Bowel sounds are normal. She exhibits no distension. There is no tenderness. There is no rebound and no guarding.  Musculoskeletal: She exhibits no edema and no tenderness.  Lymphadenopathy:    She has no cervical adenopathy.  Neurological: She is alert. She has normal strength. No sensory deficit. Cranial nerve  deficit:  no gross defecits noted. She exhibits normal muscle tone. She displays no seizure activity. Coordination normal.  Skin: Skin is warm and dry. No rash noted.  Psychiatric: She has a normal mood and affect.    ED Course   Procedures (including critical care time)  Labs Reviewed  URINALYSIS, ROUTINE W REFLEX MICROSCOPIC - Abnormal; Notable for the following:    Color, Urine AMBER (*)    APPearance CLOUDY (*)    Hgb urine dipstick LARGE (*)    Ketones, ur 15 (*)    Protein, ur 30 (*)    Urobilinogen, UA 4.0 (*)    Nitrite POSITIVE (*)    Leukocytes, UA MODERATE (*)    All other components within normal limits  URINE MICROSCOPIC-ADD ON - Abnormal; Notable for the following:    Squamous Epithelial / LPF FEW (*)    Bacteria, UA MANY (*)    All other components within normal limits  POCT I-STAT, CHEM 8 - Abnormal; Notable for the following:    Glucose, Bld 111 (*)    Calcium, Ion 1.08 (*)     Hemoglobin 11.2 (*)    HCT 33.0 (*)    All other components within normal limits  URINE CULTURE   Dg Chest 2 View  01/18/2013   *RADIOLOGY REPORT*  Clinical Data: Fever  CHEST - 2 VIEW  Comparison: 05/12/2010  Findings: The heart and pulmonary vascularity are within normal limits.  The lungs are clear.  The bony structures are within normal limits.  IMPRESSION: No acute abnormality noted.   Original Report Authenticated By: Alcide Clever, M.D.   1. UTI (urinary tract infection)     MDM  Patient appears to have a  urinary tract infection. Her constitutional symptoms may be related to early pyelonephritis. Patient was given IV Rocephin. She will be discharged home on Keflex as well as Zofran and Norco. Patient is instructed to follow up with primary Dr. She understands return to emergency room for any worsening symptoms per  Celene Kras, MD 01/18/13 2150

## 2013-01-21 LAB — URINE CULTURE

## 2013-01-22 NOTE — ED Notes (Signed)
Adequately treated-per Emily West 

## 2013-01-29 ENCOUNTER — Encounter (HOSPITAL_COMMUNITY): Payer: Self-pay | Admitting: *Deleted

## 2013-01-29 ENCOUNTER — Inpatient Hospital Stay (HOSPITAL_COMMUNITY)
Admission: AD | Admit: 2013-01-29 | Discharge: 2013-01-29 | Disposition: A | Discharge status: Home / Self Care | Payer: Self-pay | Source: Ambulatory Visit | Attending: Obstetrics & Gynecology | Admitting: Obstetrics & Gynecology

## 2013-01-29 DIAGNOSIS — N949 Unspecified condition associated with female genital organs and menstrual cycle: Secondary | ICD-10-CM | POA: Insufficient documentation

## 2013-01-29 DIAGNOSIS — B3731 Acute candidiasis of vulva and vagina: Secondary | ICD-10-CM

## 2013-01-29 DIAGNOSIS — B373 Candidiasis of vulva and vagina: Secondary | ICD-10-CM

## 2013-01-29 DIAGNOSIS — L293 Anogenital pruritus, unspecified: Secondary | ICD-10-CM | POA: Insufficient documentation

## 2013-01-29 LAB — WET PREP, GENITAL

## 2013-01-29 LAB — URINALYSIS, ROUTINE W REFLEX MICROSCOPIC
Glucose, UA: NEGATIVE mg/dL
Ketones, ur: NEGATIVE mg/dL
Nitrite: NEGATIVE
Protein, ur: NEGATIVE mg/dL
Urobilinogen, UA: 0.2 mg/dL (ref 0.0–1.0)

## 2013-01-29 LAB — URINE MICROSCOPIC-ADD ON

## 2013-01-29 MED ORDER — NYSTATIN-TRIAMCINOLONE 100000-0.1 UNIT/GM-% EX CREA
TOPICAL_CREAM | CUTANEOUS | Status: AC
  Administered 2013-01-29: 22:00:00 via TOPICAL
  Filled 2013-01-29: qty 15

## 2013-01-29 MED ORDER — FLUCONAZOLE 150 MG PO TABS
150.0000 mg | ORAL_TABLET | ORAL | Status: AC
  Administered 2013-01-29: 150 mg via ORAL
  Filled 2013-01-29: qty 1

## 2013-01-29 NOTE — MAU Note (Signed)
Pt states she just finished antibiotic for UTI today, states she has white vaginal discharge and burning on the outside when she urinates. Painful intercourse today.

## 2013-01-29 NOTE — MAU Provider Note (Signed)
Chief Complaint: Vaginal Discharge   None    SUBJECTIVE HPI: [name] is a 27 y.o. G2P1011 who presents to maternity admissions reporting vaginal/external pain with intercourse x1 week and white vaginal discharge with itching x1 week.  She recently completed a course of abx for UTI.  She denies vaginal bleeding, odor with discharge, urinary symptoms, h/a, dizziness, n/v, or fever/chills.     Past Medical History  Diagnosis Date  . Kidney stones   . Kidney stones    Past Surgical History  Procedure Laterality Date  . Renal artery stent      placed and removed in 2005  . Cesarean section      Nov 2011   History   Social History  . Marital Status: Single    Spouse Name: N/A    Number of Children: N/A  . Years of Education: N/A   Occupational History  . Not on file.   Social History Main Topics  . Smoking status: Never Smoker   . Smokeless tobacco: Not on file  . Alcohol Use: No  . Drug Use: No  . Sexual Activity: Yes    Birth Control/ Protection: Implant   Other Topics Concern  . Not on file   Social History Narrative  . No narrative on file   No current facility-administered medications on file prior to encounter.   Current Outpatient Prescriptions on File Prior to Encounter  Medication Sig Dispense Refill  . aspirin 325 MG tablet Take 650 mg by mouth once.      . cephALEXin (KEFLEX) 500 MG capsule Take 1 capsule (500 mg total) by mouth 4 (four) times daily.  40 capsule  0  . etonogestrel (IMPLANON) 68 MG IMPL implant Inject 1 each into the skin once.       Allergies  Allergen Reactions  . Ciprofloxacin     REACTION: nausea and vomiting  . Erythromycin Base     REACTION: rash    ROS: Pertinent items in HPI  OBJECTIVE Blood pressure 134/74, pulse 76, resp. rate 18, height 5' 0.5" (1.537 m), weight 78.926 kg (174 lb), last menstrual period 01/13/2013, SpO2 100.00%. GENERAL: Well-developed, well-nourished female in no acute distress.  HEENT:  Normocephalic HEART: normal rate RESP: normal effort ABDOMEN: Soft, non-tender EXTREMITIES: Nontender, no edema NEURO: Alert and oriented Pelvic exam: Cervix pink, visually closed, without lesion, large amount white, thick, discharge with clumps clinging to vaginal walls and labia, vaginal walls and external genitalia with moderate erythema   LAB RESULTS Results for orders placed during the hospital encounter of 01/29/13 (from the past 24 hour(s))  URINALYSIS, ROUTINE W REFLEX MICROSCOPIC     Status: Abnormal   Collection Time    01/29/13  7:40 PM      Result Value Range   Color, Urine YELLOW  YELLOW   APPearance HAZY (*) CLEAR   Specific Gravity, Urine 1.020  1.005 - 1.030   pH 7.0  5.0 - 8.0   Glucose, UA NEGATIVE  NEGATIVE mg/dL   Hgb urine dipstick MODERATE (*) NEGATIVE   Bilirubin Urine NEGATIVE  NEGATIVE   Ketones, ur NEGATIVE  NEGATIVE mg/dL   Protein, ur NEGATIVE  NEGATIVE mg/dL   Urobilinogen, UA 0.2  0.0 - 1.0 mg/dL   Nitrite NEGATIVE  NEGATIVE   Leukocytes, UA TRACE (*) NEGATIVE  URINE MICROSCOPIC-ADD ON     Status: Abnormal   Collection Time    01/29/13  7:40 PM      Result Value Range  Squamous Epithelial / LPF FEW (*) RARE   WBC, UA 0-2  <3 WBC/hpf   RBC / HPF 3-6  <3 RBC/hpf   Urine-Other MUCOUS PRESENT     Wet prep pending   ASSESSMENT 1. Candidal vaginitis     PLAN Diflucan x1 dose in MAU, Topical Mycolog tube given in MAU Disscharge home F/U with gyn provider or return to MAU as needed  No current facility-administered medications on file prior to encounter.   Current Outpatient Prescriptions on File Prior to Encounter  Medication Sig Dispense Refill  . aspirin 325 MG tablet Take 650 mg by mouth once.      . cephALEXin (KEFLEX) 500 MG capsule Take 1 capsule (500 mg total) by mouth 4 (four) times daily.  40 capsule  0  . etonogestrel (IMPLANON) 68 MG IMPL implant Inject 1 each into the skin once.          Sharen Counter Certified  Nurse-Midwife 01/29/2013  9:06 PM

## 2013-01-30 LAB — GC/CHLAMYDIA PROBE AMP: GC Probe RNA: NEGATIVE

## 2013-06-13 ENCOUNTER — Inpatient Hospital Stay (HOSPITAL_COMMUNITY)
Admission: AD | Admit: 2013-06-13 | Discharge: 2013-06-13 | Disposition: A | Discharge status: Home / Self Care | Payer: Self-pay | Source: Ambulatory Visit | Attending: Obstetrics & Gynecology | Admitting: Obstetrics & Gynecology

## 2013-06-13 ENCOUNTER — Encounter (HOSPITAL_COMMUNITY): Payer: Self-pay | Admitting: *Deleted

## 2013-06-13 DIAGNOSIS — S40029A Contusion of unspecified upper arm, initial encounter: Secondary | ICD-10-CM | POA: Insufficient documentation

## 2013-06-13 DIAGNOSIS — Y929 Unspecified place or not applicable: Secondary | ICD-10-CM | POA: Insufficient documentation

## 2013-06-13 DIAGNOSIS — Z975 Presence of (intrauterine) contraceptive device: Secondary | ICD-10-CM | POA: Insufficient documentation

## 2013-06-13 DIAGNOSIS — Z3202 Encounter for pregnancy test, result negative: Secondary | ICD-10-CM | POA: Insufficient documentation

## 2013-06-13 DIAGNOSIS — T148XXA Other injury of unspecified body region, initial encounter: Secondary | ICD-10-CM

## 2013-06-13 DIAGNOSIS — Z3046 Encounter for surveillance of implantable subdermal contraceptive: Secondary | ICD-10-CM

## 2013-06-13 DIAGNOSIS — X58XXXA Exposure to other specified factors, initial encounter: Secondary | ICD-10-CM | POA: Insufficient documentation

## 2013-06-13 LAB — URINALYSIS, ROUTINE W REFLEX MICROSCOPIC
Bilirubin Urine: NEGATIVE
Ketones, ur: NEGATIVE mg/dL
Nitrite: NEGATIVE
Protein, ur: NEGATIVE mg/dL
Specific Gravity, Urine: >1.03 — ABNORMAL HIGH (ref 1.005–1.030)
Urobilinogen, UA: 0.2 mg/dL (ref 0.0–1.0)
pH: 6 (ref 5.0–8.0)

## 2013-06-13 LAB — POCT PREGNANCY, URINE: Preg Test, Ur: NEGATIVE

## 2013-06-13 NOTE — MAU Note (Signed)
Patient states she has an Implanon that should be taken out on 12-29 but does not have anyone to take it out. Was placed at [facility]  Had a light period on 12-15. Went to a hospital in [place] on 12-24 because the implanon site was tender and states the MD pushed on it and now is bruised. States she might be pregnant.

## 2013-06-13 NOTE — MAU Provider Note (Signed)
History     CSN: [id number]  Arrival date and time: 06/13/13 6578   First Provider Initiated Contact with Patient 06/13/13 1917      No chief complaint on file.  HPI This is a 27 y.o. female who presents with complaint of bruisingover the lower part of her Implanon on her right arm. This was placed 3 years ago. She went to the ER in [place] and after they pushed on it, she noted bruising. Her significant other wants it taken out, because he is worried the empty capsule will degenerate and cause her harm. They have not gotten it removed due to lack of insurance. Worried she is pregnant.  RN Note:  Patient states she has an Implanon that should be taken out on 12-29 but does not have anyone to take it out. Was placed at [facility] Had a light period on 12-15. Went to a hospital in [place] on 12-24 because the implanon site was tender and states the MD pushed on it and now is bruised. States she might be pregnant.       OB History   Grav Para Term Preterm Abortions TAB SAB Ect Mult Living   2 1 1  1  1   1       Past Medical History  Diagnosis Date  . Kidney stones   . Kidney stones     Past Surgical History  Procedure Laterality Date  . Renal artery stent      placed and removed in 2005  . Cesarean section      Nov 2011    Family History  Problem Relation Age of Onset  . Hypertension Mother   . Depression Mother   . Diabetes Father   . Hypertension Father   . Heart disease Father   . Cancer Father     History  Substance Use Topics  . Smoking status: Never Smoker   . Smokeless tobacco: Not on file  . Alcohol Use: No    Allergies:  Allergies  Allergen Reactions  . Ciprofloxacin     REACTION: nausea and vomiting  . Erythromycin Base     REACTION: rash    Prescriptions prior to admission  Medication Sig Dispense Refill  . aspirin 325 MG tablet Take 650 mg by mouth once.      . cephALEXin (KEFLEX) 500 MG capsule Take 1 capsule (500 mg total) by  mouth 4 (four) times daily.  40 capsule  0  . etonogestrel (IMPLANON) 68 MG IMPL implant Inject 1 each into the skin once.      . promethazine (PHENERGAN) 25 MG tablet Take 25 mg by mouth every 6 (six) hours as needed for nausea.        Review of Systems  Constitutional: Negative for fever, chills and malaise/fatigue.  Gastrointestinal: Negative for nausea, vomiting, abdominal pain, diarrhea and constipation.  Genitourinary: Negative for dysuria.  Musculoskeletal:       Bruising over right upper arm Implanon site  Neurological: Negative for headaches.   Physical Exam   Blood pressure 100/75, pulse 90, temperature 98.5 F (36.9 C), temperature source Oral, resp. rate 16, height 5\' 1"  (1.549 m), weight 82.736 kg (182 lb 6.4 oz), last menstrual period 06/02/2013, SpO2 100.00%.  Physical Exam  Constitutional: She is oriented to person, place, and time. She appears well-developed and well-nourished.  HENT:  Head: Normocephalic.  Cardiovascular: Normal rate.   Respiratory: Effort normal.  GI: Soft.  Musculoskeletal: Normal range of motion.  Small 1x1cm  area of ecchymosis on lower end of Implanon. No erethema or swelling.   Neurological: She is alert and oriented to person, place, and time.  Skin: Skin is warm and dry.  Psychiatric: She has a normal mood and affect.    MAU Course  Procedures Results for orders placed during the hospital encounter of 06/13/13 (from the past 72 hour(s))  URINALYSIS, ROUTINE W REFLEX MICROSCOPIC     Status: Abnormal   Collection Time    06/13/13  6:25 PM      Result Value Range   Color, Urine YELLOW  YELLOW   APPearance CLEAR  CLEAR   Specific Gravity, Urine >1.030 (*) 1.005 - 1.030   pH 6.0  5.0 - 8.0   Glucose, UA NEGATIVE  NEGATIVE mg/dL   Hgb urine dipstick NEGATIVE  NEGATIVE   Bilirubin Urine NEGATIVE  NEGATIVE   Ketones, ur NEGATIVE  NEGATIVE mg/dL   Protein, ur NEGATIVE  NEGATIVE mg/dL   Urobilinogen, UA 0.2  0.0 - 1.0 mg/dL   Nitrite  NEGATIVE  NEGATIVE   Leukocytes, UA NEGATIVE  NEGATIVE   Comment: MICROSCOPIC NOT DONE ON URINES WITH NEGATIVE PROTEIN, BLOOD, LEUKOCYTES, NITRITE, OR GLUCOSE <1000 mg/dL.  POCT PREGNANCY, URINE     Status: None   Collection Time    06/13/13  7:05 PM      Result Value Range   Preg Test, Ur NEGATIVE  NEGATIVE   Comment:            THE SENSITIVITY OF THIS     METHODOLOGY IS >24 mIU/mL     Assessment and Plan  A:  Contusion over Implanon site      Not pregnant  P:  Discussed Implanon and removal.  Discussed that the capsule is inert and will not hurt anything while it is there. Removal will help with tenderness.       Advised to try [facility] for removal. Cannot afford our offices or clinic costs      Needs new form of contraception, may use HD contraception clinic when she decides what method to use      Heat and tylenol for ecchymosis   [facility] 06/13/2013, 7:34 PM

## 2013-06-16 NOTE — MAU Provider Note (Signed)
Attestation of Attending Supervision of Advanced Practitioner (CNM/NP): Evaluation and management procedures were performed by the Advanced Practitioner under my supervision and collaboration.  I have reviewed the Advanced Practitioner's note and chart, and I agree with the management and plan.  HARRAWAY-Creary, Curt Oatis 4:32 PM     

## 2013-07-20 ENCOUNTER — Emergency Department (HOSPITAL_COMMUNITY)
Admission: EM | Admit: 2013-07-20 | Discharge: 2013-07-20 | Disposition: A | Discharge status: Home / Self Care | Payer: Medicaid Other | Attending: Emergency Medicine | Admitting: Emergency Medicine

## 2013-07-20 ENCOUNTER — Encounter (HOSPITAL_COMMUNITY): Payer: Self-pay | Admitting: Emergency Medicine

## 2013-07-20 DIAGNOSIS — Z87442 Personal history of urinary calculi: Secondary | ICD-10-CM | POA: Insufficient documentation

## 2013-07-20 DIAGNOSIS — R5381 Other malaise: Secondary | ICD-10-CM | POA: Insufficient documentation

## 2013-07-20 DIAGNOSIS — R110 Nausea: Secondary | ICD-10-CM | POA: Insufficient documentation

## 2013-07-20 DIAGNOSIS — Z3202 Encounter for pregnancy test, result negative: Secondary | ICD-10-CM | POA: Insufficient documentation

## 2013-07-20 DIAGNOSIS — N390 Urinary tract infection, site not specified: Secondary | ICD-10-CM

## 2013-07-20 DIAGNOSIS — K5900 Constipation, unspecified: Secondary | ICD-10-CM | POA: Insufficient documentation

## 2013-07-20 DIAGNOSIS — R5383 Other fatigue: Secondary | ICD-10-CM

## 2013-07-20 LAB — BASIC METABOLIC PANEL
BUN: 10 mg/dL (ref 6–23)
CHLORIDE: 104 meq/L (ref 96–112)
CO2: 22 mEq/L (ref 19–32)
Calcium: 9.1 mg/dL (ref 8.4–10.5)
Creatinine, Ser: 0.53 mg/dL (ref 0.50–1.10)
GFR calc Af Amer: >90 mL/min (ref >90)
Glucose, Bld: 107 mg/dL — ABNORMAL HIGH (ref 70–99)
POTASSIUM: 3.9 meq/L (ref 3.7–5.3)
Sodium: 138 mEq/L (ref 137–147)

## 2013-07-20 LAB — URINALYSIS, ROUTINE W REFLEX MICROSCOPIC
Bilirubin Urine: NEGATIVE
Glucose, UA: NEGATIVE mg/dL
Hgb urine dipstick: NEGATIVE
Ketones, ur: NEGATIVE mg/dL
NITRITE: POSITIVE — AB
PH: 7 (ref 5.0–8.0)
Protein, ur: NEGATIVE mg/dL
Specific Gravity, Urine: 1.021 (ref 1.005–1.030)
Urobilinogen, UA: 1 mg/dL (ref 0.0–1.0)

## 2013-07-20 LAB — URINE MICROSCOPIC-ADD ON

## 2013-07-20 LAB — CBC WITH DIFFERENTIAL/PLATELET
Basophils Absolute: 0 10*3/uL (ref 0.0–0.1)
Basophils Relative: 0 % (ref 0–1)
Eosinophils Absolute: 0.1 10*3/uL (ref 0.0–0.7)
Eosinophils Relative: 1 % (ref 0–5)
HCT: 34.7 % — ABNORMAL LOW (ref 36.0–46.0)
HEMOGLOBIN: 11.3 g/dL — AB (ref 12.0–15.0)
Lymphocytes Relative: 16 % (ref 12–46)
Lymphs Abs: 1.4 10*3/uL (ref 0.7–4.0)
MCH: 27.9 pg (ref 26.0–34.0)
MCHC: 32.6 g/dL (ref 30.0–36.0)
MCV: 85.7 fL (ref 78.0–100.0)
MONOS PCT: 6 % (ref 3–12)
Monocytes Absolute: 0.5 10*3/uL (ref 0.1–1.0)
NEUTROS ABS: 6.9 10*3/uL (ref 1.7–7.7)
Neutrophils Relative %: 77 % (ref 43–77)
Platelets: 264 10*3/uL (ref 150–400)
RBC: 4.05 MIL/uL (ref 3.87–5.11)
RDW: 15.1 % (ref 11.5–15.5)
WBC: 8.9 10*3/uL (ref 4.0–10.5)

## 2013-07-20 LAB — PREGNANCY, URINE: PREG TEST UR: NEGATIVE

## 2013-07-20 MED ORDER — POLYETHYLENE GLYCOL 3350 17 G PO PACK: 17.0000 g | PACK | Freq: Every day | ORAL | Status: DC

## 2013-07-20 MED ORDER — NITROFURANTOIN MONOHYD MACRO 100 MG PO CAPS: 100.0000 mg | ORAL_CAPSULE | Freq: Two times a day (BID) | ORAL | Status: DC

## 2013-07-20 NOTE — ED Notes (Signed)
Pt from home c/o constant hunger, being tired and sore breasts. Pt denies N/V/D. Pt last BM was yesterday, but pt reports "small, I feel constipated." Pt is A&O and in NAD

## 2013-07-20 NOTE — Discharge Instructions (Signed)
Constipation, Adult Constipation is when a person has fewer than 3 bowel movements a week; has difficulty having a bowel movement; or has stools that are dry, hard, or larger than normal. As people grow older, constipation is more common. If you try to fix constipation with medicines that make you have a bowel movement (laxatives), the problem may get worse. Long-term laxative use may cause the muscles of the colon to become weak. A low-fiber diet, not taking in enough fluids, and taking certain medicines may make constipation worse. CAUSES   Certain medicines, such as antidepressants, pain medicine, iron supplements, antacids, and water pills.   Certain diseases, such as diabetes, irritable bowel syndrome (IBS), thyroid disease, or depression.   Not drinking enough water.   Not eating enough fiber-rich foods.   Stress or travel.  Lack of physical activity or exercise.  Not going to the restroom when there is the urge to have a bowel movement.  Ignoring the urge to have a bowel movement.  Using laxatives too much. SYMPTOMS   Having fewer than 3 bowel movements a week.   Straining to have a bowel movement.   Having hard, dry, or larger than normal stools.   Feeling full or bloated.   Pain in the lower abdomen.  Not feeling relief after having a bowel movement. DIAGNOSIS  Your caregiver will take a medical history and perform a physical exam. Further testing may be done for severe constipation. Some tests may include:   A barium enema X-ray to examine your rectum, colon, and sometimes, your small intestine.  A sigmoidoscopy to examine your lower colon.  A colonoscopy to examine your entire colon. TREATMENT  Treatment will depend on the severity of your constipation and what is causing it. Some dietary treatments include drinking more fluids and eating more fiber-rich foods. Lifestyle treatments may include regular exercise. If these diet and lifestyle recommendations  do not help, your caregiver may recommend taking over-the-counter laxative medicines to help you have bowel movements. Prescription medicines may be prescribed if over-the-counter medicines do not work.  HOME CARE INSTRUCTIONS   Increase dietary fiber in your diet, such as fruits, vegetables, whole grains, and beans. Limit high-fat and processed sugars in your diet, such as [place] fries, hamburgers, cookies, candies, and soda.   A fiber supplement may be added to your diet if you cannot get enough fiber from foods.   Drink enough fluids to keep your urine clear or pale yellow.   Exercise regularly or as directed by your caregiver.   Go to the restroom when you have the urge to go. Do not hold it.  Only take medicines as directed by your caregiver. Do not take other medicines for constipation without talking to your caregiver first. [place] IF:   You have bright red blood in your stool.   Your constipation lasts for more than 4 days or gets worse.   You have abdominal or rectal pain.   You have thin, pencil-like stools.  You have unexplained weight loss. MAKE SURE YOU:   Understand these instructions.  Will watch your condition.  Will get help right away if you are not doing well or get worse. Document Released: 03/03/2004 Document Revised: 08/28/2011 Document Reviewed: 03/17/2013 [facility] Patient Information 2014 [place], [place]. Urinary Tract Infection Urinary tract infections (UTIs) can develop anywhere along your urinary tract. Your urinary tract is your body's drainage system for removing wastes and extra water. Your urinary tract includes two kidneys, two ureters,  a bladder, and a urethra. Your kidneys are a pair of bean-shaped organs. Each kidney is about the size of your fist. They are located below your ribs, one on each side of your spine. CAUSES Infections are caused by microbes, which are microscopic organisms, including fungi, viruses, and  bacteria. These organisms are so small that they can only be seen through a microscope. Bacteria are the microbes that most commonly cause UTIs. SYMPTOMS  Symptoms of UTIs may vary by age and gender of the patient and by the location of the infection. Symptoms in young women typically include a frequent and intense urge to urinate and a painful, burning feeling in the bladder or urethra during urination. Older women and men are more likely to be tired, shaky, and weak and have muscle aches and abdominal pain. A fever may mean the infection is in your kidneys. Other symptoms of a kidney infection include pain in your back or sides below the ribs, nausea, and vomiting. DIAGNOSIS To diagnose a UTI, your caregiver will ask you about your symptoms. Your caregiver also will ask to provide a urine sample. The urine sample will be tested for bacteria and white blood cells. White blood cells are made by your body to help fight infection. TREATMENT  Typically, UTIs can be treated with medication. Because most UTIs are caused by a bacterial infection, they usually can be treated with the use of antibiotics. The choice of antibiotic and length of treatment depend on your symptoms and the type of bacteria causing your infection. HOME CARE INSTRUCTIONS  If you were prescribed antibiotics, take them exactly as your caregiver instructs you. Finish the medication even if you feel better after you have only taken some of the medication.  Drink enough water and fluids to keep your urine clear or pale yellow.  Avoid caffeine, tea, and carbonated beverages. They tend to irritate your bladder.  Empty your bladder often. Avoid holding urine for long periods of time.  Empty your bladder before and after sexual intercourse.  After a bowel movement, women should cleanse from front to back. Use each tissue only once. SEEK MEDICAL CARE IF:   You have back pain.  You develop a fever.  Your symptoms do not begin to  resolve within 3 days. SEEK IMMEDIATE MEDICAL CARE IF:   You have severe back pain or lower abdominal pain.  You develop chills.  You have nausea or vomiting.  You have continued burning or discomfort with urination. MAKE SURE YOU:   Understand these instructions.  Will watch your condition.  Will get help right away if you are not doing well or get worse. Document Released: 03/15/2005 Document Revised: 12/05/2011 Document Reviewed: 07/14/2011 [facility] Patient Information 2014 [place].

## 2013-07-20 NOTE — ED Provider Notes (Signed)
CSN: [id number]     Arrival date & time 07/20/13  1731 History   First MD Initiated Contact with Patient 07/20/13 1743     Chief Complaint  Patient presents with  . Multiple Complaints    (Consider location/radiation/quality/duration/timing/severity/associated sxs/prior Treatment) Patient is a 28 y.o. female presenting with abdominal pain. The history is provided by the patient. No language interpreter was used.  Abdominal Pain Pain location:  Generalized Pain quality: aching   Pain radiates to:  Does not radiate Pain severity:  No pain Timing:  Constant Progression:  Worsening Chronicity:  New Relieved by:  Nothing Worsened by:  Nothing tried Ineffective treatments:  None tried Associated symptoms: constipation, fatigue and nausea   Associated symptoms: no anorexia   Risk factors: no alcohol abuse, has not had multiple surgeries and not pregnant     Past Medical History  Diagnosis Date  . Kidney stones   . Kidney stones    Past Surgical History  Procedure Laterality Date  . Renal artery stent      placed and removed in 2005  . Cesarean section      Nov 2011   Family History  Problem Relation Age of Onset  . Hypertension Mother   . Depression Mother   . Diabetes Father   . Hypertension Father   . Heart disease Father   . Cancer Father    History  Substance Use Topics  . Smoking status: Never Smoker   . Smokeless tobacco: Not on file  . Alcohol Use: No   OB History   Grav Para Term Preterm Abortions TAB SAB Ect Mult Living   2 1 1  1  1   1      Review of Systems  Constitutional: Positive for fatigue.  Gastrointestinal: Positive for nausea, abdominal pain and constipation. Negative for anorexia.  All other systems reviewed and are negative.    Allergies  Ciprofloxacin and Erythromycin base  Home Medications   Current Outpatient Rx  Name  Route  Sig  Dispense  Refill  . aspirin 325 MG tablet   Oral   Take 650 mg by mouth once.         .  cephALEXin (KEFLEX) 500 MG capsule   Oral   Take 1 capsule (500 mg total) by mouth 4 (four) times daily.   40 capsule   0   . etonogestrel (IMPLANON) 68 MG IMPL implant   Subcutaneous   Inject 1 each into the skin once.         . promethazine (PHENERGAN) 25 MG tablet   Oral   Take 25 mg by mouth every 6 (six) hours as needed for nausea.          BP 133/82  Pulse 69  Temp(Src) 98.3 F (36.8 C) (Oral)  Resp 17  SpO2 98%  LMP 06/28/2013 Physical Exam  Nursing note and vitals reviewed. Constitutional: She is oriented to person, place, and time. She appears well-developed and well-nourished.  HENT:  Head: Normocephalic.  Right Ear: External ear normal.  Left Ear: External ear normal.  Nose: Nose normal.  Mouth/Throat: Oropharynx is clear and moist.  Eyes: Pupils are equal, round, and reactive to light.  Neck: Normal range of motion.  Cardiovascular: Normal rate and regular rhythm.   Pulmonary/Chest: Effort normal.  Abdominal: Soft. Bowel sounds are normal.  Musculoskeletal: Normal range of motion.  Neurological: She is alert and oriented to person, place, and time.  Skin: Skin is warm.  Psychiatric: She  has a normal mood and affect.    ED Course  Procedures (including critical care time) Labs Review Labs Reviewed - No data to display Imaging Review No results found.  EKG Interpretation   None       MDM urine shows 11-20 wbc's  Many bacteria   1. UTI (lower urinary tract infection)   2. Constipation       Fransico Meadow, PA-C 07/20/13 2014

## 2013-07-20 NOTE — ED Provider Notes (Signed)
Medical screening examination/treatment/procedure(s) were performed by non-physician practitioner and as supervising physician I was immediately available for consultation/collaboration.  EKG Interpretation   None         [place], DO 07/20/13 2341

## 2013-07-22 LAB — URINE CULTURE: Colony Count: >=100000

## 2014-02-13 ENCOUNTER — Encounter (HOSPITAL_COMMUNITY): Payer: Self-pay | Admitting: Emergency Medicine

## 2014-02-13 ENCOUNTER — Emergency Department (HOSPITAL_COMMUNITY)
Admission: EM | Admit: 2014-02-13 | Discharge: 2014-02-14 | Disposition: A | Discharge status: Home / Self Care | Payer: Medicaid Other | Attending: Dermatology | Admitting: Dermatology

## 2014-02-13 DIAGNOSIS — Z87442 Personal history of urinary calculi: Secondary | ICD-10-CM | POA: Diagnosis not present

## 2014-02-13 DIAGNOSIS — G44209 Tension-type headache, unspecified, not intractable: Secondary | ICD-10-CM | POA: Diagnosis not present

## 2014-02-13 DIAGNOSIS — R51 Headache: Secondary | ICD-10-CM | POA: Insufficient documentation

## 2014-02-13 MED ORDER — KETOROLAC TROMETHAMINE 60 MG/2ML IM SOLN
60.0000 mg | Freq: Once | INTRAMUSCULAR | Status: AC
  Administered 2014-02-13: 60 mg via INTRAMUSCULAR
  Filled 2014-02-13: qty 2

## 2014-02-13 MED ORDER — METOCLOPRAMIDE HCL 5 MG/ML IJ SOLN
10.0000 mg | Freq: Once | INTRAMUSCULAR | Status: AC
  Administered 2014-02-13: 10 mg via INTRAMUSCULAR
  Filled 2014-02-13: qty 2

## 2014-02-13 MED ORDER — DIPHENHYDRAMINE HCL 50 MG/ML IJ SOLN
25.0000 mg | Freq: Once | INTRAMUSCULAR | Status: AC
  Administered 2014-02-13: 25 mg via INTRAMUSCULAR
  Filled 2014-02-13: qty 1

## 2014-02-13 NOTE — ED Notes (Signed)
Pt c/o HA onset noon today, frontal radiating to back of head. +nausea with dry heaves. +light and sound sensitivity. Pt reports blurred vision. A & O. Pt states she has not follow up for HA in the past.

## 2014-02-13 NOTE — ED Provider Notes (Signed)
CSN: [id number]     Arrival date & time 02/13/14  2043 History   First MD Initiated Contact with Patient 02/13/14 2302     Chief Complaint  Patient presents with  . Headache     (Consider location/radiation/quality/duration/timing/severity/associated sxs/prior Treatment) HPI [name] is a 28 y.o. female with a history of intermittent migraine who comes in for evaluation of headache. She says that around 12:00 this afternoon her head just started hurting and felt like one of her migraines she's had in the past. She describes the pain as a gradual onset, tension type throbbing sensation. She localizes the pain to her forehead and travels to the back of her head. She tried two extra strength Tylenol today with no relief. She says that light and sounds make it worse. She denies any fevers, vision changes, abdominal pain. She reports mild nausea with no vomiting. She has not sought f/u care for HA in the past due to lack of insurance.  Past Medical History  Diagnosis Date  . Kidney stones   . Kidney stones    Past Surgical History  Procedure Laterality Date  . Renal artery stent      placed and removed in 2005  . Cesarean section      Nov 2011   Family History  Problem Relation Age of Onset  . Hypertension Mother   . Depression Mother   . Diabetes Father   . Hypertension Father   . Heart disease Father   . Cancer Father    History  Substance Use Topics  . Smoking status: Never Smoker   . Smokeless tobacco: Not on file  . Alcohol Use: No   OB History   Grav Para Term Preterm Abortions TAB SAB Ect Mult Living   2 1 1  1  1   1      Review of Systems  Constitutional: Negative for fever.  Respiratory: Negative for shortness of breath.   Cardiovascular: Negative for chest pain.  Skin: Negative for rash.  Neurological: Positive for headaches. Negative for weakness.  All other systems reviewed and are negative.     Allergies  Erythromycin base; Ciprofloxacin; and  Watermelon flavor  Home Medications   Prior to Admission medications   Not on File   BP 116/73  Pulse 99  Temp(Src) 97.9 F (36.6 C) (Oral)  Resp 16  Ht 5' (1.524 m)  Wt 186 lb (84.369 kg)  BMI 36.33 kg/m2  SpO2 98%  LMP 01/28/2014 Physical Exam  Nursing note and vitals reviewed. Constitutional: She appears well-developed and well-nourished. No distress.  Awake, alert, nontoxic appearance.  HENT:  Head: Normocephalic and atraumatic.  Mouth/Throat: Oropharynx is clear and moist. No oropharyngeal exudate.  No meningeal signs  Eyes: Conjunctivae and EOM are normal. Pupils are equal, round, and reactive to light. Right eye exhibits no discharge. Left eye exhibits no discharge. No scleral icterus.  Neck: Neck supple.  Pulmonary/Chest: Effort normal. She exhibits no tenderness.  Abdominal: Soft. There is no tenderness. There is no rebound.  Musculoskeletal: She exhibits no tenderness.  Baseline ROM, no obvious new focal weakness.  Neurological:  Mental status and motor strength appears baseline for patient and situation.  Skin: Skin is warm and dry. No rash noted. She is not diaphoretic.  Psychiatric: She has a normal mood and affect.    ED Course  Procedures (including critical care time) Labs Review Labs Reviewed - No data to display  Imaging Review No results found.   EKG  Interpretation None     Meds given in ED:  Medications  metoCLOPramide (REGLAN) injection 10 mg (not administered)  diphenhydrAMINE (BENADRYL) injection 25 mg (not administered)  ketorolac (TORADOL) injection 60 mg (not administered)    New Prescriptions   No medications on file   Filed Vitals:   02/13/14 2103  BP: 116/73  Pulse: 99  Temp: 97.9 F (36.6 C)  TempSrc: Oral  Resp: 16  Height: 5' (1.524 m)  Weight: 186 lb (84.369 kg)  SpO2: 98%     MDM  Vitals stable - WNL -afebrile Pt resting comfortably in ED. Migraine cocktail worked well, HA resolved. PE not concerning for  CVA, SAH or meningitis. Consistent with Primary- tension type HA. Discussed f/u with PCP and return precautions, pt very amenable to plan.   Final diagnoses:  None  Prior to patient discharge, I discussed and reviewed this case with Dr.Steinl         Verl Dicker, PA-C 02/14/14 [phone]

## 2014-02-14 MED ORDER — NAPROXEN 500 MG PO TABS: 500.0000 mg | ORAL_TABLET | Freq: Two times a day (BID) | ORAL | Status: DC

## 2014-02-14 NOTE — ED Provider Notes (Signed)
Medical screening examination/treatment/procedure(s) were conducted as a shared visit with non-physician practitioner(s) and myself.  I personally evaluated the patient during the encounter.  Pt with hx migraines, c/o frontal, throbbing headache c/w prior migraines. Gradual onset. Slowly worse. No acute or abrupt change. No neck pain or stiffness. No eye pain or change in vision. No numbness/weakness or loss of normal functional ability.  No temporal tenderness, sinus pressure, no neck stiffness/ rigidity on exam.      Mirna Mires, MD 02/14/14 [phone]

## 2014-02-14 NOTE — Discharge Instructions (Signed)
Take Aleve as needed for tension Headache. Follow up with Primary Care for further evaluation of your headache.   Emergency Department Resource Guide 1) Find a Doctor and Pay Out of Pocket Although you won't have to find out who is covered by your insurance plan, it is a good idea to ask around and get recommendations. You will then need to call the office and see if the doctor you have chosen will accept you as a new patient and what types of options they offer for patients who are self-pay. Some doctors offer discounts or will set up payment plans for their patients who do not have insurance, but you will need to ask so you aren't surprised when you get to your appointment.  2) Contact Your Local Health Department Not all health departments have doctors that can see patients for sick visits, but many do, so it is worth a call to see if yours does. If you don't know where your local health department is, you can check in your phone book. The CDC also has a tool to help you locate your state's health department, and many state websites also have listings of all of their local health departments.  3) Find a [facility] If your illness is not likely to be very severe or complicated, you may want to try a walk in clinic. These are popping up all over the country in pharmacies, drugstores, and shopping centers. They're usually staffed by nurse practitioners or physician assistants that have been trained to treat common illnesses and complaints. They're usually fairly quick and inexpensive. However, if you have serious medical issues or chronic medical problems, these are probably not your best option.  No Primary Care Doctor: - Call Health Connect at  [phone] - they can help you locate a primary care doctor that  accepts your insurance, provides certain services, etc. - Physician Referral Service- [phone]  Chronic Pain Problems: Organization         Address  Phone   Notes  [facility]  [phone] Patients need to be referred by their primary care doctor.   Medication Assistance: Organization         Address  Phone   Notes  [facility] Medication [facility] [address] [place], [place] [address] [phone] --Must be a resident of [facility] -- Must have NO insurance coverage whatsoever (no Medicaid/ Medicare, etc.) -- The pt. MUST have a primary care doctor that directs their care regularly and follows them in the community   Med[facility]ist  [phone]   [employer]  [phone]    Agencies that provide inexpensive medical care: Organization         Address  Phone   Notes  [place]  [phone]   [facility]    [phone]   [facility] [place], [place] [address] [phone]   [place] [address], [place] [phone]   Planned Parenthood    [phone]   [facility]    [phone]   [place] and [place] [address], [place] Phone:  [phone], Fax:  [phone] Hours of Operation:  9 am - 6 pm, M-F.  Also accepts Medicaid/Medicare and self-pay.  [facility] for [place] [address], [place] Phone: [phone], Fax: [phone]. Hours  of Operation:  8:30 am - 5:30 pm, M-F.  Also accepts Medicaid and self-pay.  [facility] [place] [address], [place] Phone: [phone]   [place], [place], [place] [phone], Ext. [phone] Mondays & Thursdays: 7-9 AM.  First 15 patients are seen on a first come, first serve basis.    Ewa Gentry Providers:  Organization         Address  Phone   Notes  [facility] [address], [place] [phone] Also accepts self-pay patients.  [facility] [address], [place]  [phone]   [place], [address], [place] [phone]   [facility] [address], [place] [phone]   Lucianne Lei [address], [place]   [phone] Only accepts [place] Access [place] patients after they have their name applied to their card.   Self-Pay (no insurance) in [facility]:  Organization         Address  Phone   Notes  Sickle Cell Patients, [facility] Internal [facility] [place] [phone]   [facility]gent [facility] [place] [phone]   [facility] [place]  [place], [place], [place] [phone]   [facility]/Dr. Osei-Bonsu  [address], [place] or [address], [place] [phone] Phone number for both [place] and [place] locations is the same.  Urgent Medical and [facility] [address] [place] [phone]   [facility] [address] [place] or [address] [phone]   [facility] [address], [place] [phone], phone; [phone], fax Sees patients 1st and 3rd Saturday of every month.  Must not qualify for public or private insurance (i.e. Medicaid, Medicare, [place] Health Choice, Veterans' Benefits)  Household income should be no more than 200% of the poverty level The clinic cannot treat you if you are pregnant or think you are pregnant  Sexually transmitted diseases are not treated at the clinic.    Dental Care: Organization         Address  Phone  Notes  [facility] Department of [facility] [phone] Accepts children up to age 25 who are enrolled in [place] or [place]; pregnant women with a Medicaid card; and children who have applied for Medicaid  or [place] Health Choice, but were declined, whose parents can pay a reduced fee at time of service.  [facility] Department of [facility]  [address], [place] [phone] Accepts children up to age 71 who are enrolled in [place] or [place]; pregnant women with a Medicaid card; and children who have applied for Medicaid or [place] Health Choice, but were declined, whose parents can pay a reduced fee at time of service.  [place] Adult Dental Access PROGRAM  [place] [phone] Patients are seen by appointment only. Walk-ins are not accepted. [place] will see patients 52 years of age and older. Monday - Tuesday (8am-5pm) Most Wednesdays (8:30-5pm) $30 per visit, cash only  [facility] Adult Dental Access PROGRAM  [address], [place] [phone] Patients are  seen by appointment only. Walk-ins are not accepted. [place] will see patients 30 years of age and older. One Wednesday Evening (Monthly: Volunteer Based).  $30 per visit, cash only  [place]  [phone] for adults; Children under age 61, call Graduate Pediatric Dentistry at [phone]. Children aged 81-14, please call [phone] to request a pediatric application.  Dental services are provided in all areas of dental care including fillings, crowns and bridges, complete and partial dentures, implants, gum treatment, root canals, and extractions. Preventive care is also provided. Treatment is provided to both adults and children. Patients are selected via a lottery and there is often a waiting list.   [facility] [address] [place]  [phone] [email or web address]   [facility] [address] [place], [place] [phone], Ext. [phone] Second and Fourth Thursday of each month, opens at 6:30 AM; Clinic ends at 9 AM.  Patients are seen on a first-come first-served basis, and a limited number are seen  during each clinic.   [facility]  [address] Hillard Danker [place], [place] [phone]   Eligibility Requirements You must have lived in [place], [place], or [place] counties for at least the last three months.   You cannot be eligible for state or federal sponsored [employer], including [employer], [place], or [employer].   You generally cannot be eligible for healthcare insurance through your employer.    How to apply: Eligibility screenings are held every Tuesday and Wednesday afternoon from 1:00 pm until 4:00 pm. You do not need an appointment for the interview!  [facility] [address] [place], [place]   [place]  [place] Department  [place]  [phone]    Behavioral Health Resources in the Community: Intensive Outpatient Programs Organization         Address  Phone  Notes  [place]. [address], [place], [place] [phone]   [facility] [address], [place], [place]   ADS: Alcohol & Drug Svcs [address] [place], [place]   [place] [address]  [place], [place] or [phone]   Substance Abuse Resources Organization         Address  Phone  Notes  Alcohol and Drug Services  [phone]   [place]  [phone]   [facility] [place]   Chinita Pester  [phone]   Residential & Outpatient Substance Abuse Program  [phone]   Psychological Services Organization         Address  Phone  Notes  [facility]  [place]  [phone]   [place] [address] [place] or [phone]    Mobile Crisis Teams Organization         Address  Phone  Notes  [facility],  Mobile Crisis Care Unit  [phone]   [facility]  [address] [place], [place]   [place] [address], [place] [phone]    Self-Help/Support Groups Organization         Address  Phone             Notes  [place]. of  - variety of support groups  [place] Call for  more information  Narcotics Anonymous (NA), [facility] [address], [employer] [place]  2 meetings at this location   Residential Facilities manager         Address  Phone  Notes  [facility] Residential Treatment [place],    [place]  [phone]   [facility]  [address] [place] [id number], [place], [place]   [place], [place] [phone] Admissions: 8am-3pm M-F  Incentives Substance [place] [address]    [place], [place] [phone]   [facility] [address] [place], [place], [place]   [facility] [address],  [place], [place]   [facility] Programs - Intensive Outpatient [address] Kristeen [address], [place], [place]   [facility].,  [place], [place] [phone] or [phone]   [facility] Treatment Services (RTS) [address], [place], [place] Accepts Medicaid  [facility] [address],  [place] [phone] Substance Abuse/Addiction Treatment   [facility] Organization         Address  Phone  Notes  [facility]  [phone]   Domenic Schwab, PhD [address] Arlis Porta [place], [place]   [phone] or [phone]   Tennille Peletier [place], [place] [phone]   [facility] [address], [place], [place] [phone] Insurance/Medicaid/sponsorship through [facility] [address]  [address] [place]                                    [place], [place] [phone] [place] [address], [place] [phone]    Dr. Adele Schilder  [phone]   [facility] Dept. 1) [address], [place] 2) [place] 3)  [address], [place] [phone]  [phone]   [place] [phone] or [phone] (After Hours)       Tension Headache A tension headache is a feeling of pain, pressure, or aching often felt over the front and sides of the head. The pain can be dull or can feel tight (constricting). It is the most common type of headache. Tension headaches are not normally associated with nausea or vomiting and do not get worse with physical activity. Tension headaches can last 30 minutes to several days.  CAUSES  The exact cause is not known, but it may be caused by chemicals and hormones in the brain that lead to pain. Tension headaches often begin after stress, anxiety, or depression. Other triggers may include:  Alcohol.  Caffeine (too much or withdrawal).  Respiratory infections (colds, flu, sinus infections).  Dental problems or teeth clenching.  Fatigue.  Holding your head and neck in one position too long while using a computer. SYMPTOMS   Pressure around the head.   Dull, aching head pain.   Pain felt over the front and sides of the head.   Tenderness in the muscles of the head, neck, and shoulders. DIAGNOSIS  A tension headache is often diagnosed based on:   Symptoms.   Physical examination.   A CT scan or MRI of your head. These tests  may be ordered if symptoms are severe or unusual. TREATMENT  Medicines may be given to help relieve symptoms.  HOME CARE INSTRUCTIONS   Only take over-the-counter or prescription medicines for pain or discomfort as directed by your caregiver.   Lie  down in a dark, quiet room when you have a headache.   Keep a journal to find out what may be triggering your headaches. For example, write down:  What you eat and drink.  How much sleep you get.  Any change to your diet or medicines.  Try massage or other relaxation techniques.   Ice packs or heat applied to the head and neck can be used. Use these 3 to 4 times per day for 15 to 20 minutes each time, or as needed.   Limit stress.   Sit up straight, and do not tense your muscles.   Quit smoking if you smoke.  Limit alcohol use.  Decrease the amount of caffeine you drink, or stop drinking caffeine.  Eat and exercise regularly.  Get 7 to 9 hours of sleep, or as recommended by your caregiver.  Avoid excessive use of pain medicine as recurrent headaches can occur.  SEEK MEDICAL CARE IF:   You have problems with the medicines you were prescribed.  Your medicines do not work.  You have a change from the usual headache.  You have nausea or vomiting. SEEK IMMEDIATE MEDICAL CARE IF:   Your headache becomes severe.  You have a fever.  You have a stiff neck.  You have loss of vision.  You have muscular weakness or loss of muscle control.  You lose your balance or have trouble walking.  You feel faint or pass out.  You have severe symptoms that are different from your first symptoms. MAKE SURE YOU:   Understand these instructions.  Will watch your condition.  Will get help right away if you are not doing well or get worse. Document Released: 06/05/2005 Document Revised: 08/28/2011 Document Reviewed: 05/26/2011 [facility] Patient Information 2015 [place], [place]. This information is not intended to replace advice given to you by your health care provider. Make sure you discuss any questions you have with your health care provider.

## 2014-04-14 ENCOUNTER — Ambulatory Visit: Payer: Medicaid Other | Admitting: Obstetrics & Gynecology

## 2014-04-20 ENCOUNTER — Encounter (HOSPITAL_COMMUNITY): Payer: Self-pay | Admitting: Emergency Medicine

## 2014-04-22 ENCOUNTER — Ambulatory Visit: Payer: Medicaid Other | Admitting: Obstetrics & Gynecology

## 2014-05-27 ENCOUNTER — Encounter: Payer: Self-pay | Admitting: Obstetrics & Gynecology

## 2014-05-27 ENCOUNTER — Ambulatory Visit (INDEPENDENT_AMBULATORY_CARE_PROVIDER_SITE_OTHER): Payer: BLUE CROSS/BLUE SHIELD | Admitting: Obstetrics & Gynecology

## 2014-05-27 VITALS — BP 109/73 | HR 78 | Ht 60.5 in | Wt 191.0 lb

## 2014-05-27 DIAGNOSIS — N920 Excessive and frequent menstruation with regular cycle: Secondary | ICD-10-CM | POA: Diagnosis not present

## 2014-05-27 NOTE — Progress Notes (Signed)
Patient is here to follow up on her fibroids.  She does have an implanon in place but it is no longer active as of one year this month.  Has not had it removed due to cost.  She just got [employer].  She had an ultrasound done this year with a study she was involved in, in September that said she had a 6 weeks size uterus due to a fibroid.  This was done with a woman's center on [address].  She can not remember the exact name of the place she was going as she only went to one appointment with them.  They did a full physical with pap smear as well during this visit.

## 2014-05-27 NOTE — Progress Notes (Signed)
Subjective:     Patient ID: [name], female   DOB: Sep 24, 1985, 28 y.o.   MRN: [id number]  HPI Pt reports heavy menstrual bleeding. Had Nexplanon which was removed 1 year prev.  She reports that she has not conceived since the Nexplanon was removed.  She is interested in getting pregnant and does not want any treatement that will interrupt pregnancy. She reports that she was in a 'fibroid' study but, stopped due to not making appts.  She denies ever having a pelvic sono and said that she was dx'd based on exam.  She reports passage of clots with menses.       Review of Systems     Objective:   Physical Exam BP 109/73 mmHg  Pulse 78  Ht 5' 0.5" (1.537 m)  Wt 191 lb (86.637 kg)  BMI 36.67 kg/m2  LMP 05/16/2014 (Exact Date) Pt in NAD Abd: obese GU: EGBUS: no lesions Vagina: no blood in vault Cervix: no CMT Uterus: small, mobile (difficult to fully assess due to body habitus)  Adnexa: no masses; sl tender          Assessment:     Menorrhagia with cycles- prev dx'd with fibroids no formal eval      Plan:     Pelvic sono to eval for bleeding F/u 3 months Pt to inquire of infertility benefits with ins co

## 2014-05-27 NOTE — Patient Instructions (Signed)

## 2014-06-05 ENCOUNTER — Ambulatory Visit (HOSPITAL_COMMUNITY)
Admission: RE | Admit: 2014-06-05 | Discharge: 2014-06-05 | Disposition: A | Discharge status: Home / Self Care | Payer: BC Managed Care – PPO | Source: Ambulatory Visit | Attending: Obstetrics & Gynecology | Admitting: Obstetrics & Gynecology

## 2014-06-05 DIAGNOSIS — N946 Dysmenorrhea, unspecified: Secondary | ICD-10-CM | POA: Insufficient documentation

## 2014-06-05 DIAGNOSIS — N920 Excessive and frequent menstruation with regular cycle: Secondary | ICD-10-CM | POA: Diagnosis present

## 2014-06-10 ENCOUNTER — Ambulatory Visit (INDEPENDENT_AMBULATORY_CARE_PROVIDER_SITE_OTHER): Payer: BLUE CROSS/BLUE SHIELD | Admitting: Obstetrics and Gynecology

## 2014-06-10 ENCOUNTER — Encounter: Payer: Self-pay | Admitting: Obstetrics and Gynecology

## 2014-06-10 VITALS — BP 109/75 | HR 86 | Wt 191.0 lb

## 2014-06-10 DIAGNOSIS — D250 Submucous leiomyoma of uterus: Secondary | ICD-10-CM | POA: Diagnosis not present

## 2014-06-10 NOTE — Progress Notes (Signed)
Patient ID: [name], female   DOB: [birth year], 28 y.o.   MRN: [id number] 28 yo [id number] here for follow up on menorrhagia and ultrasound results. Results of the ultrasound were reviewed and explained to the patient.  FINDINGS: Uterus  Measurements: 7.5 x 4.7 x 4.6 cm. No fibroids or other mass visualized.  Endometrium  Thickness: 1.1 cm. Inhomogeneous. Apparent anterior displacement of the fundal endometrial canal by hypoechoic filling defect could represent underlying submucosal fibroid 1.3 x 1.3 x 1.0 cm.  Right ovary  Measurements: 2.4 x 2.2 x 1.2 cm. Normal appearance/no adnexal mass.  Left ovary  Measurements: 3.7 x 4.1 x 0.4 cm. Normal appearance/no adnexal mass.  Other findings  Small amount free fluid  IMPRESSION: Possible posterior submucosal fibroid which could contribute to the history of menorrhagia but could be better evaluated at pelvic MRI with contrast for more definitive characterization  A/P 28 yo with fibroid uterus - Medical and surgical options were reviewed and explained to the patient. She strongly desires fertility and does not want any types of interventions at this time - Patient still has Implanon in right arm which needs to be removed. Will schedule appointment for removal - patient desires information for infertility specialist

## 2014-06-26 ENCOUNTER — Emergency Department (HOSPITAL_COMMUNITY)
Admission: EM | Admit: 2014-06-26 | Discharge: 2014-06-27 | Disposition: A | Discharge status: Home / Self Care | Payer: BLUE CROSS/BLUE SHIELD | Attending: Emergency Medicine | Admitting: Emergency Medicine

## 2014-06-26 ENCOUNTER — Encounter (HOSPITAL_COMMUNITY): Payer: Self-pay | Admitting: Emergency Medicine

## 2014-06-26 DIAGNOSIS — N3000 Acute cystitis without hematuria: Secondary | ICD-10-CM | POA: Diagnosis not present

## 2014-06-26 DIAGNOSIS — Z3202 Encounter for pregnancy test, result negative: Secondary | ICD-10-CM | POA: Insufficient documentation

## 2014-06-26 DIAGNOSIS — Z87442 Personal history of urinary calculi: Secondary | ICD-10-CM | POA: Insufficient documentation

## 2014-06-26 DIAGNOSIS — R1030 Lower abdominal pain, unspecified: Secondary | ICD-10-CM | POA: Diagnosis present

## 2014-06-26 LAB — CBC WITH DIFFERENTIAL/PLATELET
BASOS ABS: 0 10*3/uL (ref 0.0–0.1)
Basophils Relative: 0 % (ref 0–1)
Eosinophils Absolute: 0.1 10*3/uL (ref 0.0–0.7)
Eosinophils Relative: 1 % (ref 0–5)
HEMATOCRIT: 35 % — AB (ref 36.0–46.0)
Hemoglobin: 10.9 g/dL — ABNORMAL LOW (ref 12.0–15.0)
LYMPHS PCT: 26 % (ref 12–46)
Lymphs Abs: 1.9 10*3/uL (ref 0.7–4.0)
MCH: 25.8 pg — ABNORMAL LOW (ref 26.0–34.0)
MCHC: 31.1 g/dL (ref 30.0–36.0)
MCV: 82.7 fL (ref 78.0–100.0)
MONOS PCT: 8 % (ref 3–12)
Monocytes Absolute: 0.6 10*3/uL (ref 0.1–1.0)
Neutro Abs: 5 10*3/uL (ref 1.7–7.7)
Neutrophils Relative %: 65 % (ref 43–77)
PLATELETS: 283 10*3/uL (ref 150–400)
RBC: 4.23 MIL/uL (ref 3.87–5.11)
RDW: 15 % (ref 11.5–15.5)
WBC: 7.6 10*3/uL (ref 4.0–10.5)

## 2014-06-26 NOTE — ED Notes (Signed)
C/o bilateral lower groin pain (below C-section scar) and abdominal pain starting last night-c/o pain with any movement. Denies dysuria. Had Advil this morning with no alleviation of pain. Patient unsure if she could be pregnant. Last menstrual cycle was Christmas eve (says it was unusual -had "huge" blood clots and says period "would stop and start and I kept filling up diapers with blood."). Says she lost "all of her energy after that period." Denies N/V/D. Denies SOB, chest pain.

## 2014-06-27 DIAGNOSIS — N3000 Acute cystitis without hematuria: Secondary | ICD-10-CM | POA: Diagnosis not present

## 2014-06-27 LAB — COMPREHENSIVE METABOLIC PANEL
ALBUMIN: 3.9 g/dL (ref 3.5–5.2)
ALT: 18 U/L (ref 0–35)
ANION GAP: 5 (ref 5–15)
AST: 22 U/L (ref 0–37)
Alkaline Phosphatase: 78 U/L (ref 39–117)
BUN: 10 mg/dL (ref 6–23)
CHLORIDE: 108 meq/L (ref 96–112)
CO2: 22 mmol/L (ref 19–32)
Calcium: 8.7 mg/dL (ref 8.4–10.5)
Creatinine, Ser: 0.52 mg/dL (ref 0.50–1.10)
GFR calc Af Amer: >90 mL/min (ref >90)
GFR calc non Af Amer: >90 mL/min (ref >90)
Glucose, Bld: 116 mg/dL — ABNORMAL HIGH (ref 70–99)
POTASSIUM: 3.4 mmol/L — AB (ref 3.5–5.1)
Sodium: 135 mmol/L (ref 135–145)
Total Bilirubin: 0.6 mg/dL (ref 0.3–1.2)
Total Protein: 7.7 g/dL (ref 6.0–8.3)

## 2014-06-27 LAB — URINE MICROSCOPIC-ADD ON

## 2014-06-27 LAB — URINALYSIS, ROUTINE W REFLEX MICROSCOPIC
BILIRUBIN URINE: NEGATIVE
Glucose, UA: NEGATIVE mg/dL
Hgb urine dipstick: NEGATIVE
KETONES UR: NEGATIVE mg/dL
Nitrite: NEGATIVE
Protein, ur: NEGATIVE mg/dL
Specific Gravity, Urine: 1.015 (ref 1.005–1.030)
UROBILINOGEN UA: 1 mg/dL (ref 0.0–1.0)
pH: 6.5 (ref 5.0–8.0)

## 2014-06-27 LAB — LIPASE, BLOOD: LIPASE: 23 U/L (ref 11–59)

## 2014-06-27 LAB — POC URINE PREG, ED: Preg Test, Ur: NEGATIVE

## 2014-06-27 MED ORDER — CEPHALEXIN 500 MG PO CAPS
500.0000 mg | ORAL_CAPSULE | Freq: Once | ORAL | Status: AC
  Administered 2014-06-27: 500 mg via ORAL
  Filled 2014-06-27: qty 1

## 2014-06-27 MED ORDER — OXYCODONE-ACETAMINOPHEN 5-325 MG PO TABS
1.0000 | ORAL_TABLET | Freq: Once | ORAL | Status: AC
  Administered 2014-06-27: 1 via ORAL
  Filled 2014-06-27: qty 1

## 2014-06-27 MED ORDER — NAPROXEN 500 MG PO TABS: 500.0000 mg | ORAL_TABLET | Freq: Two times a day (BID) | ORAL | Status: DC

## 2014-06-27 MED ORDER — PHENAZOPYRIDINE HCL 200 MG PO TABS
200.0000 mg | ORAL_TABLET | Freq: Three times a day (TID) | ORAL | Status: DC
  Administered 2014-06-27: 200 mg via ORAL
  Filled 2014-06-27: qty 1

## 2014-06-27 MED ORDER — PHENAZOPYRIDINE HCL 200 MG PO TABS: 200.0000 mg | ORAL_TABLET | Freq: Three times a day (TID) | ORAL | Status: DC

## 2014-06-27 MED ORDER — CEPHALEXIN 500 MG PO CAPS: 500.0000 mg | ORAL_CAPSULE | Freq: Three times a day (TID) | ORAL | Status: DC

## 2014-06-27 NOTE — ED Provider Notes (Signed)
CSN: [id number]     Arrival date & time 06/26/14  2255 History   First MD Initiated Contact with Patient 06/27/14 [phone]     Chief Complaint  Patient presents with  . Abdominal Pain     HPI Patient presents to the emergency department 24 hours of dysuria and urinary frequency with associated suprapubic discomfort.  She denies fevers or chills.  Denies nausea or vomiting.  She states she had a heavy menstrual period approximately 2 and half weeks ago.  She's had no abnormal vaginal bleeding since then.  She has no significant history of urinary tract infections before in the past.  No flank pain.  No upper abdominal pain.  No fevers or chills.  Symptoms are mild to moderate in severity.  Past Medical History  Diagnosis Date  . Kidney stones   . Kidney stones    Past Surgical History  Procedure Laterality Date  . Renal artery stent      placed and removed in 2005  . Cesarean section      Nov 2011   Family History  Problem Relation Age of Onset  . Hypertension Mother   . Depression Mother   . Diabetes Father   . Hypertension Father   . Heart disease Father   . Cancer Father    History  Substance Use Topics  . Smoking status: Never Smoker   . Smokeless tobacco: Not on file  . Alcohol Use: No   OB History    Gravida Para Term Preterm AB TAB SAB Ectopic Multiple Living   2 1 1  1  1   1      Review of Systems  All other systems reviewed and are negative.     Allergies  Erythromycin base; Ciprofloxacin; and Watermelon flavor  Home Medications   Prior to Admission medications   Medication Sig Start Date End Date Taking? Authorizing Provider  cephALEXin (KEFLEX) 500 MG capsule Take 1 capsule (500 mg total) by mouth 3 (three) times daily. 06/27/14   Hoy Morn, MD  naproxen (NAPROSYN) 500 MG tablet Take 1 tablet (500 mg total) by mouth 2 (two) times daily. 06/27/14   Hoy Morn, MD  phenazopyridine (PYRIDIUM) 200 MG tablet Take 1 tablet (200 mg total) by mouth 3  (three) times daily. 06/27/14   Hoy Morn, MD   BP 129/77 mmHg  Pulse 79  Temp(Src) 97.7 F (36.5 C) (Oral)  Resp 18  Ht 5\' 1"  (1.549 m)  Wt 188 lb (85.276 kg)  BMI 35.54 kg/m2  SpO2 100%  LMP 06/11/2014 Physical Exam  Constitutional: She is oriented to person, place, and time. She appears well-developed and well-nourished. No distress.  HENT:  Head: Normocephalic and atraumatic.  Eyes: EOM are normal.  Neck: Normal range of motion.  Cardiovascular: Normal rate, regular rhythm and normal heart sounds.   Pulmonary/Chest: Effort normal and breath sounds normal.  Abdominal: Soft. She exhibits no distension.  Mild suprapubic tenderness without guarding or rebound.  Musculoskeletal: Normal range of motion.  Neurological: She is alert and oriented to person, place, and time.  Skin: Skin is warm and dry.  Psychiatric: She has a normal mood and affect. Judgment normal.  Nursing note and vitals reviewed.   ED Course  Procedures (including critical care time) Labs Review Labs Reviewed  CBC WITH DIFFERENTIAL - Abnormal; Notable for the following:    Hemoglobin 10.9 (*)    HCT 35.0 (*)    MCH 25.8 (*)  All other components within normal limits  COMPREHENSIVE METABOLIC PANEL - Abnormal; Notable for the following:    Potassium 3.4 (*)    Glucose, Bld 116 (*)    All other components within normal limits  URINALYSIS, ROUTINE W REFLEX MICROSCOPIC - Abnormal; Notable for the following:    APPearance CLOUDY (*)    Leukocytes, UA SMALL (*)    All other components within normal limits  URINE MICROSCOPIC-ADD ON - Abnormal; Notable for the following:    Bacteria, UA MANY (*)    All other components within normal limits  URINE CULTURE  LIPASE, BLOOD  POC URINE PREG, ED    Imaging Review No results found.   EKG Interpretation None      MDM   Final diagnoses:  Acute cystitis without hematuria    Patient be treated for urinary tract infection.  Home with a 5 day course  of antibiotics.  Urine culture sent.  Patient understands to return to the ER for new or worsening symptoms.    Hoy Morn, MD 06/27/14 [phone]

## 2014-06-27 NOTE — Discharge Instructions (Signed)

## 2014-06-30 ENCOUNTER — Emergency Department (HOSPITAL_COMMUNITY)
Admission: EM | Admit: 2014-06-30 | Discharge: 2014-07-01 | Disposition: A | Discharge status: Home / Self Care | Payer: BLUE CROSS/BLUE SHIELD | Attending: Emergency Medicine | Admitting: Emergency Medicine

## 2014-06-30 ENCOUNTER — Encounter (HOSPITAL_COMMUNITY): Payer: Self-pay | Admitting: Emergency Medicine

## 2014-06-30 ENCOUNTER — Telehealth (HOSPITAL_BASED_OUTPATIENT_CLINIC_OR_DEPARTMENT_OTHER): Payer: Self-pay | Admitting: Emergency Medicine

## 2014-06-30 DIAGNOSIS — Z7982 Long term (current) use of aspirin: Secondary | ICD-10-CM | POA: Insufficient documentation

## 2014-06-30 DIAGNOSIS — Z87442 Personal history of urinary calculi: Secondary | ICD-10-CM | POA: Insufficient documentation

## 2014-06-30 DIAGNOSIS — R110 Nausea: Secondary | ICD-10-CM | POA: Diagnosis not present

## 2014-06-30 DIAGNOSIS — R1031 Right lower quadrant pain: Secondary | ICD-10-CM | POA: Diagnosis not present

## 2014-06-30 DIAGNOSIS — Z3202 Encounter for pregnancy test, result negative: Secondary | ICD-10-CM | POA: Diagnosis not present

## 2014-06-30 DIAGNOSIS — Z79899 Other long term (current) drug therapy: Secondary | ICD-10-CM | POA: Insufficient documentation

## 2014-06-30 DIAGNOSIS — Z9889 Other specified postprocedural states: Secondary | ICD-10-CM | POA: Diagnosis not present

## 2014-06-30 DIAGNOSIS — R630 Anorexia: Secondary | ICD-10-CM | POA: Diagnosis not present

## 2014-06-30 LAB — CBC WITH DIFFERENTIAL/PLATELET
BASOS ABS: 0 10*3/uL (ref 0.0–0.1)
BASOS PCT: 0 % (ref 0–1)
EOS ABS: 0.1 10*3/uL (ref 0.0–0.7)
Eosinophils Relative: 1 % (ref 0–5)
HEMATOCRIT: 33.4 % — AB (ref 36.0–46.0)
Hemoglobin: 10.8 g/dL — ABNORMAL LOW (ref 12.0–15.0)
LYMPHS ABS: 1.4 10*3/uL (ref 0.7–4.0)
LYMPHS PCT: 16 % (ref 12–46)
MCH: 26 pg (ref 26.0–34.0)
MCHC: 32.3 g/dL (ref 30.0–36.0)
MCV: 80.3 fL (ref 78.0–100.0)
MONO ABS: 0.6 10*3/uL (ref 0.1–1.0)
Monocytes Relative: 7 % (ref 3–12)
Neutro Abs: 6.7 10*3/uL (ref 1.7–7.7)
Neutrophils Relative %: 76 % (ref 43–77)
Platelets: 294 10*3/uL (ref 150–400)
RBC: 4.16 MIL/uL (ref 3.87–5.11)
RDW: 15 % (ref 11.5–15.5)
WBC: 8.8 10*3/uL (ref 4.0–10.5)

## 2014-06-30 LAB — WET PREP, GENITAL
Trich, Wet Prep: NONE SEEN
WBC WET PREP: NONE SEEN
Yeast Wet Prep HPF POC: NONE SEEN

## 2014-06-30 LAB — URINALYSIS, ROUTINE W REFLEX MICROSCOPIC
GLUCOSE, UA: NEGATIVE mg/dL
KETONES UR: NEGATIVE mg/dL
Nitrite: NEGATIVE
PH: 6.5 (ref 5.0–8.0)
Protein, ur: NEGATIVE mg/dL
SPECIFIC GRAVITY, URINE: 1.027 (ref 1.005–1.030)
Urobilinogen, UA: 4 mg/dL — ABNORMAL HIGH (ref 0.0–1.0)

## 2014-06-30 LAB — COMPREHENSIVE METABOLIC PANEL
ALBUMIN: 3.5 g/dL (ref 3.5–5.2)
ALT: 14 U/L (ref 0–35)
AST: 19 U/L (ref 0–37)
Alkaline Phosphatase: 71 U/L (ref 39–117)
Anion gap: 9 (ref 5–15)
BILIRUBIN TOTAL: 0.3 mg/dL (ref 0.3–1.2)
BUN: 5 mg/dL — ABNORMAL LOW (ref 6–23)
CO2: 22 mmol/L (ref 19–32)
CREATININE: 0.52 mg/dL (ref 0.50–1.10)
Calcium: 9 mg/dL (ref 8.4–10.5)
Chloride: 106 mEq/L (ref 96–112)
GFR calc Af Amer: >90 mL/min (ref >90)
GFR calc non Af Amer: >90 mL/min (ref >90)
Glucose, Bld: 98 mg/dL (ref 70–99)
Potassium: 3.5 mmol/L (ref 3.5–5.1)
Sodium: 137 mmol/L (ref 135–145)
Total Protein: 7.2 g/dL (ref 6.0–8.3)

## 2014-06-30 LAB — URINE MICROSCOPIC-ADD ON

## 2014-06-30 LAB — URINE CULTURE

## 2014-06-30 LAB — PREGNANCY, URINE: PREG TEST UR: NEGATIVE

## 2014-06-30 MED ORDER — HYDROCODONE-ACETAMINOPHEN 5-325 MG PO TABS: 1.0000 | ORAL_TABLET | Freq: Four times a day (QID) | ORAL | Status: DC | PRN

## 2014-06-30 MED ORDER — MORPHINE SULFATE 4 MG/ML IJ SOLN
4.0000 mg | Freq: Once | INTRAMUSCULAR | Status: AC
  Administered 2014-06-30: 4 mg via INTRAVENOUS
  Filled 2014-06-30: qty 1

## 2014-06-30 MED ORDER — ONDANSETRON HCL 4 MG PO TABS: 4.0000 mg | ORAL_TABLET | Freq: Four times a day (QID) | ORAL | Status: DC

## 2014-06-30 MED ORDER — IOHEXOL 300 MG/ML  SOLN
25.0000 mL | Freq: Once | INTRAMUSCULAR | Status: AC | PRN
  Administered 2014-06-30: 25 mL via ORAL

## 2014-06-30 NOTE — ED Notes (Signed)
Pt. reports RLQ pain with nausea onset today , denies emesis or diarrhea , no fever or chills.

## 2014-06-30 NOTE — Telephone Encounter (Signed)
Post ED Visit - Positive Culture Follow-up  Culture report reviewed by antimicrobial stewardship pharmacist: []  Wes Dulaney, Pharm.D., BCPS []  Heide Guile, Pharm.D., BCPS [x]  Alycia Rossetti, Pharm.D., BCPS []  [place], [place].D., BCPS, AAHIVP []  Legrand Como, Pharm.D., BCPS, AAHIVP []  Isac Sarna, Pharm.D., BCPS  Positive urine culture E. Coli Treated with cephalexin, organism sensitive to the same and no further patient follow-up is required at this time.  Hazle Nordmann 06/30/2014, 10:07 AM

## 2014-06-30 NOTE — Discharge Instructions (Signed)
Abdominal Pain, Women °Abdominal (stomach, pelvic, or belly) pain can be caused by many things. It is important to tell your doctor: °· The location of the pain. °· Does it come and go or is it present all the time? °· Are there things that start the pain (eating certain foods, exercise)? °· Are there other symptoms associated with the pain (fever, nausea, vomiting, diarrhea)? °All of this is helpful to know when trying to find the cause of the pain. °CAUSES  °· Stomach: virus or bacteria infection, or ulcer. °· Intestine: appendicitis (inflamed appendix), regional ileitis (Crohn's disease), ulcerative colitis (inflamed colon), irritable bowel syndrome, diverticulitis (inflamed diverticulum of the colon), or cancer of the stomach or intestine. °· Gallbladder disease or stones in the gallbladder. °· Kidney disease, kidney stones, or infection. °· Pancreas infection or cancer. °· Fibromyalgia (pain disorder). °· Diseases of the female organs: °¨ Uterus: fibroid (non-cancerous) tumors or infection. °¨ Fallopian tubes: infection or tubal pregnancy. °¨ Ovary: cysts or tumors. °¨ Pelvic adhesions (scar tissue). °¨ Endometriosis (uterus lining tissue growing in the pelvis and on the pelvic organs). °¨ Pelvic congestion syndrome (female organs filling up with blood just before the menstrual period). °¨ Pain with the menstrual period. °¨ Pain with ovulation (producing an egg). °¨ Pain with an IUD (intrauterine device, birth control) in the uterus. °¨ Cancer of the female organs. °· Functional pain (pain not caused by a disease, may improve without treatment). °· Psychological pain. °· Depression. °DIAGNOSIS  °Your doctor will decide the seriousness of your pain by doing an examination. °· Blood tests. °· X-rays. °· Ultrasound. °· CT scan (computed tomography, special type of X-ray). °· MRI (magnetic resonance imaging). °· Cultures, for infection. °· Barium enema (dye inserted in the large intestine, to better view it with  X-rays). °· Colonoscopy (looking in intestine with a lighted tube). °· Laparoscopy (minor surgery, looking in abdomen with a lighted tube). °· Major abdominal exploratory surgery (looking in abdomen with a large incision). °TREATMENT  °The treatment will depend on the cause of the pain.  °· Many cases can be observed and treated at home. °· Over-the-counter medicines recommended by your caregiver. °· Prescription medicine. °· Antibiotics, for infection. °· Birth control pills, for painful periods or for ovulation pain. °· Hormone treatment, for endometriosis. °· Nerve blocking injections. °· Physical therapy. °· Antidepressants. °· Counseling with a psychologist or psychiatrist. °· Minor or major surgery. °HOME CARE INSTRUCTIONS  °· Do not take laxatives, unless directed by your caregiver. °· Take over-the-counter pain medicine only if ordered by your caregiver. Do not take aspirin because it can cause an upset stomach or bleeding. °· Try a clear liquid diet (broth or water) as ordered by your caregiver. Slowly move to a bland diet, as tolerated, if the pain is related to the stomach or intestine. °· Have a thermometer and take your temperature several times a day, and record it. °· Bed rest and sleep, if it helps the pain. °· Avoid sexual intercourse, if it causes pain. °· Avoid stressful situations. °· Keep your follow-up appointments and tests, as your caregiver orders. °· If the pain does not go away with medicine or surgery, you may try: °¨ Acupuncture. °¨ Relaxation exercises (yoga, meditation). °¨ Group therapy. °¨ Counseling. °SEEK MEDICAL CARE IF:  °· You notice certain foods cause stomach pain. °· Your home care treatment is not helping your pain. °· You need stronger pain medicine. °· You want your IUD removed. °· You feel faint or   lightheaded. °· You develop nausea and vomiting. °· You develop a rash. °· You are having side effects or an allergy to your medicine. °SEEK IMMEDIATE MEDICAL CARE IF:  °· Your  pain does not go away or gets worse. °· You have a fever. °· Your pain is felt only in portions of the abdomen. The right side could possibly be appendicitis. The left lower portion of the abdomen could be colitis or diverticulitis. °· You are passing blood in your stools (bright red or black tarry stools, with or without vomiting). °· You have blood in your urine. °· You develop chills, with or without a fever. °· You pass out. °MAKE SURE YOU:  °· Understand these instructions. °· Will watch your condition. °· Will get help right away if you are not doing well or get worse. °Document Released: 04/02/2007 Document Revised: 10/20/2013 Document Reviewed: 04/22/2009 °Exit[employer]® Patient Information ©2015 [employer]. This information is not intended to replace advice given to you by your health care provider. Make sure you discuss any questions you have with your health care provider. ° °

## 2014-07-01 ENCOUNTER — Encounter (HOSPITAL_COMMUNITY): Payer: Self-pay | Admitting: *Deleted

## 2014-07-01 ENCOUNTER — Other Ambulatory Visit (HOSPITAL_COMMUNITY): Payer: Self-pay | Admitting: Emergency Medicine

## 2014-07-01 ENCOUNTER — Telehealth (HOSPITAL_BASED_OUTPATIENT_CLINIC_OR_DEPARTMENT_OTHER): Payer: Self-pay | Admitting: Emergency Medicine

## 2014-07-01 ENCOUNTER — Emergency Department (HOSPITAL_COMMUNITY): Payer: BLUE CROSS/BLUE SHIELD

## 2014-07-01 ENCOUNTER — Ambulatory Visit (HOSPITAL_COMMUNITY)
Admission: RE | Admit: 2014-07-01 | Discharge: 2014-07-01 | Disposition: A | Discharge status: Home / Self Care | Payer: BLUE CROSS/BLUE SHIELD | Source: Ambulatory Visit | Attending: Emergency Medicine | Admitting: Emergency Medicine

## 2014-07-01 DIAGNOSIS — R1031 Right lower quadrant pain: Secondary | ICD-10-CM | POA: Diagnosis not present

## 2014-07-01 DIAGNOSIS — R109 Unspecified abdominal pain: Secondary | ICD-10-CM

## 2014-07-01 MED ORDER — MORPHINE SULFATE 4 MG/ML IJ SOLN
INTRAMUSCULAR | Status: AC
  Administered 2014-07-01: 4 mg
  Filled 2014-07-01: qty 1

## 2014-07-01 MED ORDER — IOHEXOL 300 MG/ML  SOLN
100.0000 mL | Freq: Once | INTRAMUSCULAR | Status: AC | PRN
  Administered 2014-07-01: 100 mL via INTRAVENOUS

## 2014-07-01 NOTE — ED Provider Notes (Signed)
Patient is resting comfortably. Vital signs remained stable in the emergency department. Ultrasound demonstrates right ovarian cyst without torsion. Patient advised to follow-up with gynecologist. Return precautions given.  Julianne Rice, MD 07/01/14 [phone]

## 2014-07-01 NOTE — Telephone Encounter (Signed)
Post ED Visit - Positive Culture Follow-up  Culture report reviewed by antimicrobial stewardship pharmacist: []  Wes Donnella Sham, Pharm.D., BCPS []  Heide Guile, Pharm.D., BCPS [x]  Alycia Rossetti, Pharm.D., BCPS []  [facility], Pharm.D., BCPS, AAHIVP []  Legrand Como, Pharm.D., BCPS, AAHIVP []  Isac Sarna, Pharm.D., BCPS  Positive urine culture Treated with cephalexin, organism sensitive to the same and no further patient follow-up is required at this time.  Hazle Nordmann 07/01/2014, 3:28 PM

## 2014-07-01 NOTE — Telephone Encounter (Signed)
Post ED Visit - Positive Culture Follow-up  Culture report reviewed by antimicrobial stewardship pharmacist: []  Wes [place], Pharm.D., BCPS []  Heide Guile, Pharm.D., BCPS [x]  Alycia Rossetti, Pharm.D., BCPS []  [place], Pharm.D., BCPS, AAHIVP []  Legrand Como, Pharm.D., BCPS, AAHIVP []  Isac Sarna, Pharm.D., BCPS  Positive urine culture E. Coli Treated with cephalexin, organism sensitive to the same and no further patient follow-up is required at this time.  Hazle Nordmann 07/01/2014, 3:26 PM

## 2014-07-01 NOTE — ED Provider Notes (Signed)
CSN: [id number]     Arrival date & time 06/30/14  2005 History   First MD Initiated Contact with Patient 06/30/14 2128     Chief Complaint  Patient presents with  . Abdominal Pain     (Consider location/radiation/quality/duration/timing/severity/associated sxs/prior Treatment) Patient is a 29 y.o. female presenting with abdominal pain. The history is provided by the patient. No language interpreter was used.  Abdominal Pain Pain location:  RLQ Pain quality: aching   Pain radiates to:  Does not radiate Pain severity:  Severe Onset quality:  Sudden Duration: starting at 12:30. Timing:  Constant Progression:  Unchanged Chronicity:  New Context comment:  Diagnosed with UTI earlier this week when was  seen for suprapubic pain, given Rx for keflex which she filled yesterday Relieved by:  Nothing Worsened by:  Movement and coughing Ineffective treatments:  None tried Associated symptoms: anorexia and nausea   Associated symptoms: no chest pain, no chills, no constipation, no cough, no diarrhea, no dysuria, no fatigue, no fever, no shortness of breath, no sore throat, no vaginal bleeding, no vaginal discharge and no vomiting     Past Medical History  Diagnosis Date  . Kidney stones   . Kidney stones    Past Surgical History  Procedure Laterality Date  . Renal artery stent      placed and removed in 2005  . Cesarean section      Nov 2011   Family History  Problem Relation Age of Onset  . Hypertension Mother   . Depression Mother   . Diabetes Father   . Hypertension Father   . Heart disease Father   . Cancer Father    History  Substance Use Topics  . Smoking status: Never Smoker   . Smokeless tobacco: Not on file  . Alcohol Use: No   OB History    Gravida Para Term Preterm AB TAB SAB Ectopic Multiple Living   2 1 1  1  1   1      Review of Systems  Constitutional: Negative for fever, chills, diaphoresis, activity change, appetite change and fatigue.  HENT: Negative  for congestion, facial swelling, rhinorrhea and sore throat.   Eyes: Negative for photophobia and discharge.  Respiratory: Negative for cough, chest tightness and shortness of breath.   Cardiovascular: Negative for chest pain, palpitations and leg swelling.  Gastrointestinal: Positive for nausea, abdominal pain and anorexia. Negative for vomiting, diarrhea and constipation.  Endocrine: Negative for polydipsia and polyuria.  Genitourinary: Negative for dysuria, frequency, vaginal bleeding, vaginal discharge, difficulty urinating and pelvic pain.  Musculoskeletal: Negative for back pain, arthralgias, neck pain and neck stiffness.  Skin: Negative for color change and wound.  Allergic/Immunologic: Negative for immunocompromised state.  Neurological: Negative for facial asymmetry, weakness, numbness and headaches.  Hematological: Does not bruise/bleed easily.  Psychiatric/Behavioral: Negative for confusion and agitation.      Allergies  Erythromycin base; Ciprofloxacin; and Watermelon flavor  Home Medications   Prior to Admission medications   Medication Sig Start Date End Date Taking? Authorizing Provider  acetaminophen (TYLENOL) 500 MG tablet Take 1,000 mg by mouth every 6 (six) hours as needed for moderate pain.   Yes Historical Provider, MD  Aspirin-Acetaminophen-Caffeine (GOODY HEADACHE PO) Take 1 Package by mouth daily as needed (for pain).   Yes Historical Provider, MD  Aspirin-Salicylamide-Caffeine (BC HEADACHE POWDER PO) Take 1 Package by mouth daily as needed (for pain).   Yes Historical Provider, MD  cephALEXin (KEFLEX) 500 MG capsule Take 1 capsule (500  mg total) by mouth 3 (three) times daily. 06/27/14  Yes Hoy Morn, MD  diphenhydrAMINE (BENADRYL) 25 MG tablet Take 25 mg by mouth every 6 (six) hours as needed for allergies.   Yes Historical Provider, MD  HYDROcodone-acetaminophen (NORCO) 5-325 MG per tablet Take 1-2 tablets by mouth every 6 (six) hours as needed. 06/30/14    Ernestina Patches, MD  naproxen (NAPROSYN) 500 MG tablet Take 1 tablet (500 mg total) by mouth 2 (two) times daily. 06/27/14   Hoy Morn, MD  ondansetron (ZOFRAN) 4 MG tablet Take 1 tablet (4 mg total) by mouth every 6 (six) hours. 06/30/14   Ernestina Patches, MD  phenazopyridine (PYRIDIUM) 200 MG tablet Take 1 tablet (200 mg total) by mouth 3 (three) times daily. 06/27/14   Hoy Morn, MD   BP 117/63 mmHg  Pulse 89  Temp(Src) 99 F (37.2 C) (Oral)  Resp 15  Ht 5\' 1"  (1.549 m)  Wt 188 lb (85.276 kg)  BMI 35.54 kg/m2  SpO2 96%  LMP 06/11/2014 Physical Exam  Constitutional: She is oriented to person, place, and time. She appears well-developed and well-nourished. No distress.  HENT:  Head: Normocephalic and atraumatic.  Mouth/Throat: No oropharyngeal exudate.  Eyes: Pupils are equal, round, and reactive to light.  Neck: Normal range of motion. Neck supple.  Cardiovascular: Normal rate, regular rhythm and normal heart sounds.  Exam reveals no gallop and no friction rub.   No murmur heard. Pulmonary/Chest: Effort normal and breath sounds normal. No respiratory distress. She has no wheezes. She has no rales.  Abdominal: Soft. Bowel sounds are normal. She exhibits no distension and no mass. There is tenderness in the right lower quadrant. There is no rigidity, no rebound and no guarding.  Musculoskeletal: Normal range of motion. She exhibits no edema or tenderness.  Neurological: She is alert and oriented to person, place, and time.  Skin: Skin is warm and dry.  Psychiatric: She has a normal mood and affect.    ED Course  Procedures (including critical care time) Labs Review Labs Reviewed  WET PREP, GENITAL - Abnormal; Notable for the following:    Clue Cells Wet Prep HPF POC MANY (*)    All other components within normal limits  CBC WITH DIFFERENTIAL - Abnormal; Notable for the following:    Hemoglobin 10.8 (*)    HCT 33.4 (*)    All other components within normal limits   COMPREHENSIVE METABOLIC PANEL - Abnormal; Notable for the following:    BUN 5 (*)    All other components within normal limits  URINALYSIS, ROUTINE W REFLEX MICROSCOPIC - Abnormal; Notable for the following:    APPearance CLOUDY (*)    Hgb urine dipstick SMALL (*)    Bilirubin Urine SMALL (*)    Urobilinogen, UA 4.0 (*)    Leukocytes, UA SMALL (*)    All other components within normal limits  URINE MICROSCOPIC-ADD ON - Abnormal; Notable for the following:    Squamous Epithelial / LPF FEW (*)    Bacteria, UA FEW (*)    Crystals CA OXALATE CRYSTALS (*)    All other components within normal limits  GC/CHLAMYDIA PROBE AMP  PREGNANCY, URINE    Imaging Review US Transvaginal Non-ob  07/01/2014   CLINICAL DATA:  Acute onset of right lower quadrant abdominal pain. Large right adnexal cyst noted on CT. Initial encounter.  EXAM: TRANSABDOMINAL AND TRANSVAGINAL ULTRASOUND OF PELVIS  DOPPLER ULTRASOUND OF OVARIES  TECHNIQUE: Both transabdominal and transvaginal  ultrasound examinations of the pelvis were performed. Transabdominal technique was performed for global imaging of the pelvis including uterus, ovaries, adnexal regions, and pelvic cul-de-sac.  It was necessary to proceed with endovaginal exam following the transabdominal exam to visualize the uterus and ovaries in greater detail. Color and duplex Doppler ultrasound was utilized to evaluate blood flow to the ovaries.  COMPARISON:  CT of the abdomen and pelvis performed earlier today at 2:11 a.m., and pelvic ultrasound performed 06/05/2014  FINDINGS: Uterus  Measurements: 9.0 x 4.0 x 4.3 cm. No fibroids or other mass visualized.  Endometrium  Not well characterized.  Right ovary  Measurements: 7.8 x 6.9 x 4.7 cm. There is a 5.0 x 4.7 x 4.1 cm simple-appearing cyst at the right ovary.  Left ovary  Not visualized on this study.  Pulsed Doppler evaluation of the right ovary demonstrates normal low-resistance arterial and venous waveforms.  Other  findings  A moderate amount of slightly complex free fluid is seen within the pelvis.  IMPRESSION: 1. Moderate amount of slightly complex free fluid noted within the pelvis. This might reflect a recently ruptured cyst, or may be physiologic in nature. 2. Apparent 5.0 cm simple cyst at the right ovary. 3. No evidence of ovarian torsion. Uterus unremarkable in appearance. The left ovary is not visualized, but was grossly unremarkable in appearance on recent CT.   Electronically Signed   By: Garald Balding M.D.   On: 07/01/2014 09:21   US Pelvis Complete  07/01/2014   CLINICAL DATA:  Acute onset of right lower quadrant abdominal pain. Large right adnexal cyst noted on CT. Initial encounter.  EXAM: TRANSABDOMINAL AND TRANSVAGINAL ULTRASOUND OF PELVIS  DOPPLER ULTRASOUND OF OVARIES  TECHNIQUE: Both transabdominal and transvaginal ultrasound examinations of the pelvis were performed. Transabdominal technique was performed for global imaging of the pelvis including uterus, ovaries, adnexal regions, and pelvic cul-de-sac.  It was necessary to proceed with endovaginal exam following the transabdominal exam to visualize the uterus and ovaries in greater detail. Color and duplex Doppler ultrasound was utilized to evaluate blood flow to the ovaries.  COMPARISON:  CT of the abdomen and pelvis performed earlier today at 2:11 a.m., and pelvic ultrasound performed 06/05/2014  FINDINGS: Uterus  Measurements: 9.0 x 4.0 x 4.3 cm. No fibroids or other mass visualized.  Endometrium  Not well characterized.  Right ovary  Measurements: 7.8 x 6.9 x 4.7 cm. There is a 5.0 x 4.7 x 4.1 cm simple-appearing cyst at the right ovary.  Left ovary  Not visualized on this study.  Pulsed Doppler evaluation of the right ovary demonstrates normal low-resistance arterial and venous waveforms.  Other findings  A moderate amount of slightly complex free fluid is seen within the pelvis.  IMPRESSION: 1. Moderate amount of slightly complex free fluid  noted within the pelvis. This might reflect a recently ruptured cyst, or may be physiologic in nature. 2. Apparent 5.0 cm simple cyst at the right ovary. 3. No evidence of ovarian torsion. Uterus unremarkable in appearance. The left ovary is not visualized, but was grossly unremarkable in appearance on recent CT.   Electronically Signed   By: Garald Balding M.D.   On: 07/01/2014 09:21   Ct Abdomen Pelvis W Contrast  07/01/2014   CLINICAL DATA:  Acute onset right lower quadrant abdominal pain. Initial encounter.  EXAM: CT ABDOMEN AND PELVIS WITH CONTRAST  TECHNIQUE: Multidetector CT imaging of the abdomen and pelvis was performed using the standard protocol following bolus administration of intravenous  contrast.  CONTRAST:  100 mL of Omnipaque 300 IV contrast  COMPARISON:  CT of the abdomen and pelvis from 03/11/2008, and pelvic ultrasound performed 06/05/2014  FINDINGS: Minimal bibasilar atelectasis is noted.  The liver and spleen are unremarkable in appearance. The gallbladder is within normal limits. The pancreas and adrenal glands are unremarkable.  The kidneys are unremarkable in appearance. There is no evidence of hydronephrosis. No renal or ureteral stones are seen. No perinephric stranding is appreciated.  The small bowel is unremarkable in appearance. The stomach is within normal limits. No acute vascular abnormalities are seen.  The appendix is normal in caliber and contains trace air, without evidence for appendicitis. Contrast progresses to the level of the mid transverse colon. The colon is unremarkable in appearance.  The bladder is moderately distended and grossly unremarkable. The uterus is unremarkable in appearance. A 5.1 x 4.7 cm right adnexal cystic lesion is noted, adjacent to the cecum, likely arising from the right ovary. The left ovary is unremarkable in appearance. Trace free fluid is noted within the pelvis and tracking superiorly to the inferior tip of the liver. No inguinal  lymphadenopathy is seen.  No acute osseous abnormalities are identified.  IMPRESSION: 1. Large right adnexal cystic lesion noted, measuring 5.1 x 4.7 cm, with trace associated fluid. This is seen directly adjacent to the cecum. Trace fluid tracks superiorly to the inferior tip of the liver. This is thought to arise from the right ovary. Pelvic ultrasound would be helpful for further evaluation, given the patient's symptoms. 2. Appendix unremarkable in appearance.  These results were called by telephone at the time of interpretation on 07/01/2014 at 2:36 am to Dr. Mingo Amber, who verbally acknowledged these results.   Electronically Signed   By: Garald Balding M.D.   On: 07/01/2014 02:38   [place] Art/ven Flow Abd Pelv Doppler  07/01/2014   CLINICAL DATA:  Acute onset of right lower quadrant abdominal pain. Large right adnexal cyst noted on CT. Initial encounter.  EXAM: TRANSABDOMINAL AND TRANSVAGINAL ULTRASOUND OF PELVIS  DOPPLER ULTRASOUND OF OVARIES  TECHNIQUE: Both transabdominal and transvaginal ultrasound examinations of the pelvis were performed. Transabdominal technique was performed for global imaging of the pelvis including uterus, ovaries, adnexal regions, and pelvic cul-de-sac.  It was necessary to proceed with endovaginal exam following the transabdominal exam to visualize the uterus and ovaries in greater detail. Color and duplex Doppler ultrasound was utilized to evaluate blood flow to the ovaries.  COMPARISON:  CT of the abdomen and pelvis performed earlier today at 2:11 a.m., and pelvic ultrasound performed 06/05/2014  FINDINGS: Uterus  Measurements: 9.0 x 4.0 x 4.3 cm. No fibroids or other mass visualized.  Endometrium  Not well characterized.  Right ovary  Measurements: 7.8 x 6.9 x 4.7 cm. There is a 5.0 x 4.7 x 4.1 cm simple-appearing cyst at the right ovary.  Left ovary  Not visualized on this study.  Pulsed Doppler evaluation of the right ovary demonstrates normal low-resistance arterial and venous  waveforms.  Other findings  A moderate amount of slightly complex free fluid is seen within the pelvis.  IMPRESSION: 1. Moderate amount of slightly complex free fluid noted within the pelvis. This might reflect a recently ruptured cyst, or may be physiologic in nature. 2. Apparent 5.0 cm simple cyst at the right ovary. 3. No evidence of ovarian torsion. Uterus unremarkable in appearance. The left ovary is not visualized, but was grossly unremarkable in appearance on recent CT.   Electronically Signed  By: Garald Balding M.D.   On: 07/01/2014 09:21     EKG Interpretation None      MDM   Final diagnoses:  RLQ abdominal pain    Pt is a 29 y.o. female with Pmhx as above who presents with RLQ pain since 12:30pm today. Pain worse w/ mvmt, coughing. No assoc n/v, fever, chills, vag bleeding or d/c. Recently seen for lower ab pain, treated w/ UTI, has filled her keflex Rx yesterday.  On PE, VSS, pt in NAD. +RLQ ttp w/o rebound or guarding. On pelvic, pt has white vaginal d/c but no adnexal or CMT.  CT ab pelvis ordered, will be followed up by Dr. Lita Mains. If negative, can be d/c'd home with plan to continue abx and f/u with her GYN as scheduled on Friday.      Lacretia A Bigelow evaluation in the Emergency Department is complete. It has been determined that no acute conditions requiring further emergency intervention are present at this time. The patient/guardian have been advised of the diagnosis and plan. We have discussed signs and symptoms that warrant return to the ED, such as changes or worsening in symptoms, worsening pain, fever, inability to tolerate PO.       Ernestina Patches, MD 07/01/14 1149

## 2014-07-01 NOTE — ED Notes (Signed)
Follow up on [place] results called, [place] staff will call back.

## 2014-07-02 LAB — GC/CHLAMYDIA PROBE AMP
CT PROBE, AMP APTIMA: NEGATIVE
GC Probe RNA: NEGATIVE

## 2014-07-03 ENCOUNTER — Encounter: Payer: Self-pay | Admitting: Obstetrics & Gynecology

## 2014-07-03 ENCOUNTER — Ambulatory Visit (INDEPENDENT_AMBULATORY_CARE_PROVIDER_SITE_OTHER): Payer: BLUE CROSS/BLUE SHIELD | Admitting: Obstetrics & Gynecology

## 2014-07-03 VITALS — BP 112/74 | HR 82 | Ht 61.0 in | Wt 192.0 lb

## 2014-07-03 DIAGNOSIS — Z3049 Encounter for surveillance of other contraceptives: Secondary | ICD-10-CM

## 2014-07-03 DIAGNOSIS — R635 Abnormal weight gain: Secondary | ICD-10-CM | POA: Diagnosis not present

## 2014-07-03 NOTE — Progress Notes (Signed)
   Subjective:    Patient ID: [name], female    DOB: 1986/06/08, 29 y.o.   MRN: [id number]  HPI This 29 yo lady is here to have her Implanon removed. It has been in for 4 years and she has gained 50 # since it was placed in her right arm.   Review of Systems     Objective:   Physical Exam  Consent was signed and time out was done. Her right arm was prepped with betadine after establishing the position of the Nexplanon. The area was infiltrated with 2 cc of 1% lidocaine. A small incision was made and the intact rod was easily removed. A steristrip was placed and her arm was noted to be hemostatic. It was bandaged.  She tolerated the procedure well.      Assessment & Plan:  50# weight gain- check TSH Desire for pregnancy- Rec continue MVI to help prevent ONTDs. RTC prn. I explained that the average couple takes 1 year to conceive.

## 2014-07-03 NOTE — Progress Notes (Signed)
Here today for Implanon removal, device has been in for four years.  Also needs follow up for recent ruptured ovarian cyst.

## 2014-07-04 LAB — TSH: TSH: 1.989 u[IU]/mL (ref 0.350–4.500)

## 2014-07-25 ENCOUNTER — Encounter (HOSPITAL_COMMUNITY): Payer: Self-pay | Admitting: *Deleted

## 2014-07-25 ENCOUNTER — Emergency Department (HOSPITAL_COMMUNITY)
Admission: EM | Admit: 2014-07-25 | Discharge: 2014-07-25 | Disposition: A | Discharge status: Home / Self Care | Payer: BLUE CROSS/BLUE SHIELD | Attending: Emergency Medicine | Admitting: Emergency Medicine

## 2014-07-25 DIAGNOSIS — Z791 Long term (current) use of non-steroidal anti-inflammatories (NSAID): Secondary | ICD-10-CM | POA: Diagnosis not present

## 2014-07-25 DIAGNOSIS — M940 Chondrocostal junction syndrome [Tietze]: Secondary | ICD-10-CM | POA: Diagnosis not present

## 2014-07-25 DIAGNOSIS — Z87442 Personal history of urinary calculi: Secondary | ICD-10-CM | POA: Diagnosis not present

## 2014-07-25 DIAGNOSIS — Z79899 Other long term (current) drug therapy: Secondary | ICD-10-CM | POA: Insufficient documentation

## 2014-07-25 DIAGNOSIS — Z792 Long term (current) use of antibiotics: Secondary | ICD-10-CM | POA: Insufficient documentation

## 2014-07-25 DIAGNOSIS — N644 Mastodynia: Secondary | ICD-10-CM | POA: Insufficient documentation

## 2014-07-25 MED ORDER — NAPROXEN 500 MG PO TABS: 500.0000 mg | ORAL_TABLET | Freq: Two times a day (BID) | ORAL | Status: DC

## 2014-07-25 NOTE — ED Notes (Signed)
Pt reports right breast pain that started yesterday, pt reports clear drainage from her nipple as well.

## 2014-07-25 NOTE — Discharge Instructions (Signed)
Breast Tenderness Breast tenderness is a common problem for women of all ages. Breast tenderness may cause mild discomfort to severe pain. It has a variety of causes. Your health care provider will find out the likely cause of your breast tenderness by examining your breasts, asking you about symptoms, and ordering some tests. Breast tenderness usually does not mean you have breast cancer. HOME CARE INSTRUCTIONS  Breast tenderness often can be handled at home. You can try:  Getting fitted for a new bra that provides more support, especially during exercise.  Wearing a more supportive bra or sports bra while sleeping when your breasts are very tender.  If you have a breast injury, apply ice to the area:  Put ice in a plastic bag.  Place a towel between your skin and the bag.  Leave the ice on for 20 minutes, 2-3 times a day.  If your breasts are too full of milk as a result of breastfeeding, try:  Expressing milk either by hand or with a breast pump.  Applying a warm compress to the breasts for relief.  Taking over-the-counter pain relievers, if approved by your health care provider.  Taking other medicines that your health care provider prescribes. These may include antibiotic medicines or birth control pills. Over the long term, your breast tenderness might be eased if you:  Cut down on caffeine.  Reduce the amount of fat in your diet. Keep a log of the days and times when your breasts are most tender. This will help you and your health care provider find the cause of the tenderness and how to relieve it. Also, learn how to do breast exams at home. This will help you notice if you have an unusual growth or lump that could cause tenderness. SEEK MEDICAL CARE IF:   Any part of your breast is hard, red, and hot to the touch. This could be a sign of infection.  Fluid is coming out of your nipples (and you are not breastfeeding). Especially watch for blood or pus.  You have a fever  as well as breast tenderness.  You have a new or painful lump in your breast that remains after your menstrual period ends.  You have tried to take care of the pain at home, but it has not gone away.  Your breast pain is getting worse, or the pain is making it hard to do the things you usually do during your day. Document Released: 05/18/2008 Document Revised: 02/05/2013 Document Reviewed: 01/02/2013 [facility] Patient Information 2015 [place], [place]. This information is not intended to replace advice given to you by your health care provider. Make sure you discuss any questions you have with your health care provider.  You were evaluated in the ED today for your breast pain. There does not appear to be an emergent cause for your discomfort at this time. It is important for you to follow-up with your GYN and/or your PCP for further evaluation and management of your symptoms. You also had some evidence of inflammation in between your ribs that may be contributing to your discomfort. It is important for you to take her anti-inflammatories as directed for this discomfort. Return to ED for new or worsening symptoms.

## 2014-07-25 NOTE — ED Notes (Signed)
The pt is c/o rt breast pain for one week.  She also has swelling.  She just had a iud removed that she has had for 4 years

## 2014-07-25 NOTE — ED Provider Notes (Signed)
CSN: [id number]     Arrival date & time 07/25/14  2238 History  This chart was scribed for Comer Locket, PA-C working with No att. providers found by Mercy Moore, ED Scribe. This patient was seen in room TR07C/TR07C and the patient's care was started at 11:04 PM.   Chief Complaint  Patient presents with  . Breast Pain   The history is provided by the patient. No language interpreter was used.   HPI Comments: [name] is a 29 y.o. female who presents to the Emergency Department complaining of worsening right breast swelling, onset one week ago. Patient reports throbbing pain yesterday and clear nipple discharge. Patient reports alleviation with gentle massage and warm showers. Aggravating factors include movement and applied pressure from friction/contact. Patient reports that her breast is so swollen and tender wearing a bra is uncomfortable. Patient denies redness and lactation stating she has not had a child in four years. Patient reports history of kidney stones. Allergies included erythromycin and Ciprofloxacin.  Patient recently had an IUD removed by her GYN last week.    Past Medical History  Diagnosis Date  . Kidney stones   . Kidney stones    Past Surgical History  Procedure Laterality Date  . Renal artery stent      placed and removed in 2005  . Cesarean section      Nov 2011   Family History  Problem Relation Age of Onset  . Hypertension Mother   . Depression Mother   . Diabetes Father   . Hypertension Father   . Heart disease Father   . Cancer Father    History  Substance Use Topics  . Smoking status: Never Smoker   . Smokeless tobacco: Not on file  . Alcohol Use: No   OB History    Gravida Para Term Preterm AB TAB SAB Ectopic Multiple Living   2 1 1  1  1   1      Review of Systems  Constitutional: Negative for fever and chills.  Respiratory: Negative for cough and shortness of breath.        Breast pain  Cardiovascular: Negative for chest  pain.  Skin: Negative for color change, rash and wound.   Allergies  Erythromycin base; Ciprofloxacin; and Watermelon flavor  Home Medications   Prior to Admission medications   Medication Sig Start Date End Date Taking? Authorizing Provider  acetaminophen (TYLENOL) 500 MG tablet Take 1,000 mg by mouth every 6 (six) hours as needed for moderate pain.   Yes Historical Provider, MD  Aspirin-Acetaminophen-Caffeine (GOODY HEADACHE PO) Take 1 Package by mouth daily as needed (for pain).   Yes Historical Provider, MD  Aspirin-Salicylamide-Caffeine (BC HEADACHE POWDER PO) Take 1 Package by mouth daily as needed (for pain).   Yes Historical Provider, MD  diphenhydrAMINE (BENADRYL) 25 MG tablet Take 25 mg by mouth every 6 (six) hours as needed for allergies.   Yes Historical Provider, MD  HYDROcodone-acetaminophen (NORCO) 5-325 MG per tablet Take 1-2 tablets by mouth every 6 (six) hours as needed. Patient taking differently: Take 1-2 tablets by mouth every 6 (six) hours as needed.  06/30/14  Yes Ernestina Patches, MD  ibuprofen (ADVIL,MOTRIN) 200 MG tablet Take 600 mg by mouth every 6 (six) hours as needed for moderate pain.   Yes Historical Provider, MD  cephALEXin (KEFLEX) 500 MG capsule Take 1 capsule (500 mg total) by mouth 3 (three) times daily. Patient not taking: Reported on 07/25/2014 06/27/14   Metta Clines  Campos, MD  naproxen (NAPROSYN) 500 MG tablet Take 1 tablet (500 mg total) by mouth 2 (two) times daily. 07/25/14   Viona Gilmore Tamarcus Condie, PA-C  ondansetron (ZOFRAN) 4 MG tablet Take 1 tablet (4 mg total) by mouth every 6 (six) hours. Patient not taking: Reported on 07/25/2014 06/30/14   Ernestina Patches, MD  phenazopyridine (PYRIDIUM) 200 MG tablet Take 1 tablet (200 mg total) by mouth 3 (three) times daily. Patient not taking: Reported on 07/25/2014 06/27/14   Hoy Morn, MD   Triage Vitals: BP 125/71 mmHg  Pulse 75  Temp(Src) 97.5 F (36.4 C) (Oral)  Resp 16  Ht 5' 0.5" (1.537 m)  Wt 183 lb (83.008 kg)   BMI 35.14 kg/m2  SpO2 99%  LMP 07/06/2014 Physical Exam  Constitutional: She is oriented to person, place, and time. She appears well-developed and well-nourished. No distress.  HENT:  Head: Normocephalic and atraumatic.  Eyes: EOM are normal.  Neck: Neck supple. No tracheal deviation present.  Cardiovascular: Normal rate, regular rhythm, S1 normal, S2 normal and normal heart sounds.   Pulmonary/Chest: Effort normal and breath sounds normal. No respiratory distress.  Chaperone present for entire duration of breast examination. There is no overt erythema or swelling to breasts. Tender in the intercostal spaces along the right sternum. Tenderness along inferior aspect of right pectoral. No overt erythema, warmth, or nipple discharge. No evidence of abscess. mild bilateral axillary lymphoadenopathy.   Musculoskeletal: Normal range of motion.  Neurological: She is alert and oriented to person, place, and time.  Skin: Skin is warm and dry.  Psychiatric: She has a normal mood and affect. Her behavior is normal.  Nursing note and vitals reviewed.   ED Course  Procedures (including critical care time)  COORDINATION OF CARE: 11:16 PM- Will treat with anti inflammatory with plans to follow up with GYN. Discussed treatment plan with patient at bedside and patient agreed to plan.   Labs Review Labs Reviewed - No data to display  Imaging Review No results found.   EKG Interpretation None     Meds given in ED:  Medications - No data to display  Discharge Medication List as of 07/25/2014 11:22 PM     Filed Vitals:   07/25/14 2242  BP: 125/71  Pulse: 75  Temp: 97.5 F (36.4 C)  TempSrc: Oral  Resp: 16  Height: 5' 0.5" (1.537 m)  Weight: 183 lb (83.008 kg)  SpO2: 99%    MDM  Vitals stable - WNL -afebrile Pt resting comfortably in ED. PE--breast exam not concerning for mastitis, abscess. Tenderness to intercostal spaces in right chest  DDX--patient presents with right breast  discomfort, exam consistent with costochondritis the right chest and potential muscle strain. Discussed follow-up with her GYN she reports that she will make an appointment next week. Low concern for other acute or emergent pathology. Will DC with anti-inflammatories for any discomfort she may be experiencing. I discussed all relevant lab findings and imaging results with pt and they verbalized understanding. Discussed f/u with PCP within 48 hrs and return precautions, pt very amenable to plan.  Final diagnoses:  Breast pain  Costochondritis   I personally performed the services described in this documentation, which was scribed in my presence. The recorded information has been reviewed and is accurate.    Viona Gilmore [place], PA-C 07/26/14 0122  Charlesetta Shanks, MD 07/28/14 [phone]

## 2014-08-11 ENCOUNTER — Ambulatory Visit (INDEPENDENT_AMBULATORY_CARE_PROVIDER_SITE_OTHER): Payer: BLUE CROSS/BLUE SHIELD | Admitting: Obstetrics & Gynecology

## 2014-08-11 ENCOUNTER — Encounter: Payer: Self-pay | Admitting: Obstetrics & Gynecology

## 2014-08-11 VITALS — BP 109/73 | HR 81 | Ht 61.0 in | Wt 194.8 lb

## 2014-08-11 DIAGNOSIS — N926 Irregular menstruation, unspecified: Secondary | ICD-10-CM

## 2014-08-11 NOTE — Progress Notes (Signed)
Patient had normal period in January from the 18th.  She then had a positive pregnancy on  February 16th, followed by a small amount of bleeding from the 19-21st and two negative pregnancy test to follow.  She is here today to be sure if anything needs to be done.  She is also having bleeding after sex that presents the morning after but just a small amount of blood.

## 2014-08-11 NOTE — Progress Notes (Signed)
   Subjective:    Patient ID: [name], female    DOB: 1986/02/17, 29 y.o.   MRN: [id number]  HPI [name] is here because she is trying to conceive and had a faintly positive pregnancy test recently. She would like to know if she is pregant.  Review of Systems     Objective:   Physical Exam  [id number] Ambulating and speaking normally      Assessment & Plan:  Irregular bleeding- check [facility]

## 2014-08-12 LAB — HCG, QUANTITATIVE, PREGNANCY: hCG, Beta Chain, Quant, S: <2 m[IU]/mL

## 2014-09-08 ENCOUNTER — Emergency Department (HOSPITAL_COMMUNITY): Payer: BLUE CROSS/BLUE SHIELD

## 2014-09-08 ENCOUNTER — Encounter (HOSPITAL_COMMUNITY): Payer: Self-pay | Admitting: Emergency Medicine

## 2014-09-08 ENCOUNTER — Emergency Department (HOSPITAL_COMMUNITY)
Admission: EM | Admit: 2014-09-08 | Discharge: 2014-09-09 | Disposition: A | Discharge status: Home / Self Care | Payer: BLUE CROSS/BLUE SHIELD | Attending: Emergency Medicine | Admitting: Emergency Medicine

## 2014-09-08 DIAGNOSIS — M549 Dorsalgia, unspecified: Secondary | ICD-10-CM | POA: Diagnosis not present

## 2014-09-08 DIAGNOSIS — B9689 Other specified bacterial agents as the cause of diseases classified elsewhere: Secondary | ICD-10-CM

## 2014-09-08 DIAGNOSIS — N760 Acute vaginitis: Secondary | ICD-10-CM | POA: Diagnosis not present

## 2014-09-08 DIAGNOSIS — Z87442 Personal history of urinary calculi: Secondary | ICD-10-CM | POA: Diagnosis not present

## 2014-09-08 DIAGNOSIS — R109 Unspecified abdominal pain: Secondary | ICD-10-CM

## 2014-09-08 DIAGNOSIS — Z3202 Encounter for pregnancy test, result negative: Secondary | ICD-10-CM | POA: Diagnosis not present

## 2014-09-08 LAB — URINALYSIS, ROUTINE W REFLEX MICROSCOPIC
BILIRUBIN URINE: NEGATIVE
GLUCOSE, UA: NEGATIVE mg/dL
Hgb urine dipstick: NEGATIVE
Ketones, ur: NEGATIVE mg/dL
Nitrite: NEGATIVE
PH: 7 (ref 5.0–8.0)
Protein, ur: NEGATIVE mg/dL
Specific Gravity, Urine: 1.022 (ref 1.005–1.030)
Urobilinogen, UA: 1 mg/dL (ref 0.0–1.0)

## 2014-09-08 LAB — URINE MICROSCOPIC-ADD ON

## 2014-09-08 MED ORDER — MORPHINE SULFATE 4 MG/ML IJ SOLN
4.0000 mg | Freq: Once | INTRAMUSCULAR | Status: AC
  Administered 2014-09-08: 4 mg via INTRAVENOUS
  Filled 2014-09-08: qty 1

## 2014-09-08 MED ORDER — SODIUM CHLORIDE 0.9 % IV BOLUS (SEPSIS)
1000.0000 mL | Freq: Once | INTRAVENOUS | Status: AC
  Administered 2014-09-08: 1000 mL via INTRAVENOUS

## 2014-09-08 NOTE — ED Notes (Signed)
Pt is aware urine is needed, pt unable to urinate at this time.  

## 2014-09-08 NOTE — ED Notes (Signed)
Pt returned from US

## 2014-09-08 NOTE — ED Notes (Signed)
Patient here with left flank pain. States prominent history of kidney stones with regular pain from them. States pain began yesterday and initially felt a pop. Described as a "balloon popping". Today pain has been intermittent.

## 2014-09-08 NOTE — ED Notes (Signed)
Pelvic cart placed at bedside.  

## 2014-09-08 NOTE — ED Provider Notes (Signed)
CSN: [id number]     Arrival date & time 09/08/14  1944 History   First MD Initiated Contact with Patient 09/08/14 2156     Chief Complaint  Patient presents with  . Flank Pain  . Pelvic Pain     (Consider location/radiation/quality/duration/timing/severity/associated sxs/prior Treatment) HPI  Pt is a 29yo female with hx of renal stones and ovarian cysts, presenting to ED with c/o gradually worsening left sided flank pain that started yesterday.  Pt states she felt a "pop, like a balloon popping" in the left lower side of her back.  Pain is constant but waxing and waning, sharp and sore. Feels similar to previous kidney stones. States she has had stents in the past but stones typically pass on their own. She has not had to f/u with urology since 2006.  She has tried ibuprofen w/o relief.  Pain radiates into LLQ.  Denies urinary or vaginal symptoms.  Pt was seen in January, found to have a large ovarian cyst on the Right ovary.  Pt is currently trying to conceive and is followed by [facility].  Denies fever, chills, vomiting or diarrhea. Abdominal surgical hx significant for c-section and renal artery stent in 2005. No other significant PMH. Denies concern for STD. Pt had negative GC/Chlamydia swab in Jan.   Past Medical History  Diagnosis Date  . Kidney stones   . Kidney stones    Past Surgical History  Procedure Laterality Date  . Renal artery stent      placed and removed in 2005  . Cesarean section      Nov 2011   Family History  Problem Relation Age of Onset  . Hypertension Mother   . Depression Mother   . Diabetes Father   . Hypertension Father   . Heart disease Father   . Cancer Father    History  Substance Use Topics  . Smoking status: Never Smoker   . Smokeless tobacco: Not on file  . Alcohol Use: 0.0 oz/week    0 Standard drinks or equivalent per week     Comment: occasional   OB History    Gravida Para Term Preterm AB TAB SAB Ectopic Multiple Living   2 1 1  1  1   1      Review of Systems  Constitutional: Positive for appetite change. Negative for fever, chills, diaphoresis and fatigue.  Respiratory: Negative for cough and shortness of breath.   Cardiovascular: Negative for chest pain and palpitations.  Gastrointestinal: Positive for nausea and abdominal pain ( left flank and LLQ). Negative for vomiting, diarrhea and constipation.  Genitourinary: Positive for flank pain ( left) and menstrual problem ( irregular cycle). Negative for dysuria, urgency, frequency, hematuria, vaginal bleeding, vaginal discharge, vaginal pain and pelvic pain.  Musculoskeletal: Positive for back pain ( "left kidney"). Negative for myalgias, neck pain and neck stiffness.  All other systems reviewed and are negative.     Allergies  Erythromycin base; Ciprofloxacin; and Watermelon flavor  Home Medications   Prior to Admission medications   Medication Sig Start Date End Date Taking? Authorizing Provider  acetaminophen (TYLENOL) 500 MG tablet Take 1,000 mg by mouth every 6 (six) hours as needed for moderate pain.   Yes Historical Provider, MD  Aspirin-Acetaminophen-Caffeine (GOODY HEADACHE PO) Take 1 Package by mouth daily as needed (for pain).   Yes Historical Provider, MD  cephALEXin (KEFLEX) 500 MG capsule Take 1 capsule (500 mg total) by mouth 3 (three) times daily. Patient not  taking: Reported on 07/25/2014 06/27/14   Jola Schmidt, MD  HYDROcodone-acetaminophen [facility]) 5-325 MG per tablet Take 1-2 tablets by mouth every 6 (six) hours as needed. 09/09/14   Noland Fordyce, PA-C  metroNIDAZOLE (FLAGYL) 500 MG tablet Take 1 tablet (500 mg total) by mouth 2 (two) times daily. 09/09/14   Noland Fordyce, PA-C  naproxen (NAPROSYN) 500 MG tablet Take 1 tablet (500 mg total) by mouth 2 (two) times daily. Patient not taking: Reported on 08/11/2014 07/25/14   Comer Locket, PA-C  ondansetron (ZOFRAN) 4 MG tablet Take 1 tablet (4 mg total) by mouth every 6 (six) hours. Patient  not taking: Reported on 07/25/2014 06/30/14   Ernestina Patches, MD  phenazopyridine (PYRIDIUM) 200 MG tablet Take 1 tablet (200 mg total) by mouth 3 (three) times daily. Patient not taking: Reported on 07/25/2014 06/27/14   Jola Schmidt, MD   BP 95/58 mmHg  Pulse 77  Temp(Src) 97.8 F (36.6 C) (Oral)  Resp 18  Ht 5' (1.524 m)  Wt 187 lb (84.823 kg)  BMI 36.52 kg/m2  SpO2 98%  LMP 09/03/2014 (Exact Date) Physical Exam  Constitutional: She appears well-developed and well-nourished. No distress.  Pt lying comfortably in exam bed, NAD.  HENT:  Head: Normocephalic and atraumatic.  Eyes: Conjunctivae are normal. No scleral icterus.  Neck: Normal range of motion. Neck supple.  Cardiovascular: Normal rate, regular rhythm and normal heart sounds.   Pulmonary/Chest: Effort normal and breath sounds normal. No respiratory distress. She has no wheezes. She has no rales. She exhibits no tenderness.  Abdominal: Soft. Bowel sounds are normal. She exhibits no distension and no mass. There is tenderness ( left flank and LLQ). There is no rebound, no guarding and no CVA tenderness.  Genitourinary:  Chaperoned exam. Normal external genitalia. Scant amount clear-white vaginal discharge. No vaginal bleeding. Cervical os-closed. No CMT, adnexal tenderness or masses.   Musculoskeletal: Normal range of motion.  Neurological: She is alert.  Skin: Skin is warm and dry. She is not diaphoretic.  Nursing note and vitals reviewed.   ED Course  Procedures (including critical care time) Labs Review Labs Reviewed  WET PREP, GENITAL - Abnormal; Notable for the following:    Clue Cells Wet Prep HPF POC MANY (*)    WBC, Wet Prep HPF POC FEW (*)    All other components within normal limits  URINALYSIS, ROUTINE W REFLEX MICROSCOPIC - Abnormal; Notable for the following:    APPearance CLOUDY (*)    Leukocytes, UA TRACE (*)    All other components within normal limits  URINE MICROSCOPIC-ADD ON - Abnormal; Notable for the  following:    Squamous Epithelial / LPF MANY (*)    Bacteria, UA FEW (*)    All other components within normal limits  PREGNANCY, URINE    Imaging Review US Renal  09/08/2014   CLINICAL DATA:  Acute onset of left flank pain and pelvic pain. Initial encounter.  EXAM: RENAL/URINARY TRACT ULTRASOUND COMPLETE  COMPARISON:  CT of the abdomen and pelvis performed 07/01/2014  FINDINGS: Right Kidney:  Length: 11.2 cm. Echogenicity within normal limits. No mass or hydronephrosis visualized.  Left Kidney:  Length: 11.4 cm. Echogenicity within normal limits. A 5 mm nonobstructing stone is noted at the interpole region of the left kidney. No mass or hydronephrosis visualized.  Bladder:  Appears normal for degree of bladder distention. Bilateral ureteral jets seen.  IMPRESSION: 1. No evidence of hydronephrosis. Bilateral ureteral jets visualized within the bladder, within normal limits. 2.  5 mm nonobstructing stone at the interpole region of the left kidney.   Electronically Signed   By: Garald Balding M.D.   On: 09/08/2014 23:28     EKG Interpretation None      MDM   Final diagnoses:  Left flank pain  Bacterial vaginosis    Pt is a 28yp female with hx of recurrent renal stones as well as a Right ovarian cyst in Jan 2016.  Pt presenting to ED with c/o Left flank pain that started yesterday, waxes and wanes in severity, worse today.  Denies urinary or vaginal symptoms. Pt is tender in left flank. Pelvic exam: unremarkable. Not concerned for ectopic pregnancy, TOA, or ovarian torsion.   Due to known hx of renal stones, will tx as such, and not get repeat CT scan, but will get renal U/S to check for hydronephrosis.  Renal U/S: no evidence of hydronephrosis. Bilateral ureteral jets visualized within bladder, within normal limits. 63mm nonobstructing stone at the interpole region of the left kidney.    Wet prep: many clue cells, will tx for BV.   Discussed results with pt.  Pt agreeable with plan for  flagyl and pain medication. F/u with PCP at [facility] as well as with her OB/GYN.  Return precautions provided. Pt verbalized understanding and agreement with tx plan.   Discussed pt with Dr. Ralene Bathe who reviewed labs and imaging. Agrees with plan to have pt f/u with PCP and OB/GYN for left flank pain.     Noland Fordyce, PA-C 09/09/14 0016  Quintella Reichert, MD 09/09/14 [phone]

## 2014-09-08 NOTE — ED Notes (Signed)
Called lab about missing pregnancy results. Per lab, pregnancy test results should be completed in 3-4 minutes.

## 2014-09-08 NOTE — ED Notes (Signed)
Pt reports having Implanad removed in January, reports ruptured ovarian cyst on R side in Jan-Feb timeline.  Reports irregular periods.

## 2014-09-09 LAB — WET PREP, GENITAL
Trich, Wet Prep: NONE SEEN
Yeast Wet Prep HPF POC: NONE SEEN

## 2014-09-09 LAB — GC/CHLAMYDIA PROBE AMP (~~LOC~~) NOT AT ARMC
Chlamydia: NEGATIVE
Neisseria Gonorrhea: NEGATIVE

## 2014-09-09 LAB — PREGNANCY, URINE: Preg Test, Ur: NEGATIVE

## 2014-09-09 MED ORDER — METRONIDAZOLE 500 MG PO TABS: 500.0000 mg | ORAL_TABLET | Freq: Two times a day (BID) | ORAL | Status: DC

## 2014-09-09 MED ORDER — HYDROCODONE-ACETAMINOPHEN 5-325 MG PO TABS: 1.0000 | ORAL_TABLET | Freq: Four times a day (QID) | ORAL | Status: DC | PRN

## 2014-09-09 NOTE — ED Notes (Signed)
Signature pad in room not working. Pt verbalized understanding of d/c and follow-up care instructions.

## 2014-09-13 ENCOUNTER — Encounter (HOSPITAL_COMMUNITY): Payer: Self-pay | Admitting: Emergency Medicine

## 2014-09-13 ENCOUNTER — Emergency Department (HOSPITAL_COMMUNITY)
Admission: EM | Admit: 2014-09-13 | Discharge: 2014-09-13 | Disposition: A | Discharge status: Home / Self Care | Payer: BLUE CROSS/BLUE SHIELD | Attending: Emergency Medicine | Admitting: Emergency Medicine

## 2014-09-13 DIAGNOSIS — Z87442 Personal history of urinary calculi: Secondary | ICD-10-CM | POA: Insufficient documentation

## 2014-09-13 DIAGNOSIS — Z79899 Other long term (current) drug therapy: Secondary | ICD-10-CM | POA: Insufficient documentation

## 2014-09-13 DIAGNOSIS — M545 Low back pain, unspecified: Secondary | ICD-10-CM

## 2014-09-13 DIAGNOSIS — Z791 Long term (current) use of non-steroidal anti-inflammatories (NSAID): Secondary | ICD-10-CM | POA: Insufficient documentation

## 2014-09-13 DIAGNOSIS — Z3202 Encounter for pregnancy test, result negative: Secondary | ICD-10-CM | POA: Diagnosis not present

## 2014-09-13 DIAGNOSIS — R109 Unspecified abdominal pain: Secondary | ICD-10-CM | POA: Diagnosis present

## 2014-09-13 DIAGNOSIS — Z792 Long term (current) use of antibiotics: Secondary | ICD-10-CM | POA: Insufficient documentation

## 2014-09-13 LAB — URINALYSIS, ROUTINE W REFLEX MICROSCOPIC
Bilirubin Urine: NEGATIVE
Glucose, UA: NEGATIVE mg/dL
Hgb urine dipstick: NEGATIVE
KETONES UR: NEGATIVE mg/dL
Nitrite: NEGATIVE
Protein, ur: NEGATIVE mg/dL
Specific Gravity, Urine: 1.022 (ref 1.005–1.030)
UROBILINOGEN UA: 2 mg/dL — AB (ref 0.0–1.0)
pH: 6.5 (ref 5.0–8.0)

## 2014-09-13 LAB — URINE MICROSCOPIC-ADD ON

## 2014-09-13 LAB — PREGNANCY, URINE: PREG TEST UR: NEGATIVE

## 2014-09-13 MED ORDER — IBUPROFEN 800 MG PO TABS
800.0000 mg | ORAL_TABLET | Freq: Once | ORAL | Status: AC
Start: 2014-09-13 — End: 2014-09-13
  Administered 2014-09-13: 800 mg via ORAL
  Filled 2014-09-13: qty 1

## 2014-09-13 MED ORDER — IBUPROFEN 800 MG PO TABS: 800.0000 mg | ORAL_TABLET | Freq: Three times a day (TID) | ORAL | Status: DC

## 2014-09-13 NOTE — ED Provider Notes (Signed)
CSN: [id number]     Arrival date & time 09/13/14  3419 History   First MD Initiated Contact with Patient 09/13/14 1937     Chief Complaint  Patient presents with  . Flank Pain     (Consider location/radiation/quality/duration/timing/severity/associated sxs/prior Treatment) HPI Patient complains of pain at left flank (points to sacral iliac joint) , nonradiating onset 4 PM today. Pain is worse with movement or changing positions improved with her remaining still. No nausea no vomiting no urinary symptoms no abdominal pain no vaginal discharge. No pelvic pain no fever. No injury. No other associated symptoms. Patient seen here for similar complaint 5 days ago, had renal ultrasound which showed no evidence of hydronephrosis. She was prescribed oxycodone with acetaminophen and metronidazole which she's taken without relief. Past Medical History  Diagnosis Date  . Kidney stones   . Kidney stones    Past Surgical History  Procedure Laterality Date  . Renal artery stent      placed and removed in 2005  . Cesarean section      Nov 2011   Family History  Problem Relation Age of Onset  . Hypertension Mother   . Depression Mother   . Diabetes Father   . Hypertension Father   . Heart disease Father   . Cancer Father    History  Substance Use Topics  . Smoking status: Never Smoker   . Smokeless tobacco: Not on file  . Alcohol Use: 0.0 oz/week    0 Standard drinks or equivalent per week     Comment: occasional   OB History    Gravida Para Term Preterm AB TAB SAB Ectopic Multiple Living   2 1 1  1  1   1      Review of Systems  Constitutional: Negative.   HENT: Negative.   Respiratory: Negative.   Cardiovascular: Negative.   Gastrointestinal: Negative.   Genitourinary: Positive for flank pain.  Musculoskeletal: Negative.   Skin: Negative.   Neurological: Negative.   Psychiatric/Behavioral: Negative.   All other systems reviewed and are negative.     Allergies   Erythromycin base; Ciprofloxacin; and Watermelon flavor  Home Medications   Prior to Admission medications   Medication Sig Start Date End Date Taking? Authorizing Provider  acetaminophen (TYLENOL) 500 MG tablet Take 1,000 mg by mouth every 6 (six) hours as needed for moderate pain.    Historical Provider, MD  Aspirin-Acetaminophen-Caffeine (GOODY HEADACHE PO) Take 1 Package by mouth daily as needed (for pain).    Historical Provider, MD  cephALEXin (KEFLEX) 500 MG capsule Take 1 capsule (500 mg total) by mouth 3 (three) times daily. Patient not taking: Reported on 07/25/2014 06/27/14   Jola Schmidt, MD  HYDROcodone-acetaminophen [facility]) 5-325 MG per tablet Take 1-2 tablets by mouth every 6 (six) hours as needed. 09/09/14   Noland Fordyce, PA-C  metroNIDAZOLE (FLAGYL) 500 MG tablet Take 1 tablet (500 mg total) by mouth 2 (two) times daily. 09/09/14   Noland Fordyce, PA-C  naproxen (NAPROSYN) 500 MG tablet Take 1 tablet (500 mg total) by mouth 2 (two) times daily. Patient not taking: Reported on 08/11/2014 07/25/14   Comer Locket, PA-C  ondansetron (ZOFRAN) 4 MG tablet Take 1 tablet (4 mg total) by mouth every 6 (six) hours. Patient not taking: Reported on 07/25/2014 06/30/14   Ernestina Patches, MD  phenazopyridine (PYRIDIUM) 200 MG tablet Take 1 tablet (200 mg total) by mouth 3 (three) times daily. Patient not taking: Reported on 07/25/2014 06/27/14   Lennette Bihari  Campos, MD   BP 150/92 mmHg  Pulse 91  Temp(Src) 97.7 F (36.5 C) (Oral)  Resp 22  SpO2 100%  LMP 09/03/2014 (Exact Date) Physical Exam  Constitutional: She is oriented to person, place, and time. She appears well-developed and well-nourished.  HENT:  Head: Normocephalic and atraumatic.  Eyes: Conjunctivae are normal. Pupils are equal, round, and reactive to light.  Neck: Neck supple. No tracheal deviation present. No thyromegaly present.  Cardiovascular: Normal rate and regular rhythm.   No murmur heard. Pulmonary/Chest: Effort normal and  breath sounds normal.  Abdominal: Soft. Bowel sounds are normal. She exhibits no distension. There is no tenderness.  Obese  Musculoskeletal: Normal range of motion. She exhibits no edema or tenderness.  Entire spine nontender. Left sided paralumbar tenderness. Pain is exacerbated when she sits up from a supine position. Pelvis stable  Neurological: She is alert and oriented to person, place, and time. No cranial nerve deficit. Coordination normal.  Gait normal  Skin: Skin is warm and dry. No rash noted.  Psychiatric: She has a normal mood and affect.  Nursing note and vitals reviewed.   ED Course  Procedures (including critical care time) Labs Review Labs Reviewed  URINALYSIS, ROUTINE W REFLEX MICROSCOPIC    Imaging Review No results found.   EKG Interpretation None     9:05 PM pain is not improved after treatment with ibuprofen. Results for orders placed or performed during the hospital encounter of 09/13/14  Urinalysis, Routine w reflex microscopic  Result Value Ref Range   Color, Urine YELLOW YELLOW   APPearance CLOUDY (A) CLEAR   Specific Gravity, Urine 1.022 1.005 - 1.030   pH 6.5 5.0 - 8.0   Glucose, UA NEGATIVE NEGATIVE mg/dL   Hgb urine dipstick NEGATIVE NEGATIVE   Bilirubin Urine NEGATIVE NEGATIVE   Ketones, ur NEGATIVE NEGATIVE mg/dL   Protein, ur NEGATIVE NEGATIVE mg/dL   Urobilinogen, UA 2.0 (H) 0.0 - 1.0 mg/dL   Nitrite NEGATIVE NEGATIVE   Leukocytes, UA SMALL (A) NEGATIVE  Pregnancy, urine  Result Value Ref Range   Preg Test, Ur NEGATIVE NEGATIVE  Urine microscopic-add on  Result Value Ref Range   Squamous Epithelial / LPF RARE RARE   WBC, UA 3-6 <3 WBC/hpf   RBC / HPF 0-2 <3 RBC/hpf   Bacteria, UA MANY (A) RARE   Casts GRANULAR CAST (A) NEGATIVE   US Renal  09/08/2014   CLINICAL DATA:  Acute onset of left flank pain and pelvic pain. Initial encounter.  EXAM: RENAL/URINARY TRACT ULTRASOUND COMPLETE  COMPARISON:  CT of the abdomen and pelvis  performed 07/01/2014  FINDINGS: Right Kidney:  Length: 11.2 cm. Echogenicity within normal limits. No mass or hydronephrosis visualized.  Left Kidney:  Length: 11.4 cm. Echogenicity within normal limits. A 5 mm nonobstructing stone is noted at the interpole region of the left kidney. No mass or hydronephrosis visualized.  Bladder:  Appears normal for degree of bladder distention. Bilateral ureteral jets seen.  IMPRESSION: 1. No evidence of hydronephrosis. Bilateral ureteral jets visualized within the bladder, within normal limits. 2. 5 mm nonobstructing stone at the interpole region of the left kidney.   Electronically Signed   By: Garald Balding M.D.   On: 09/08/2014 23:28    MDM  I feel the pain is muscular in etiology. Clinically patient not exhibiting ureteral colic Plan prescription ibuprofen, Referral [place] [facility] Diagnosis low back pain Final diagnoses:  None        Maydelin Deming,  MD 09/13/14 2116

## 2014-09-13 NOTE — ED Notes (Signed)
Bed: OV78 Expected date:  Expected time:  Means of arrival:  Comments: Pt in room

## 2014-09-13 NOTE — ED Notes (Signed)
Pt w/ hx of kidney stones c/o lt flank pain.  Was dx w/ kidney stone on Wednesday.

## 2014-09-13 NOTE — Discharge Instructions (Signed)
Back Exercises Sit in a warm tub 4 times daily for 30 minutes at a time to help with discomfort. It is okay to use Ben-Gay as directed. Take the medication prescribed as needed for discomfort. Call the [facility] in 3 days if not feeling improved or you can go to an urgent care center Back exercises help treat and prevent back injuries. The goal is to increase your strength in your belly (abdominal) and back muscles. These exercises can also help with flexibility. Start these exercises when told by your doctor. HOME CARE Back exercises include: Pelvic Tilt.  Lie on your back with your knees bent. Tilt your pelvis until the lower part of your back is against the floor. Hold this position 5 to 10 sec. Repeat this exercise 5 to 10 times. Knee to Chest.  Pull 1 knee up against your chest and hold for 20 to 30 seconds. Repeat this with the other knee. This may be done with the other leg straight or bent, whichever feels better. Then, pull both knees up against your chest. Sit-Ups or Curl-Ups.  Bend your knees 90 degrees. Start with tilting your pelvis, and do a partial, slow sit-up. Only lift your upper half 30 to 45 degrees off the floor. Take at least 2 to 3 seonds for each sit-up. Do not do sit-ups with your knees out straight. If partial sit-ups are difficult, simply do the above but with only tightening your belly (abdominal) muscles and holding it as told. Hip-Lift.  Lie on your back with your knees flexed 90 degrees. Push down with your feet and shoulders as you raise your hips 2 inches off the floor. Hold for 10 seconds, repeat 5 to 10 times. Back Arches.  Lie on your stomach. Prop yourself up on bent elbows. Slowly press on your hands, causing an arch in your low back. Repeat 3 to 5 times. Shoulder-Lifts.  Lie face down with arms beside your body. Keep hips and belly pressed to floor as you slowly lift your head and shoulders off the floor. Do not overdo your  exercises. Be careful in the beginning. Exercises may cause you some mild back discomfort. If the pain lasts for more than 15 minutes, stop the exercises until you see your doctor. Improvement with exercise for back problems is slow.  Document Released: 07/08/2010 Document Revised: 08/28/2011 Document Reviewed: 04/06/2011 [facility] Patient Information 2015 [place], [place]. This information is not intended to replace advice given to you by your health care provider. Make sure you discuss any questions you have with your health care provider.

## 2014-12-02 ENCOUNTER — Encounter (HOSPITAL_COMMUNITY): Payer: Self-pay | Admitting: *Deleted

## 2014-12-02 ENCOUNTER — Inpatient Hospital Stay (HOSPITAL_COMMUNITY)
Admission: AD | Admit: 2014-12-02 | Discharge: 2014-12-02 | Disposition: A | Discharge status: Home / Self Care | Payer: BLUE CROSS/BLUE SHIELD | Source: Ambulatory Visit | Attending: Obstetrics and Gynecology | Admitting: Obstetrics and Gynecology

## 2014-12-02 DIAGNOSIS — O99611 Diseases of the digestive system complicating pregnancy, first trimester: Secondary | ICD-10-CM | POA: Insufficient documentation

## 2014-12-02 DIAGNOSIS — K210 Gastro-esophageal reflux disease with esophagitis: Secondary | ICD-10-CM | POA: Insufficient documentation

## 2014-12-02 DIAGNOSIS — K219 Gastro-esophageal reflux disease without esophagitis: Secondary | ICD-10-CM | POA: Diagnosis not present

## 2014-12-02 DIAGNOSIS — Z3A01 Less than 8 weeks gestation of pregnancy: Secondary | ICD-10-CM | POA: Insufficient documentation

## 2014-12-02 DIAGNOSIS — O2341 Unspecified infection of urinary tract in pregnancy, first trimester: Secondary | ICD-10-CM | POA: Insufficient documentation

## 2014-12-02 LAB — URINALYSIS, ROUTINE W REFLEX MICROSCOPIC
BILIRUBIN URINE: NEGATIVE
Glucose, UA: NEGATIVE mg/dL
KETONES UR: NEGATIVE mg/dL
Nitrite: POSITIVE — AB
Protein, ur: NEGATIVE mg/dL
Specific Gravity, Urine: >1.03 — ABNORMAL HIGH (ref 1.005–1.030)
UROBILINOGEN UA: 2 mg/dL — AB (ref 0.0–1.0)
pH: 6 (ref 5.0–8.0)

## 2014-12-02 LAB — URINE MICROSCOPIC-ADD ON

## 2014-12-02 LAB — POCT PREGNANCY, URINE: Preg Test, Ur: POSITIVE — AB

## 2014-12-02 MED ORDER — CEPHALEXIN 500 MG PO CAPS: 500.0000 mg | ORAL_CAPSULE | Freq: Four times a day (QID) | ORAL | Status: DC

## 2014-12-02 MED ORDER — FAMOTIDINE 20 MG PO TABS
40.0000 mg | ORAL_TABLET | Freq: Once | ORAL | Status: AC
  Administered 2014-12-02: 40 mg via ORAL
  Filled 2014-12-02: qty 2

## 2014-12-02 MED ORDER — RANITIDINE HCL 150 MG PO TABS: 150.0000 mg | ORAL_TABLET | Freq: Two times a day (BID) | ORAL | Status: DC

## 2014-12-02 NOTE — MAU Note (Signed)
Pt states that she was having sharp bilateral upper abd pain that started today at 2pm. She states that it varies between occurring every few minutes to a few hours. Pain 6/10. Denies VB or LOF. Pt had 3 +UPT at home. She thinks she is about [redacted] weeks pregnant.
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
## 2014-12-02 NOTE — MAU Note (Addendum)
Sharp pain in upper abd comes and goes; started today.  Having constant soreness in lower abd past few days.  +HPT x3

## 2014-12-05 LAB — URINE CULTURE
Culture: >=100000
SPECIAL REQUESTS: NORMAL

## 2014-12-09 ENCOUNTER — Encounter: Payer: Self-pay | Admitting: Family Medicine

## 2014-12-09 ENCOUNTER — Ambulatory Visit (INDEPENDENT_AMBULATORY_CARE_PROVIDER_SITE_OTHER): Payer: Self-pay | Admitting: Family Medicine

## 2014-12-09 VITALS — BP 106/65 | HR 71 | Wt 200.0 lb

## 2014-12-09 DIAGNOSIS — O3421 Maternal care for scar from previous cesarean delivery: Secondary | ICD-10-CM

## 2014-12-09 DIAGNOSIS — Z113 Encounter for screening for infections with a predominantly sexual mode of transmission: Secondary | ICD-10-CM

## 2014-12-09 DIAGNOSIS — O34219 Maternal care for unspecified type scar from previous cesarean delivery: Secondary | ICD-10-CM

## 2014-12-09 DIAGNOSIS — O0991 Supervision of high risk pregnancy, unspecified, first trimester: Secondary | ICD-10-CM

## 2014-12-09 DIAGNOSIS — Z124 Encounter for screening for malignant neoplasm of cervix: Secondary | ICD-10-CM

## 2014-12-09 DIAGNOSIS — O0990 Supervision of high risk pregnancy, unspecified, unspecified trimester: Secondary | ICD-10-CM | POA: Insufficient documentation

## 2014-12-09 DIAGNOSIS — Z3481 Encounter for supervision of other normal pregnancy, first trimester: Secondary | ICD-10-CM

## 2014-12-09 NOTE — Patient Instructions (Signed)
First Trimester of Pregnancy The first trimester of pregnancy is from week 1 until the end of week 12 (months 1 through 3). A week after a sperm fertilizes an egg, the egg will implant on the wall of the uterus. This embryo will begin to develop into a baby. Genes from you and your partner are forming the baby. The female genes determine whether the baby is a boy or a girl. At 6-8 weeks, the eyes and face are formed, and the heartbeat can be seen on ultrasound. At the end of 12 weeks, all the baby's organs are formed.  Now that you are pregnant, you will want to do everything you can to have a healthy baby. Two of the most important things are to get good prenatal care and to follow your health care provider's instructions. Prenatal care is all the medical care you receive before the baby's birth. This care will help prevent, find, and treat any problems during the pregnancy and childbirth. BODY CHANGES Your body goes through many changes during pregnancy. The changes vary from woman to woman.   You may gain or lose a couple of pounds at first.  You may feel sick to your stomach (nauseous) and throw up (vomit). If the vomiting is uncontrollable, call your health care provider.  You may tire easily.  You may develop headaches that can be relieved by medicines approved by your health care provider.  You may urinate more often. Painful urination may mean you have a bladder infection.  You may develop heartburn as a result of your pregnancy.  You may develop constipation because certain hormones are causing the muscles that push waste through your intestines to slow down.  You may develop hemorrhoids or swollen, bulging veins (varicose veins).  Your breasts may begin to grow larger and become tender. Your nipples may stick out more, and the tissue that surrounds them (areola) may become darker.  Your gums may bleed and may be sensitive to brushing and flossing.  Dark spots or blotches  (chloasma, mask of pregnancy) may develop on your face. This will likely fade after the baby is born.  Your menstrual periods will stop.  You may have a loss of appetite.  You may develop cravings for certain kinds of food.  You may have changes in your emotions from day to day, such as being excited to be pregnant or being concerned that something may go wrong with the pregnancy and baby.  You may have more vivid and strange dreams.  You may have changes in your hair. These can include thickening of your hair, rapid growth, and changes in texture. Some women also have hair loss during or after pregnancy, or hair that feels dry or thin. Your hair will most likely return to normal after your baby is born. WHAT TO EXPECT AT YOUR PRENATAL VISITS During a routine prenatal visit:  You will be weighed to make sure you and the baby are growing normally.  Your blood pressure will be taken.  Your abdomen will be measured to track your baby's growth.  The fetal heartbeat will be listened to starting around week 10 or 12 of your pregnancy.  Test results from any previous visits will be discussed. Your health care provider may ask you:  How you are feeling.  If you are feeling the baby move.  If you have had any abnormal symptoms, such as leaking fluid, bleeding, severe headaches, or abdominal cramping.  If you have any questions. Other tests  that may be performed during your first trimester include:  Blood tests to find your blood type and to check for the presence of any previous infections. They will also be used to check for low iron levels (anemia) and Rh antibodies. Later in the pregnancy, blood tests for diabetes will be done along with other tests if problems develop.  Urine tests to check for infections, diabetes, or protein in the urine.  An ultrasound to confirm the proper growth and development of the baby.  An amniocentesis to check for possible genetic problems.  Fetal  screens for spina bifida and Down syndrome.  You may need other tests to make sure you and the baby are doing well. HOME CARE INSTRUCTIONS  Medicines  Follow your health care provider's instructions regarding medicine use. Specific medicines may be either safe or unsafe to take during pregnancy.  Take your prenatal vitamins as directed.  If you develop constipation, try taking a stool softener if your health care provider approves. Diet  Eat regular, well-balanced meals. Choose a variety of foods, such as meat or vegetable-based protein, fish, milk and low-fat dairy products, vegetables, fruits, and whole grain breads and cereals. Your health care provider will help you determine the amount of weight gain that is right for you.  Avoid raw meat and uncooked cheese. These carry germs that can cause birth defects in the baby.  Eating four or five small meals rather than three large meals a day may help relieve nausea and vomiting. If you start to feel nauseous, eating a few soda crackers can be helpful. Drinking liquids between meals instead of during meals also seems to help nausea and vomiting.  If you develop constipation, eat more high-fiber foods, such as fresh vegetables or fruit and whole grains. Drink enough fluids to keep your urine clear or pale yellow. Activity and Exercise  Exercise only as directed by your health care provider. Exercising will help you:  Control your weight.  Stay in shape.  Be prepared for labor and delivery.  Experiencing pain or cramping in the lower abdomen or low back is a good sign that you should stop exercising. Check with your health care provider before continuing normal exercises.  Try to avoid standing for long periods of time. Move your legs often if you must stand in one place for a long time.  Avoid heavy lifting.  Wear low-heeled shoes, and practice good posture.  You may continue to have sex unless your health care provider directs you  otherwise. Relief of Pain or Discomfort  Wear a good support bra for breast tenderness.   Take warm sitz baths to soothe any pain or discomfort caused by hemorrhoids. Use hemorrhoid cream if your health care provider approves.   Rest with your legs elevated if you have leg cramps or low back pain.  If you develop varicose veins in your legs, wear support hose. Elevate your feet for 15 minutes, 3-4 times a day. Limit salt in your diet. Prenatal Care  Schedule your prenatal visits by the twelfth week of pregnancy. They are usually scheduled monthly at first, then more often in the last 2 months before delivery.  Write down your questions. Take them to your prenatal visits.  Keep all your prenatal visits as directed by your health care provider. Safety  Wear your seat belt at all times when driving.  Make a list of emergency phone numbers, including numbers for family, friends, the hospital, and police and fire departments. General Tips  Ask your health care provider for a referral to a local prenatal education class. Begin classes no later than at the beginning of month 6 of your pregnancy.  Ask for help if you have counseling or nutritional needs during pregnancy. Your health care provider can offer advice or refer you to specialists for help with various needs.  Do not use hot tubs, steam rooms, or saunas.  Do not douche or use tampons or scented sanitary pads.  Do not cross your legs for long periods of time.  Avoid cat litter boxes and soil used by cats. These carry germs that can cause birth defects in the baby and possibly loss of the fetus by miscarriage or stillbirth.  Avoid all smoking, herbs, alcohol, and medicines not prescribed by your health care provider. Chemicals in these affect the formation and growth of the baby.  Schedule a dentist appointment. At home, brush your teeth with a soft toothbrush and be gentle when you floss. SEEK MEDICAL CARE IF:   You have  dizziness.  You have mild pelvic cramps, pelvic pressure, or nagging pain in the abdominal area.  You have persistent nausea, vomiting, or diarrhea.  You have a bad smelling vaginal discharge.  You have pain with urination.  You notice increased swelling in your face, hands, legs, or ankles. SEEK IMMEDIATE MEDICAL CARE IF:   You have a fever.  You are leaking fluid from your vagina.  You have spotting or bleeding from your vagina.  You have severe abdominal cramping or pain.  You have rapid weight gain or loss.  You vomit blood or material that looks like coffee grounds.  You are exposed to [place] measles and have never had them.  You are exposed to fifth disease or chickenpox.  You develop a severe headache.  You have shortness of breath.  You have any kind of trauma, such as from a fall or a car accident. Document Released: 05/30/2001 Document Revised: 10/20/2013 Document Reviewed: 04/15/2013 [facility] Patient Information 2015 [place], [place]. This information is not intended to replace advice given to you by your health care provider. Make sure you discuss any questions you have with your health care provider.  Breastfeeding Deciding to breastfeed is one of the [place] choices you can make for you and your baby. A change in hormones during pregnancy causes your breast tissue to grow and increases the number and size of your milk ducts. These hormones also allow proteins, sugars, and fats from your blood supply to make breast milk in your milk-producing glands. Hormones prevent breast milk from being released before your baby is born as well as prompt milk flow after birth. Once breastfeeding has begun, thoughts of your baby, as well as his or her sucking or crying, can stimulate the release of milk from your milk-producing glands.  BENEFITS OF BREASTFEEDING For Your Baby  Your first milk (colostrum) helps your baby's digestive system function better.   There are antibodies  in your milk that help your baby fight off infections.   Your baby has a lower incidence of asthma, allergies, and sudden infant death syndrome.   The nutrients in breast milk are better for your baby than infant formulas and are designed uniquely for your baby's needs.   Breast milk improves your baby's brain development.   Your baby is less likely to develop other conditions, such as childhood obesity, asthma, or type 2 diabetes mellitus.  For You   Breastfeeding helps to create a very special bond between  you and your baby.   Breastfeeding is convenient. Breast milk is always available at the correct temperature and costs nothing.   Breastfeeding helps to burn calories and helps you lose the weight gained during pregnancy.   Breastfeeding makes your uterus contract to its prepregnancy size faster and slows bleeding (lochia) after you give birth.   Breastfeeding helps to lower your risk of developing type 2 diabetes mellitus, osteoporosis, and breast or ovarian cancer later in life. SIGNS THAT YOUR BABY IS HUNGRY Early Signs of Hunger  Increased alertness or activity.  Stretching.  Movement of the head from side to side.  Movement of the head and opening of the mouth when the corner of the mouth or cheek is stroked (rooting).  Increased sucking sounds, smacking lips, cooing, sighing, or squeaking.  Hand-to-mouth movements.  Increased sucking of fingers or hands. Late Signs of Hunger  Fussing.  Intermittent crying. Extreme Signs of Hunger Signs of extreme hunger will require calming and consoling before your baby will be able to breastfeed successfully. Do not wait for the following signs of extreme hunger to occur before you initiate breastfeeding:   Restlessness.  A loud, strong cry.   Screaming. BREASTFEEDING BASICS Breastfeeding Initiation  Find a comfortable place to sit or lie down, with your neck and back well supported.  Place a pillow or  rolled up blanket under your baby to bring him or her to the level of your breast (if you are seated). Nursing pillows are specially designed to help support your arms and your baby while you breastfeed.  Make sure that your baby's abdomen is facing your abdomen.   Gently massage your breast. With your fingertips, massage from your chest wall toward your nipple in a circular motion. This encourages milk flow. You may need to continue this action during the feeding if your milk flows slowly.  Support your breast with 4 fingers underneath and your thumb above your nipple. Make sure your fingers are well away from your nipple and your baby's mouth.   Stroke your baby's lips gently with your finger or nipple.   When your baby's mouth is open wide enough, quickly bring your baby to your breast, placing your entire nipple and as much of the colored area around your nipple (areola) as possible into your baby's mouth.   More areola should be visible above your baby's upper lip than below the lower lip.   Your baby's tongue should be between his or her lower gum and your breast.   Ensure that your baby's mouth is correctly positioned around your nipple (latched). Your baby's lips should create a seal on your breast and be turned out (everted).  It is common for your baby to suck about 2-3 minutes in order to start the flow of breast milk. Latching Teaching your baby how to latch on to your breast properly is very important. An improper latch can cause nipple pain and decreased milk supply for you and poor weight gain in your baby. Also, if your baby is not latched onto your nipple properly, he or she may swallow some air during feeding. This can make your baby fussy. Burping your baby when you switch breasts during the feeding can help to get rid of the air. However, teaching your baby to latch on properly is still the [place] way to prevent fussiness from swallowing air while breastfeeding. Signs  that your baby has successfully latched on to your nipple:    Silent tugging or silent  sucking, without causing you pain.   Swallowing heard between every 3-4 sucks.    Muscle movement above and in front of his or her ears while sucking.  Signs that your baby has not successfully latched on to nipple:   Sucking sounds or smacking sounds from your baby while breastfeeding.  Nipple pain. If you think your baby has not latched on correctly, slip your finger into the corner of your baby's mouth to break the suction and place it between your baby's gums. Attempt breastfeeding initiation again. Signs of Successful Breastfeeding Signs from your baby:   A gradual decrease in the number of sucks or complete cessation of sucking.   Falling asleep.   Relaxation of his or her body.   Retention of a small amount of milk in his or her mouth.   Letting go of your breast by himself or herself. Signs from you:  Breasts that have increased in firmness, weight, and size 1-3 hours after feeding.   Breasts that are softer immediately after breastfeeding.  Increased milk volume, as well as a change in milk consistency and color by the fifth day of breastfeeding.   Nipples that are not sore, cracked, or bleeding. Signs That Your [place]el Books is Getting Enough Milk  Wetting at least 3 diapers in a 24-hour period. The urine should be clear and pale yellow by age 69 days.  At least 3 stools in a 24-hour period by age 69 days. The stool should be soft and yellow.  At least 3 stools in a 24-hour period by age 34 days. The stool should be seedy and yellow.  No loss of weight greater than 10% of birth weight during the first 82 days of age.  Average weight gain of 4-7 ounces (113-198 g) per week after age 55 days.  Consistent daily weight gain by age 81 days, without weight loss after the age of 2 weeks. After a feeding, your baby may spit up a small amount. This is common. BREASTFEEDING FREQUENCY AND  DURATION Frequent feeding will help you make more milk and can prevent sore nipples and breast engorgement. Breastfeed when you feel the need to reduce the fullness of your breasts or when your baby shows signs of hunger. This is called "breastfeeding on demand." Avoid introducing a pacifier to your baby while you are working to establish breastfeeding (the first 4-6 weeks after your baby is born). After this time you may choose to use a pacifier. Research has shown that pacifier use during the first year of a baby's life decreases the risk of sudden infant death syndrome (SIDS). Allow your baby to feed on each breast as long as he or she wants. Breastfeed until your baby is finished feeding. When your baby unlatches or falls asleep while feeding from the first breast, offer the second breast. Because newborns are often sleepy in the first few weeks of life, you may need to awaken your baby to get him or her to feed. Breastfeeding times will vary from baby to baby. However, the following rules can serve as a guide to help you ensure that your baby is properly fed:  Newborns (babies 1 weeks of age or younger) may breastfeed every 1-3 hours.  Newborns should not go longer than 3 hours during the day or 5 hours during the night without breastfeeding.  You should breastfeed your baby a minimum of 8 times in a 24-hour period until you begin to introduce solid foods to your baby at around 6  months of age. BREAST MILK PUMPING Pumping and storing breast milk allows you to ensure that your baby is exclusively fed your breast milk, even at times when you are unable to breastfeed. This is especially important if you are going back to work while you are still breastfeeding or when you are not able to be present during feedings. Your lactation consultant can give you guidelines on how long it is safe to store breast milk.  A breast pump is a machine that allows you to pump milk from your breast into a sterile bottle.  The pumped breast milk can then be stored in a refrigerator or freezer. Some breast pumps are operated by hand, while others use electricity. Ask your lactation consultant which type will work [place] for you. Breast pumps can be purchased, but some hospitals and breastfeeding support groups lease breast pumps on a monthly basis. A lactation consultant can teach you how to hand express breast milk, if you prefer not to use a pump.  CARING FOR YOUR BREASTS WHILE YOU BREASTFEED Nipples can become dry, cracked, and sore while breastfeeding. The following recommendations can help keep your breasts moisturized and healthy:  Avoid using soap on your nipples.   Wear a supportive bra. Although not required, special nursing bras and tank tops are designed to allow access to your breasts for breastfeeding without taking off your entire bra or top. Avoid wearing underwire-style bras or extremely tight bras.  Air dry your nipples for 3-31mnutes after each feeding.   Use only cotton bra pads to absorb leaked breast milk. Leaking of breast milk between feedings is normal.   Use lanolin on your nipples after breastfeeding. Lanolin helps to maintain your skin's normal moisture barrier. If you use pure lanolin, you do not need to wash it off before feeding your baby again. Pure lanolin is not toxic to your baby. You may also hand express a few drops of breast milk and gently massage that milk into your nipples and allow the milk to air dry. In the first few weeks after giving birth, some women experience extremely full breasts (engorgement). Engorgement can make your breasts feel heavy, warm, and tender to the touch. Engorgement peaks within 3-5 days after you give birth. The following recommendations can help ease engorgement:  Completely empty your breasts while breastfeeding or pumping. You may want to start by applying warm, moist heat (in the shower or with warm water-soaked hand towels) just before feeding or  pumping. This increases circulation and helps the milk flow. If your baby does not completely empty your breasts while breastfeeding, pump any extra milk after he or she is finished.  Wear a snug bra (nursing or regular) or tank top for 1-2 days to signal your body to slightly decrease milk production.  Apply ice packs to your breasts, unless this is too uncomfortable for you.  Make sure that your baby is latched on and positioned properly while breastfeeding. If engorgement persists after 48 hours of following these recommendations, contact your health care provider or a lScience writer OVERALL HEALTH CARE RECOMMENDATIONS WHILE BREASTFEEDING  Eat healthy foods. Alternate between meals and snacks, eating 3 of each per day. Because what you eat affects your breast milk, some of the foods may make your baby more irritable than usual. Avoid eating these foods if you are sure that they are negatively affecting your baby.  Drink milk, fruit juice, and water to satisfy your thirst (about 10 glasses a day).   Rest  often, relax, and continue to take your prenatal vitamins to prevent fatigue, stress, and anemia.  Continue breast self-awareness checks.  Avoid chewing and smoking tobacco.  Avoid alcohol and drug use. Some medicines that may be harmful to your baby can pass through breast milk. It is important to ask your health care provider before taking any medicine, including all over-the-counter and prescription medicine as well as vitamin and herbal supplements. It is possible to become pregnant while breastfeeding. If birth control is desired, ask your health care provider about options that will be safe for your baby. SEEK MEDICAL CARE IF:   You feel like you want to stop breastfeeding or have become frustrated with breastfeeding.  You have painful breasts or nipples.  Your nipples are cracked or bleeding.  Your breasts are red, tender, or warm.  You have a swollen area on either  breast.  You have a fever or chills.  You have nausea or vomiting.  You have drainage other than breast milk from your nipples.  Your breasts do not become full before feedings by the fifth day after you give birth.  You feel sad and depressed.  Your baby is too sleepy to eat well.  Your baby is having trouble sleeping.   Your baby is wetting less than 3 diapers in a 24-hour period.  Your baby has less than 3 stools in a 24-hour period.  Your baby's skin or the white part of his or her eyes becomes yellow.   Your baby is not gaining weight by 26 days of age. SEEK IMMEDIATE MEDICAL CARE IF:   Your baby is overly tired (lethargic) and does not want to wake up and feed.  Your baby develops an unexplained fever. Document Released: 06/05/2005 Document Revised: 06/10/2013 Document Reviewed: 11/27/2012 [facility] Patient Information 2015 [place], [place]. This information is not intended to replace advice given to you by your health care provider. Make sure you discuss any questions you have with your health care provider.

## 2014-12-09 NOTE — Progress Notes (Signed)
   Subjective:    [name] is a G3P1011 [redacted]w[redacted]d being seen today for her first obstetrical visit.  Her obstetrical history is significant for previous Cesarean section. Patient does intend to breast feed. Pregnancy history fully reviewed.  Patient reports cough and reflux.  Filed Vitals:   12/09/14 0951  BP: 106/65  Pulse: 71  Weight: 200 lb (90.719 kg)    HISTORY: OB History  Gravida Para Term Preterm AB SAB TAB Ectopic Multiple Living  3 1 1  1 1    1     # Outcome Date GA Lbr Len/2nd Weight Sex Delivery Anes PTL Lv  3 Current           2 Term 05/09/10 [redacted]w[redacted]d    CS-LTranv   Y     Complications: Fetal Intolerance  1 SAB              Past Medical History  Diagnosis Date  . Kidney stones   . Kidney stones    Past Surgical History  Procedure Laterality Date  . Renal artery stent      placed and removed in 2005  . Cesarean section      Nov 2011   Family History  Problem Relation Age of Onset  . Hypertension Mother   . Depression Mother   . Diabetes Father   . Hypertension Father   . Heart disease Father   . Cancer Father     foot     Exam    Uterus:   6 wk  Pelvic Exam:    Perineum: Normal Perineum   Vulva: Bartholin's, Urethra, Skene's normal   Vagina:  normal mucosa, normal discharge   Cervix: no cervical motion tenderness and no lesions   Adnexa: normal adnexa   Bony Pelvis: average  System: Breast:  normal appearance, no masses or tenderness   Skin: normal coloration and turgor, no rashes    Neurologic: oriented   Extremities: normal strength, tone, and muscle mass   HEENT sclera clear, anicteric   Mouth/Teeth mucous membranes moist, pharynx normal without lesions   Neck supple   Cardiovascular: regular rate and rhythm, no murmurs or gallops   Respiratory:  appears well, vitals normal, no respiratory distress, acyanotic, normal RR, ear and throat exam is normal, neck free of mass or lymphadenopathy, chest clear, no wheezing, crepitations,  rhonchi, normal symmetric air entry   Abdomen: soft, non-tender; bowel sounds normal; no masses,  no organomegaly      Assessment:    Pregnancy: [id number] Patient Active Problem List   Diagnosis Date Noted  . Supervision of high risk pregnancy, antepartum 12/09/2014    Priority: High  . Previous cesarean delivery affecting pregnancy, antepartum 12/09/2014    Priority: Medium        Plan:     Initial labs drawn. Prenatal vitamins. Problem list reviewed and updated. Genetic Screening discussed First Screen: ordered.  Ultrasound discussed; fetal survey: discussed.  Follow up in 4 weeks.    PRATT,TANYA S 12/09/2014

## 2014-12-09 NOTE — Progress Notes (Signed)
Bedside ultrasound shows [redacted]w[redacted]d fetus with heartbeat.

## 2014-12-10 LAB — PRENATAL PROFILE (SOLSTAS)
Antibody Screen: NEGATIVE
Basophils Absolute: 0 10*3/uL (ref 0.0–0.1)
Basophils Relative: 0 % (ref 0–1)
Eosinophils Absolute: 0.1 10*3/uL (ref 0.0–0.7)
Eosinophils Relative: 1 % (ref 0–5)
HCT: 34 % — ABNORMAL LOW (ref 36.0–46.0)
HEP B S AG: NEGATIVE
HIV 1&2 Ab, 4th Generation: NONREACTIVE
Hemoglobin: 11 g/dL — ABNORMAL LOW (ref 12.0–15.0)
Lymphocytes Relative: 20 % (ref 12–46)
Lymphs Abs: 1.5 10*3/uL (ref 0.7–4.0)
MCH: 27 pg (ref 26.0–34.0)
MCHC: 32.4 g/dL (ref 30.0–36.0)
MCV: 83.5 fL (ref 78.0–100.0)
MPV: 8.9 fL (ref 8.6–12.4)
Monocytes Absolute: 0.4 10*3/uL (ref 0.1–1.0)
Monocytes Relative: 5 % (ref 3–12)
NEUTROS ABS: 5.7 10*3/uL (ref 1.7–7.7)
NEUTROS PCT: 74 % (ref 43–77)
PLATELETS: 266 10*3/uL (ref 150–400)
RBC: 4.07 MIL/uL (ref 3.87–5.11)
RDW: 16.1 % — AB (ref 11.5–15.5)
Rh Type: POSITIVE
Rubella: 7.02 Index — ABNORMAL HIGH (ref <0.90)
WBC: 7.7 10*3/uL (ref 4.0–10.5)

## 2014-12-10 LAB — CYTOLOGY - PAP

## 2014-12-31 ENCOUNTER — Encounter: Payer: BLUE CROSS/BLUE SHIELD | Admitting: Obstetrics & Gynecology

## 2015-01-07 ENCOUNTER — Ambulatory Visit (INDEPENDENT_AMBULATORY_CARE_PROVIDER_SITE_OTHER): Payer: Self-pay | Admitting: Obstetrics & Gynecology

## 2015-01-07 VITALS — BP 119/78 | HR 79 | Wt 200.0 lb

## 2015-01-07 DIAGNOSIS — Z3491 Encounter for supervision of normal pregnancy, unspecified, first trimester: Secondary | ICD-10-CM

## 2015-01-07 DIAGNOSIS — O3421 Maternal care for scar from previous cesarean delivery: Secondary | ICD-10-CM

## 2015-01-07 DIAGNOSIS — O34219 Maternal care for unspecified type scar from previous cesarean delivery: Secondary | ICD-10-CM

## 2015-01-07 NOTE — Patient Instructions (Signed)
Return to clinic for any obstetric concerns or go to MAU for evaluation  

## 2015-01-07 NOTE — Progress Notes (Signed)
Subjective:  [name] is a 29 y.o. G3P1011 at [redacted]w[redacted]d being seen today for ongoing prenatal care.  Patient reports no complaints.  Contractions: Not present.  Vag. Bleeding: None. Movement: Absent. Denies leaking of fluid.   The following portions of the patient's history were reviewed and updated as appropriate: allergies, current medications, past family history, past medical history, past social history, past surgical history and problem list.   Objective:   Filed Vitals:   01/07/15 0949  BP: 119/78  Pulse: 79  Weight: 200 lb (90.719 kg)    Fetal Status: Fetal Heart Rate (bpm): 175   Movement: Absent     General:  Alert, oriented and cooperative. Patient is in no acute distress.  Skin: Skin is warm and dry. No rash noted.   Cardiovascular: Normal heart rate noted  Respiratory: Normal respiratory effort, no problems with respiration noted  Abdomen: Soft, gravid, appropriate for gestational age. Pain/Pressure: Absent     Vaginal: Vag. Bleeding: None.    Vag D/C Character: Thin  Cervix: Not evaluated        Extremities: Normal range of motion.     Mental Status: Normal mood and affect. Normal behavior. Normal judgment and thought content.   Urinalysis: Urine Protein: Trace Urine Glucose: Negative  Assessment and Plan:  Pregnancy: G3P1011 at [redacted]w[redacted]d 1. Previous cesarean delivery affecting pregnancy, antepartum 2. Supervision of normal pregnancy, first trimester - US OB Detail + 27 Wk; Future Patient is eligible for Babyscripts program. No other complaints or concerns.  Routine obstetric precautions reviewed. Please refer to After Visit Summary for other counseling recommendations.  Return in about 4 weeks (around 02/04/2015) for OB visit.   Osborne Oman, MD

## 2015-01-07 NOTE — Assessment & Plan Note (Signed)
BABYSCRIPTS PATIENT: [name] ] initial, [name] ] 12, [ ]  20, [ ]  28, [ ]  32, [ ]  36, [ ]  38, [ ]  39, [ ]  40

## 2015-01-20 ENCOUNTER — Ambulatory Visit (INDEPENDENT_AMBULATORY_CARE_PROVIDER_SITE_OTHER): Payer: Self-pay | Admitting: Obstetrics & Gynecology

## 2015-01-20 VITALS — BP 107/75 | HR 83 | Wt 198.0 lb

## 2015-01-20 DIAGNOSIS — Z3491 Encounter for supervision of normal pregnancy, unspecified, first trimester: Secondary | ICD-10-CM

## 2015-01-20 DIAGNOSIS — Z3481 Encounter for supervision of other normal pregnancy, first trimester: Secondary | ICD-10-CM

## 2015-01-20 NOTE — Progress Notes (Signed)
Subjective:  [name] is a 29 y.o. G3P1011 at [redacted]w[redacted]d being seen today for ongoing prenatal care. She is in the Babyscripts program.    Patient reports nausea and some back pain which she feels is normal and does not want any medication.  Contractions: Not present.  Vag. Bleeding: None. Movement: Absent. Denies leaking of fluid.   The following portions of the patient's history were reviewed and updated as appropriate: allergies, current medications, past family history, past medical history, past social history, past surgical history and problem list.   Objective:   Filed Vitals:   01/20/15 0952  BP: 107/75  Pulse: 83  Weight: 198 lb (89.812 kg)    Fetal Status: Fetal Heart Rate (bpm): 153   Movement: Absent     General:  Alert, oriented and cooperative. Patient is in no acute distress.  Skin: Skin is warm and dry. No rash noted.   Cardiovascular: Normal heart rate noted  Respiratory: Normal respiratory effort, no problems with respiration noted  Abdomen: Soft, gravid, appropriate for gestational age. Pain/Pressure: Present     Vaginal: Vag. Bleeding: None.    Vag D/C Character: Thin  Cervix: Not evaluated        Extremities: Normal range of motion.  Edema: Trace  Mental Status: Normal mood and affect. Normal behavior. Normal judgment and thought content.   Urinalysis: Urine Protein: Negative Urine Glucose: Negative  Assessment and Plan:  Pregnancy: G3P1011 at [redacted]w[redacted]d  Supervision of normal pregnancy, first trimester NT scan on 01/21/15; Anatomy scan already scheduled 03/04/15.  Will follow up results and manage accordingly. AFP only draw next visit. Continue BabyScripts program Routine obstetric precautions reviewed. Please refer to After Visit Summary for other counseling recommendations.  Return in about 8 weeks (around 03/17/2015) for OB visit [facility] program) and AFP only lab draw.   Osborne Oman, MD

## 2015-01-20 NOTE — Patient Instructions (Signed)
Return to clinic for any obstetric concerns or go to MAU for evaluation  

## 2015-01-20 NOTE — Progress Notes (Signed)
Pt c/o back spasms occasionally and periodic N/V, does not prefer to have any anti-nausea medication called in at this time.

## 2015-01-21 ENCOUNTER — Ambulatory Visit (HOSPITAL_COMMUNITY)
Admission: RE | Admit: 2015-01-21 | Discharge: 2015-01-21 | Disposition: A | Discharge status: Home / Self Care | Payer: Self-pay | Source: Ambulatory Visit | Attending: Family Medicine | Admitting: Family Medicine

## 2015-01-21 ENCOUNTER — Encounter (HOSPITAL_COMMUNITY): Payer: Self-pay

## 2015-01-21 VITALS — BP 113/77 | HR 79 | Wt 200.8 lb

## 2015-01-21 DIAGNOSIS — Z3491 Encounter for supervision of normal pregnancy, unspecified, first trimester: Secondary | ICD-10-CM

## 2015-01-21 DIAGNOSIS — O0991 Supervision of high risk pregnancy, unspecified, first trimester: Secondary | ICD-10-CM | POA: Insufficient documentation

## 2015-01-25 ENCOUNTER — Inpatient Hospital Stay (HOSPITAL_COMMUNITY)
Admission: AD | Admit: 2015-01-25 | Discharge: 2015-01-25 | Disposition: A | Discharge status: Home / Self Care | Payer: Self-pay | Source: Ambulatory Visit | Attending: Family Medicine | Admitting: Family Medicine

## 2015-01-25 ENCOUNTER — Encounter (HOSPITAL_COMMUNITY): Payer: Self-pay

## 2015-01-25 DIAGNOSIS — Z3492 Encounter for supervision of normal pregnancy, unspecified, second trimester: Secondary | ICD-10-CM

## 2015-01-25 DIAGNOSIS — Z3A13 13 weeks gestation of pregnancy: Secondary | ICD-10-CM

## 2015-01-25 DIAGNOSIS — O2342 Unspecified infection of urinary tract in pregnancy, second trimester: Secondary | ICD-10-CM

## 2015-01-25 DIAGNOSIS — Z87442 Personal history of urinary calculi: Secondary | ICD-10-CM | POA: Insufficient documentation

## 2015-01-25 DIAGNOSIS — O3421 Maternal care for scar from previous cesarean delivery: Secondary | ICD-10-CM | POA: Insufficient documentation

## 2015-01-25 DIAGNOSIS — Z87891 Personal history of nicotine dependence: Secondary | ICD-10-CM | POA: Insufficient documentation

## 2015-01-25 DIAGNOSIS — N390 Urinary tract infection, site not specified: Secondary | ICD-10-CM

## 2015-01-25 DIAGNOSIS — Z886 Allergy status to analgesic agent status: Secondary | ICD-10-CM | POA: Insufficient documentation

## 2015-01-25 DIAGNOSIS — Z3A00 Weeks of gestation of pregnancy not specified: Secondary | ICD-10-CM | POA: Insufficient documentation

## 2015-01-25 DIAGNOSIS — Z881 Allergy status to other antibiotic agents status: Secondary | ICD-10-CM | POA: Insufficient documentation

## 2015-01-25 DIAGNOSIS — O34219 Maternal care for unspecified type scar from previous cesarean delivery: Secondary | ICD-10-CM

## 2015-01-25 DIAGNOSIS — O2340 Unspecified infection of urinary tract in pregnancy, unspecified trimester: Secondary | ICD-10-CM | POA: Insufficient documentation

## 2015-01-25 HISTORY — DX: Leiomyoma of uterus, unspecified: D25.9

## 2015-01-25 LAB — URINALYSIS, ROUTINE W REFLEX MICROSCOPIC
BILIRUBIN URINE: NEGATIVE
Glucose, UA: NEGATIVE mg/dL
KETONES UR: 15 mg/dL — AB
NITRITE: POSITIVE — AB
Protein, ur: 30 mg/dL — AB
SPECIFIC GRAVITY, URINE: 1.025 (ref 1.005–1.030)
UROBILINOGEN UA: 1 mg/dL (ref 0.0–1.0)
pH: 6 (ref 5.0–8.0)

## 2015-01-25 LAB — URINE MICROSCOPIC-ADD ON

## 2015-01-25 MED ORDER — CEPHALEXIN 500 MG PO CAPS: 500.0000 mg | ORAL_CAPSULE | Freq: Three times a day (TID) | ORAL | Status: DC

## 2015-01-25 NOTE — MAU Note (Signed)
Pt states she has had spotting x 48 hours, initially it was after sex but has continued off/on. Had an u/s on Thursday.

## 2015-01-25 NOTE — Discharge Instructions (Signed)

## 2015-01-25 NOTE — MAU Provider Note (Signed)
History     CSN: [id number]  Arrival date and time: 01/25/15 1933   First Provider Initiated Contact with Patient 01/25/15 2044      No chief complaint on file.  Vaginal Bleeding The patient's primary symptoms include vaginal bleeding. This is a new problem. The current episode started yesterday. The problem occurs intermittently. The problem has been unchanged. The patient is experiencing no pain. She is pregnant. Associated symptoms include nausea. Pertinent negatives include no abdominal pain, constipation, diarrhea, dysuria, fever, frequency, urgency or vomiting. The vaginal discharge was bloody. The vaginal bleeding is spotting. She has not been passing clots. She has not been passing tissue. The symptoms are aggravated by intercourse. She has tried nothing for the symptoms. She is sexually active. No, her partner does not have an STD.    Past Medical History  Diagnosis Date  . Kidney stones   . Kidney stones   . Kidney stones   . Uterine fibroid     Past Surgical History  Procedure Laterality Date  . Renal artery stent      placed and removed in 2005  . Cesarean section      Nov 2011    Family History  Problem Relation Age of Onset  . Hypertension Mother   . Depression Mother   . Diabetes Father   . Hypertension Father   . Heart disease Father   . Cancer Father     foot    History  Substance Use Topics  . Smoking status: Former Research scientist (life sciences)  . Smokeless tobacco: Not on file  . Alcohol Use: No    Allergies:  Allergies  Allergen Reactions  . Erythromycin Base Anaphylaxis  . Ciprofloxacin Nausea And Vomiting  . Watermelon Flavor Other (See Comments)    REACTION: MIGRAINES   . Latex Other (See Comments)    Causes irritation.  . Erythromycin Hives and Rash    Prescriptions prior to admission  Medication Sig Dispense Refill Last Dose  . acetaminophen (TYLENOL) 500 MG tablet Take 1,000 mg by mouth every 6 (six) hours as needed for moderate pain or headache.     Past Month at Unknown time  . diphenhydramine-acetaminophen (TYLENOL PM) 25-500 MG TABS Take 2 tablets by mouth at bedtime as needed (sleep).   01/24/2015 at Unknown time  . ranitidine (ZANTAC) 150 MG tablet Take 1 tablet (150 mg total) by mouth 2 (two) times daily. (Patient not taking: Reported on 01/20/2015) 60 tablet 0 Not Taking at Unknown time    Review of Systems  Constitutional: Negative for fever.  Gastrointestinal: Positive for nausea. Negative for vomiting, abdominal pain, diarrhea and constipation.  Genitourinary: Positive for vaginal bleeding. Negative for dysuria, urgency and frequency.   Physical Exam   Blood pressure 132/73, pulse 93, temperature 98.2 F (36.8 C), resp. rate 18, height 5' 1.5" (1.562 m), weight 90.266 kg (199 lb), last menstrual period 10/26/2014, SpO2 100 %.  Physical Exam  Nursing note and vitals reviewed. Constitutional: She is oriented to person, place, and time. She appears well-developed and well-nourished. No distress.  HENT:  Head: Normocephalic.  Cardiovascular: Normal rate.   Respiratory: Effort normal.  GI: Soft. There is no tenderness. There is no rebound.  Genitourinary:   External: no lesion Vagina: small amount of white discharge Cervix: pink, smooth, no CMT Uterus: AGA, FHT 165 Adnexa: NT   Neurological: She is alert and oriented to person, place, and time.  Skin: Skin is warm and dry.  Psychiatric: She has a normal mood  and affect.   Results for orders placed or performed during the hospital encounter of 01/25/15 (from the past 24 hour(s))  Urinalysis, Routine w reflex microscopic (not at [facility])     Status: Abnormal   Collection Time: 01/25/15  7:57 PM  Result Value Ref Range   Color, Urine YELLOW YELLOW   APPearance TURBID (A) CLEAR   Specific Gravity, Urine 1.025 1.005 - 1.030   pH 6.0 5.0 - 8.0   Glucose, UA NEGATIVE NEGATIVE mg/dL   Hgb urine dipstick LARGE (A) NEGATIVE   Bilirubin Urine NEGATIVE NEGATIVE   Ketones, ur 15  (A) NEGATIVE mg/dL   Protein, ur 30 (A) NEGATIVE mg/dL   Urobilinogen, UA 1.0 0.0 - 1.0 mg/dL   Nitrite POSITIVE (A) NEGATIVE   Leukocytes, UA TRACE (A) NEGATIVE  Urine microscopic-add on     Status: Abnormal   Collection Time: 01/25/15  7:57 PM  Result Value Ref Range   Squamous Epithelial / LPF FEW (A) RARE   WBC, UA 3-6 <3 WBC/hpf   RBC / HPF TOO NUMEROUS TO COUNT <3 RBC/hpf   Bacteria, UA MANY (A) RARE    MAU Course  Procedures  MDM   Assessment and Plan   1. Previous cesarean delivery affecting pregnancy, antepartum   2. UTI (lower urinary tract infection)   3. Supervision of normal pregnancy, second trimester    DC home Comfort measures reviewed  RX: keflex TID #21 Return to MAU as needed FU with OB as planned  Follow-up Information    Follow up with [facility].   Specialty:  Obstetrics and Gynecology   Why:  As scheduled   Contact information:   [address] [place] [address] [phone]        Mathis Bud 01/25/2015, 8:45 PM

## 2015-01-26 ENCOUNTER — Ambulatory Visit (INDEPENDENT_AMBULATORY_CARE_PROVIDER_SITE_OTHER): Payer: Self-pay | Admitting: Family Medicine

## 2015-01-26 VITALS — BP 97/65 | HR 106 | Wt 196.4 lb

## 2015-01-26 DIAGNOSIS — N898 Other specified noninflammatory disorders of vagina: Secondary | ICD-10-CM

## 2015-01-26 DIAGNOSIS — O26892 Other specified pregnancy related conditions, second trimester: Secondary | ICD-10-CM

## 2015-01-26 DIAGNOSIS — O4690 Antepartum hemorrhage, unspecified, unspecified trimester: Secondary | ICD-10-CM

## 2015-01-26 DIAGNOSIS — Z3492 Encounter for supervision of normal pregnancy, unspecified, second trimester: Secondary | ICD-10-CM

## 2015-01-26 NOTE — Patient Instructions (Addendum)
Threatened Miscarriage A threatened miscarriage occurs when you have vaginal bleeding during your first 20 weeks of pregnancy but the pregnancy has not ended. If you have vaginal bleeding during this time, your health care provider will do tests to make sure you are still pregnant. If the tests show you are still pregnant and the developing baby (fetus) inside your womb (uterus) is still growing, your condition is considered a threatened miscarriage. A threatened miscarriage does not mean your pregnancy will end, but it does increase the risk of losing your pregnancy (complete miscarriage). CAUSES  The cause of a threatened miscarriage is usually not known. If you go on to have a complete miscarriage, the most common cause is an abnormal number of chromosomes in the developing baby. Chromosomes are the structures inside cells that hold all your genetic material. Some causes of vaginal bleeding that do not result in miscarriage include:  Having sex.  Having an infection.  Normal hormone changes of pregnancy.  Bleeding that occurs when an egg implants in your uterus. RISK FACTORS Risk factors for bleeding in early pregnancy include:  Obesity.  Smoking.  Drinking excessive amounts of alcohol or caffeine.  Recreational drug use. SIGNS AND SYMPTOMS  Light vaginal bleeding.  Mild abdominal pain or cramps. DIAGNOSIS  If you have bleeding with or without abdominal pain before 20 weeks of pregnancy, your health care provider will do tests to check whether you are still pregnant. One important test involves using sound waves and a computer (ultrasound) to create images of the inside of your uterus. Other tests include an internal exam of your vagina and uterus (pelvic exam) and measurement of your baby's heart rate.  You may be diagnosed with a threatened miscarriage if:  Ultrasound testing shows you are still pregnant.  Your baby's heart rate is strong.  A pelvic exam shows that the  opening between your uterus and your vagina (cervix) is closed.  Your heart rate and blood pressure are stable.  Blood tests confirm you are still pregnant. TREATMENT  No treatments have been shown to prevent a threatened miscarriage from going on to a complete miscarriage. However, the right home care is important.  HOME CARE INSTRUCTIONS   Make sure you keep all your appointments for prenatal care. This is very important.  Get plenty of rest.  Do not have sex or use tampons if you have vaginal bleeding.  Do not douche.  Do not smoke or use recreational drugs.  Do not drink alcohol.  Avoid caffeine. SEEK MEDICAL CARE IF:  You have light vaginal bleeding or spotting while pregnant.  You have abdominal pain or cramping.  You have a fever. SEEK IMMEDIATE MEDICAL CARE IF:  You have heavy vaginal bleeding.  You have blood clots coming from your vagina.  You have severe low back pain or abdominal cramps.  You have fever, chills, and severe abdominal pain. MAKE SURE YOU:  Understand these instructions.  Will watch your condition.  Will get help right away if you are not doing well or get worse. Document Released: 06/05/2005 Document Revised: 06/10/2013 Document Reviewed: 04/01/2013 [facility] Patient Information 2015 [place], [place]. This information is not intended to replace advice given to you by your health care provider. Make sure you discuss any questions you have with your health care provider.  Breastfeeding Deciding to breastfeed is one of the best choices you can make for you and your baby. A change in hormones during pregnancy causes your breast tissue to grow and increases  the number and size of your milk ducts. These hormones also allow proteins, sugars, and fats from your blood supply to make breast milk in your milk-producing glands. Hormones prevent breast milk from being released before your baby is born as well as prompt milk flow after birth. Once  breastfeeding has begun, thoughts of your baby, as well as his or her sucking or crying, can stimulate the release of milk from your milk-producing glands.  BENEFITS OF BREASTFEEDING For Your Baby  Your first milk (colostrum) helps your baby's digestive system function better.   There are antibodies in your milk that help your baby fight off infections.   Your baby has a lower incidence of asthma, allergies, and sudden infant death syndrome.   The nutrients in breast milk are better for your baby than infant formulas and are designed uniquely for your baby's needs.   Breast milk improves your baby's brain development.   Your baby is less likely to develop other conditions, such as childhood obesity, asthma, or type 2 diabetes mellitus.  For You   Breastfeeding helps to create a very special bond between you and your baby.   Breastfeeding is convenient. Breast milk is always available at the correct temperature and costs nothing.   Breastfeeding helps to burn calories and helps you lose the weight gained during pregnancy.   Breastfeeding makes your uterus contract to its prepregnancy size faster and slows bleeding (lochia) after you give birth.   Breastfeeding helps to lower your risk of developing type 2 diabetes mellitus, osteoporosis, and breast or ovarian cancer later in life. SIGNS THAT YOUR BABY IS HUNGRY Early Signs of Hunger  Increased alertness or activity.  Stretching.  Movement of the head from side to side.  Movement of the head and opening of the mouth when the corner of the mouth or cheek is stroked (rooting).  Increased sucking sounds, smacking lips, cooing, sighing, or squeaking.  Hand-to-mouth movements.  Increased sucking of fingers or hands. Late Signs of Hunger  Fussing.  Intermittent crying. Extreme Signs of Hunger Signs of extreme hunger will require calming and consoling before your baby will be able to breastfeed successfully. Do not  wait for the following signs of extreme hunger to occur before you initiate breastfeeding:   Restlessness.  A loud, strong cry.   Screaming. BREASTFEEDING BASICS Breastfeeding Initiation  Find a comfortable place to sit or lie down, with your neck and back well supported.  Place a pillow or rolled up blanket under your baby to bring him or her to the level of your breast (if you are seated). Nursing pillows are specially designed to help support your arms and your baby while you breastfeed.  Make sure that your baby's abdomen is facing your abdomen.   Gently massage your breast. With your fingertips, massage from your chest wall toward your nipple in a circular motion. This encourages milk flow. You may need to continue this action during the feeding if your milk flows slowly.  Support your breast with 4 fingers underneath and your thumb above your nipple. Make sure your fingers are well away from your nipple and your baby's mouth.   Stroke your baby's lips gently with your finger or nipple.   When your baby's mouth is open wide enough, quickly bring your baby to your breast, placing your entire nipple and as much of the colored area around your nipple (areola) as possible into your baby's mouth.   More areola should be visible above your  baby's upper lip than below the lower lip.   Your baby's tongue should be between his or her lower gum and your breast.   Ensure that your baby's mouth is correctly positioned around your nipple (latched). Your baby's lips should create a seal on your breast and be turned out (everted).  It is common for your baby to suck about 2-3 minutes in order to start the flow of breast milk. Latching Teaching your baby how to latch on to your breast properly is very important. An improper latch can cause nipple pain and decreased milk supply for you and poor weight gain in your baby. Also, if your baby is not latched onto your nipple properly, he or she  may swallow some air during feeding. This can make your baby fussy. Burping your baby when you switch breasts during the feeding can help to get rid of the air. However, teaching your baby to latch on properly is still the best way to prevent fussiness from swallowing air while breastfeeding. Signs that your baby has successfully latched on to your nipple:    Silent tugging or silent sucking, without causing you pain.   Swallowing heard between every 3-4 sucks.    Muscle movement above and in front of his or her ears while sucking.  Signs that your baby has not successfully latched on to nipple:   Sucking sounds or smacking sounds from your baby while breastfeeding.  Nipple pain. If you think your baby has not latched on correctly, slip your finger into the corner of your baby's mouth to break the suction and place it between your baby's gums. Attempt breastfeeding initiation again. Signs of Successful Breastfeeding Signs from your baby:   A gradual decrease in the number of sucks or complete cessation of sucking.   Falling asleep.   Relaxation of his or her body.   Retention of a small amount of milk in his or her mouth.   Letting go of your breast by himself or herself. Signs from you:  Breasts that have increased in firmness, weight, and size 1-3 hours after feeding.   Breasts that are softer immediately after breastfeeding.  Increased milk volume, as well as a change in milk consistency and color by the fifth day of breastfeeding.   Nipples that are not sore, cracked, or bleeding. Signs That Your [employer] is Getting Enough Milk  Wetting at least 3 diapers in a 24-hour period. The urine should be clear and pale yellow by age 32 days.  At least 3 stools in a 24-hour period by age 32 days. The stool should be soft and yellow.  At least 3 stools in a 24-hour period by age 66 days. The stool should be seedy and yellow.  No loss of weight greater than 10% of birth weight  during the first 38 days of age.  Average weight gain of 4-7 ounces (113-198 g) per week after age 66 days.  Consistent daily weight gain by age 325 days, without weight loss after the age of 2 weeks. After a feeding, your baby may spit up a small amount. This is common. BREASTFEEDING FREQUENCY AND DURATION Frequent feeding will help you make more milk and can prevent sore nipples and breast engorgement. Breastfeed when you feel the need to reduce the fullness of your breasts or when your baby shows signs of hunger. This is called "breastfeeding on demand." Avoid introducing a pacifier to your baby while you are working to establish breastfeeding (the first 4-6  weeks after your baby is born). After this time you may choose to use a pacifier. Research has shown that pacifier use during the first year of a baby's life decreases the risk of sudden infant death syndrome (SIDS). Allow your baby to feed on each breast as long as he or she wants. Breastfeed until your baby is finished feeding. When your baby unlatches or falls asleep while feeding from the first breast, offer the second breast. Because newborns are often sleepy in the first few weeks of life, you may need to awaken your baby to get him or her to feed. Breastfeeding times will vary from baby to baby. However, the following rules can serve as a guide to help you ensure that your baby is properly fed:  Newborns (babies 63 weeks of age or younger) may breastfeed every 1-3 hours.  Newborns should not go longer than 3 hours during the day or 5 hours during the night without breastfeeding.  You should breastfeed your baby a minimum of 8 times in a 24-hour period until you begin to introduce solid foods to your baby at around 45 months of age. BREAST MILK PUMPING Pumping and storing breast milk allows you to ensure that your baby is exclusively fed your breast milk, even at times when you are unable to breastfeed. This is especially important if you are  going back to work while you are still breastfeeding or when you are not able to be present during feedings. Your lactation consultant can give you guidelines on how long it is safe to store breast milk.  A breast pump is a machine that allows you to pump milk from your breast into a sterile bottle. The pumped breast milk can then be stored in a refrigerator or freezer. Some breast pumps are operated by hand, while others use electricity. Ask your lactation consultant which type will work best for you. Breast pumps can be purchased, but some hospitals and breastfeeding support groups lease breast pumps on a monthly basis. A lactation consultant can teach you how to hand express breast milk, if you prefer not to use a pump.  CARING FOR YOUR BREASTS WHILE YOU BREASTFEED Nipples can become dry, cracked, and sore while breastfeeding. The following recommendations can help keep your breasts moisturized and healthy:  Avoid using soap on your nipples.   Wear a supportive bra. Although not required, special nursing bras and tank tops are designed to allow access to your breasts for breastfeeding without taking off your entire bra or top. Avoid wearing underwire-style bras or extremely tight bras.  Air dry your nipples for 3-34minutes after each feeding.   Use only cotton bra pads to absorb leaked breast milk. Leaking of breast milk between feedings is normal.   Use lanolin on your nipples after breastfeeding. Lanolin helps to maintain your skin's normal moisture barrier. If you use pure lanolin, you do not need to wash it off before feeding your baby again. Pure lanolin is not toxic to your baby. You may also hand express a few drops of breast milk and gently massage that milk into your nipples and allow the milk to air dry. In the first few weeks after giving birth, some women experience extremely full breasts (engorgement). Engorgement can make your breasts feel heavy, warm, and tender to the touch.  Engorgement peaks within 3-5 days after you give birth. The following recommendations can help ease engorgement:  Completely empty your breasts while breastfeeding or pumping. You may want to start  by applying warm, moist heat (in the shower or with warm water-soaked hand towels) just before feeding or pumping. This increases circulation and helps the milk flow. If your baby does not completely empty your breasts while breastfeeding, pump any extra milk after he or she is finished.  Wear a snug bra (nursing or regular) or tank top for 1-2 days to signal your body to slightly decrease milk production.  Apply ice packs to your breasts, unless this is too uncomfortable for you.  Make sure that your baby is latched on and positioned properly while breastfeeding. If engorgement persists after 48 hours of following these recommendations, contact your health care provider or a Science writer. OVERALL HEALTH CARE RECOMMENDATIONS WHILE BREASTFEEDING  Eat healthy foods. Alternate between meals and snacks, eating 3 of each per day. Because what you eat affects your breast milk, some of the foods may make your baby more irritable than usual. Avoid eating these foods if you are sure that they are negatively affecting your baby.  Drink milk, fruit juice, and water to satisfy your thirst (about 10 glasses a day).   Rest often, relax, and continue to take your prenatal vitamins to prevent fatigue, stress, and anemia.  Continue breast self-awareness checks.  Avoid chewing and smoking tobacco.  Avoid alcohol and drug use. Some medicines that may be harmful to your baby can pass through breast milk. It is important to ask your health care provider before taking any medicine, including all over-the-counter and prescription medicine as well as vitamin and herbal supplements. It is possible to become pregnant while breastfeeding. If birth control is desired, ask your health care provider about options that  will be safe for your baby. SEEK MEDICAL CARE IF:   You feel like you want to stop breastfeeding or have become frustrated with breastfeeding.  You have painful breasts or nipples.  Your nipples are cracked or bleeding.  Your breasts are red, tender, or warm.  You have a swollen area on either breast.  You have a fever or chills.  You have nausea or vomiting.  You have drainage other than breast milk from your nipples.  Your breasts do not become full before feedings by the fifth day after you give birth.  You feel sad and depressed.  Your baby is too sleepy to eat well.  Your baby is having trouble sleeping.   Your baby is wetting less than 3 diapers in a 24-hour period.  Your baby has less than 3 stools in a 24-hour period.  Your baby's skin or the white part of his or her eyes becomes yellow.   Your baby is not gaining weight by 24 days of age. SEEK IMMEDIATE MEDICAL CARE IF:   Your baby is overly tired (lethargic) and does not want to wake up and feed.  Your baby develops an unexplained fever. Document Released: 06/05/2005 Document Revised: 06/10/2013 Document Reviewed: 11/27/2012 [facility] Patient Information 2015 [place], [place]. This information is not intended to replace advice given to you by your health care provider. Make sure you discuss any questions you have with your health care provider.

## 2015-01-26 NOTE — Progress Notes (Signed)
Pt noticed spotting after intercourse on Sunday, then had another episode of spotting and no intercourse at that time, seen at [facility] yesterday given Cephalexin for UTI and told to f/u here today.

## 2015-01-26 NOTE — Progress Notes (Signed)
Subjective:  [name] is a 29 y.o. G3P1011 at [redacted]w[redacted]d being seen today for ongoing prenatal care.  Patient reports vaginal spotting.  Seen in [facility] for same, diagnosed with UTI.  On antibiotics. Still spotting..  Contractions: Not present.  Vag. Bleeding: Scant. Movement: Present. Denies leaking of fluid.   The following portions of the patient's history were reviewed and updated as appropriate: allergies, current medications, past family history, past medical history, past social history, past surgical history and problem list.   Objective:   Filed Vitals:   01/26/15 1555  BP: 97/65  Pulse: 106  Weight: 196 lb 6.4 oz (89.086 kg)    Fetal Status: Fetal Heart Rate (bpm): 165     General:  Alert, oriented and cooperative. Patient is in no acute distress.  Skin: Skin is warm and dry. No rash noted.   Cardiovascular: Normal heart rate noted  Respiratory: Normal respiratory effort, no problems with respiration noted  Abdomen: Soft, gravid, appropriate for gestational age. Pain/Pressure: Present     Pelvic: Vag. Bleeding: Scant Vag D/C Character: Other (Comment)   Cervical exam performed      cervix is closed and long  Extremities: Normal range of motion.  Edema: None  Mental Status: Normal mood and affect. Normal behavior. Normal judgment and thought content.     Assessment and Plan:  Pregnancy: G3P1011 at [redacted]w[redacted]d  1. Supervision of normal pregnancy, second trimester Continue routine prenatal care.  2. Vaginal bleeding during pregnancy, antepartum Check Wet prep Pelvic rest. Bleeding precautions given  Please refer to After Visit Summary for other counseling recommendations.  Return in 4 weeks (on 02/23/2015).   Donnamae Jude, MD

## 2015-01-27 ENCOUNTER — Telehealth: Payer: Self-pay | Admitting: *Deleted

## 2015-01-27 ENCOUNTER — Other Ambulatory Visit (HOSPITAL_COMMUNITY): Payer: Self-pay | Admitting: Family Medicine

## 2015-01-27 ENCOUNTER — Encounter: Payer: Self-pay | Admitting: Certified Nurse Midwife

## 2015-01-27 LAB — WET PREP, GENITAL
Trich, Wet Prep: NONE SEEN
YEAST WET PREP: NONE SEEN

## 2015-01-27 NOTE — Telephone Encounter (Signed)
-----   Message from Donnamae Jude, MD sent at 01/27/2015  7:52 AM EDT ----- Needs Flagyl 500 mg po bid x 7 days--continue her other abx.

## 2015-01-28 LAB — URINE CULTURE

## 2015-02-12 ENCOUNTER — Emergency Department (HOSPITAL_COMMUNITY)
Admission: EM | Admit: 2015-02-12 | Discharge: 2015-02-12 | Disposition: A | Discharge status: Home / Self Care | Payer: Self-pay | Attending: Emergency Medicine | Admitting: Emergency Medicine

## 2015-02-12 ENCOUNTER — Encounter (HOSPITAL_COMMUNITY): Payer: Self-pay | Admitting: *Deleted

## 2015-02-12 DIAGNOSIS — Z91040 Latex allergy status: Secondary | ICD-10-CM | POA: Insufficient documentation

## 2015-02-12 DIAGNOSIS — O210 Mild hyperemesis gravidarum: Secondary | ICD-10-CM | POA: Insufficient documentation

## 2015-02-12 DIAGNOSIS — Z8679 Personal history of other diseases of the circulatory system: Secondary | ICD-10-CM | POA: Insufficient documentation

## 2015-02-12 DIAGNOSIS — Z87442 Personal history of urinary calculi: Secondary | ICD-10-CM | POA: Insufficient documentation

## 2015-02-12 DIAGNOSIS — Z87891 Personal history of nicotine dependence: Secondary | ICD-10-CM | POA: Insufficient documentation

## 2015-02-12 DIAGNOSIS — O219 Vomiting of pregnancy, unspecified: Secondary | ICD-10-CM

## 2015-02-12 DIAGNOSIS — Z3A15 15 weeks gestation of pregnancy: Secondary | ICD-10-CM | POA: Insufficient documentation

## 2015-02-12 HISTORY — DX: Migraine, unspecified, not intractable, without status migrainosus: G43.909

## 2015-02-12 LAB — COMPREHENSIVE METABOLIC PANEL
ALT: 12 U/L — ABNORMAL LOW (ref 14–54)
AST: 18 U/L (ref 15–41)
Albumin: 3.1 g/dL — ABNORMAL LOW (ref 3.5–5.0)
Alkaline Phosphatase: 61 U/L (ref 38–126)
Anion gap: 9 (ref 5–15)
BUN: <5 mg/dL — ABNORMAL LOW (ref 6–20)
CHLORIDE: 107 mmol/L (ref 101–111)
CO2: 21 mmol/L — AB (ref 22–32)
Calcium: 9.3 mg/dL (ref 8.9–10.3)
Creatinine, Ser: 0.49 mg/dL (ref 0.44–1.00)
Glucose, Bld: 85 mg/dL (ref 65–99)
POTASSIUM: 3.8 mmol/L (ref 3.5–5.1)
SODIUM: 137 mmol/L (ref 135–145)
Total Bilirubin: 0.4 mg/dL (ref 0.3–1.2)
Total Protein: 7 g/dL (ref 6.5–8.1)

## 2015-02-12 LAB — URINALYSIS, ROUTINE W REFLEX MICROSCOPIC
Bilirubin Urine: NEGATIVE
Glucose, UA: NEGATIVE mg/dL
Hgb urine dipstick: NEGATIVE
Nitrite: NEGATIVE
PH: 6 (ref 5.0–8.0)
Protein, ur: NEGATIVE mg/dL
SPECIFIC GRAVITY, URINE: 1.029 (ref 1.005–1.030)
Urobilinogen, UA: 1 mg/dL (ref 0.0–1.0)

## 2015-02-12 LAB — URINE MICROSCOPIC-ADD ON

## 2015-02-12 LAB — CBC
HEMATOCRIT: 35 % — AB (ref 36.0–46.0)
Hemoglobin: 11.4 g/dL — ABNORMAL LOW (ref 12.0–15.0)
MCH: 27.5 pg (ref 26.0–34.0)
MCHC: 32.6 g/dL (ref 30.0–36.0)
MCV: 84.3 fL (ref 78.0–100.0)
Platelets: 236 10*3/uL (ref 150–400)
RBC: 4.15 MIL/uL (ref 3.87–5.11)
RDW: 14.7 % (ref 11.5–15.5)
WBC: 9.5 10*3/uL (ref 4.0–10.5)

## 2015-02-12 LAB — I-STAT BETA HCG BLOOD, ED (MC, WL, AP ONLY): I-stat hCG, quantitative: >2000 m[IU]/mL — ABNORMAL HIGH (ref <5)

## 2015-02-12 LAB — LIPASE, BLOOD: LIPASE: 17 U/L — AB (ref 22–51)

## 2015-02-12 MED ORDER — ACETAMINOPHEN 325 MG PO TABS
650.0000 mg | ORAL_TABLET | Freq: Once | ORAL | Status: AC
  Administered 2015-02-12: 650 mg via ORAL
  Filled 2015-02-12: qty 2

## 2015-02-12 MED ORDER — ONDANSETRON HCL 4 MG/2ML IJ SOLN
4.0000 mg | Freq: Once | INTRAMUSCULAR | Status: AC
  Administered 2015-02-12: 4 mg via INTRAVENOUS
  Filled 2015-02-12: qty 2

## 2015-02-12 MED ORDER — ONDANSETRON 4 MG PO TBDP: ORAL_TABLET | ORAL | Status: DC

## 2015-02-12 MED ORDER — SODIUM CHLORIDE 0.9 % IV BOLUS (SEPSIS)
1000.0000 mL | Freq: Once | INTRAVENOUS | Status: AC
  Administered 2015-02-12: 1000 mL via INTRAVENOUS

## 2015-02-12 NOTE — ED Notes (Signed)
PO Challenge complete no adverse reactions noted.

## 2015-02-12 NOTE — ED Notes (Signed)
Pt left with all belongings and ambulated out of treatment area.  

## 2015-02-12 NOTE — ED Notes (Signed)
Pt reports being approx [redacted] weeks pregnant. Having dizziness, headaches, n/v, no appetite since Tuesday. bp 110/68 at triage. No acute distress noted.

## 2015-02-12 NOTE — Discharge Instructions (Signed)
Hyperemesis Gravidarum °Hyperemesis gravidarum is a severe form of nausea and vomiting that happens during pregnancy. Hyperemesis is worse than morning sickness. It may cause you to have nausea or vomiting all day for many days. It may keep you from eating and drinking enough food and liquids. Hyperemesis usually occurs during the first half (the first 20 weeks) of pregnancy. It often goes away once a woman is in her second half of pregnancy. However, sometimes hyperemesis continues through an entire pregnancy.  °CAUSES  °The cause of this condition is not completely known but is thought to be related to changes in the body's hormones when pregnant. It could be from the high level of the pregnancy hormone or an increase in estrogen in the body.  °SIGNS AND SYMPTOMS  °· Severe nausea and vomiting. °· Nausea that does not go away. °· Vomiting that does not allow you to keep any food down. °· Weight loss and body fluid loss (dehydration). °· Having no desire to eat or not liking food you have previously enjoyed. °DIAGNOSIS  °Your health care provider will do a physical exam and ask you about your symptoms. He or she may also order blood tests and urine tests to make sure something else is not causing the problem.  °TREATMENT  °You may only need medicine to control the problem. If medicines do not control the nausea and vomiting, you will be treated in the hospital to prevent dehydration, increased acid in the blood (acidosis), weight loss, and changes in the electrolytes in your body that may harm the unborn baby (fetus). You may need IV fluids.  °HOME CARE INSTRUCTIONS  °· Only take over-the-counter or prescription medicines as directed by your health care provider. °· Try eating a couple of dry crackers or toast in the morning before getting out of bed. °· Avoid foods and smells that upset your stomach. °· Avoid fatty and spicy foods. °· Eat 5-6 small meals a day. °· Do not drink when eating meals. Drink between  meals. °· For snacks, eat high-protein foods, such as cheese. °· Eat or suck on things that have ginger in them. Ginger helps nausea. °· Avoid food preparation. The smell of food can spoil your appetite. °· Avoid iron pills and iron in your multivitamins until after 3-4 months of being pregnant. However, consult with your health care provider before stopping any prescribed iron pills. °SEEK MEDICAL CARE IF:  °· Your abdominal pain increases. °· You have a severe headache. °· You have vision problems. °· You are losing weight. °SEEK IMMEDIATE MEDICAL CARE IF:  °· You are unable to keep fluids down. °· You vomit blood. °· You have constant nausea and vomiting. °· You have excessive weakness. °· You have extreme thirst. °· You have dizziness or fainting. °· You have a fever or persistent symptoms for more than 2-3 days. °· You have a fever and your symptoms suddenly get worse. °MAKE SURE YOU:  °· Understand these instructions. °· Will watch your condition. °· Will get help right away if you are not doing well or get worse. °Document Released: 06/05/2005 Document Revised: 03/26/2013 Document Reviewed: 01/15/2013 °[employer]® Patient Information ©2015 [employer]. This information is not intended to replace advice given to you by your health care provider. Make sure you discuss any questions you have with your health care provider. ° °

## 2015-02-12 NOTE — ED Provider Notes (Signed)
CSN: [id number]     Arrival date & time 02/12/15  1318 History   First MD Initiated Contact with Patient 02/12/15 1510     Chief Complaint  Patient presents with  . Emesis  . Headache  . Dizziness     (Consider location/radiation/quality/duration/timing/severity/associated sxs/prior Treatment) HPI Comments: G3,P1, currently at about 15 weeks.  First pregnancy ended in miscarriage at 5 weeks. Second pregnancy full term, delivered by c-section. OB is Nurse, mental health at [facility]).  Patient is a 29 y.o. female presenting with vomiting, headaches, and dizziness. The history is provided by the patient. No language interpreter was used.  Emesis Severity:  Moderate Duration:  3 days Timing:  Intermittent Quality:  Stomach contents Progression:  Worsening Chronicity:  New Associated symptoms: headaches   Headaches:    Severity:  Moderate   Duration:  3 days   Chronicity:  Recurrent Risk factors: pregnant now   Headache Associated symptoms: dizziness, fatigue and vomiting   Dizziness Quality:  Imbalance Severity:  Moderate Duration:  3 days Progression:  Worsening Context: standing up   Associated symptoms: headaches and vomiting     Past Medical History  Diagnosis Date  . Kidney stones   . Kidney stones   . Kidney stones   . Uterine fibroid   . Migraine    Past Surgical History  Procedure Laterality Date  . Renal artery stent      placed and removed in 2005  . Cesarean section      Nov 2011   Family History  Problem Relation Age of Onset  . Hypertension Mother   . Depression Mother   . Diabetes Father   . Hypertension Father   . Heart disease Father   . Cancer Father     foot   Social History  Substance Use Topics  . Smoking status: Former Research scientist (life sciences)  . Smokeless tobacco: None  . Alcohol Use: No   OB History    Gravida Para Term Preterm AB TAB SAB Ectopic Multiple Living   3 1 1  1  1   1      Review of Systems  Constitutional: Positive  for fatigue.  Gastrointestinal: Positive for vomiting.  Neurological: Positive for dizziness and headaches.  All other systems reviewed and are negative.     Allergies  Erythromycin base; Ciprofloxacin; Watermelon flavor; Latex; and Erythromycin  Home Medications   Prior to Admission medications   Medication Sig Start Date End Date Taking? Authorizing Provider  acetaminophen (TYLENOL) 500 MG tablet Take 1,000 mg by mouth every 6 (six) hours as needed for moderate pain or headache.     Historical Provider, MD  cephALEXin (KEFLEX) 500 MG capsule Take 1 capsule (500 mg total) by mouth 3 (three) times daily. 01/25/15   Tresea Mall, CNM  diphenhydramine-acetaminophen (TYLENOL PM) 25-500 MG TABS Take 2 tablets by mouth at bedtime as needed (sleep).    Historical Provider, MD   BP 110/68 mmHg  Pulse 91  Temp(Src) 98 F (36.7 C) (Oral)  Resp 18  SpO2 97%  LMP 10/26/2014 (Exact Date) Physical Exam  Constitutional: She is oriented to person, place, and time. She appears well-developed and well-nourished.  HENT:  Head: Normocephalic.  Eyes: Pupils are equal, round, and reactive to light.  Neck: Neck supple.  Cardiovascular: Normal rate and regular rhythm.   Pulmonary/Chest: Effort normal and breath sounds normal.  Abdominal: Soft.  Musculoskeletal: She exhibits no edema or tenderness.  Lymphadenopathy:    She has no  cervical adenopathy.  Neurological: She is alert and oriented to person, place, and time. No cranial nerve deficit.  Skin: Skin is warm and dry.  Psychiatric: She has a normal mood and affect.  Nursing note and vitals reviewed.   ED Course  Procedures (including critical care time) Labs Review Labs Reviewed  LIPASE, BLOOD - Abnormal; Notable for the following:    Lipase 17 (*)    All other components within normal limits  COMPREHENSIVE METABOLIC PANEL - Abnormal; Notable for the following:    CO2 21 (*)    BUN <5 (*)    Albumin 3.1 (*)    ALT 12 (*)    All  other components within normal limits  CBC - Abnormal; Notable for the following:    Hemoglobin 11.4 (*)    HCT 35.0 (*)    All other components within normal limits  URINALYSIS, ROUTINE W REFLEX MICROSCOPIC (NOT AT [facility]) - Abnormal; Notable for the following:    Color, Urine AMBER (*)    APPearance CLOUDY (*)    Ketones, ur >80 (*)    Leukocytes, UA TRACE (*)    All other components within normal limits  URINE MICROSCOPIC-ADD ON - Abnormal; Notable for the following:    Squamous Epithelial / LPF MANY (*)    All other components within normal limits  I-STAT BETA HCG BLOOD, ED ([facility], [facility], [facility] ONLY) - Abnormal; Notable for the following:    I-stat hCG, quantitative >2000.0 (*)    All other components within normal limits    Imaging Review No results found. I have personally reviewed and evaluated these images and lab results as part of my medical decision-making.   EKG Interpretation None     Patient discussed with and seen by Dr. Oleta Mouse. Patient initially orthostatic. Feels better after IV fluids and medication. Tolerating po fluids without difficulty. Orthostasis resolved.  Discharged home with limited zofran for nausea/vomiting.  Return precautions discussed. Patient to follow-up with her OB early next week. MDM   Final diagnoses:  None    Hyperemesis gravidarum.     Etta Quill, NP 02/13/15 0104  Forde Dandy, MD 02/13/15 [phone]

## 2015-02-15 ENCOUNTER — Telehealth: Payer: Self-pay | Admitting: *Deleted

## 2015-02-15 NOTE — Telephone Encounter (Signed)
Pt is [redacted] weeks pregnant and has been experiencing N/V for the past 2 weeks, was seen in MAU and given a rx for Zofran 4 mg, pt has had little relief with the Zofran.  Requesting different medication to help with the N/V.  Informed pt that Dr Hulan Fray would be in office at 1:00 pm today and would check with her to see what she recommends and would call her back when I received a new order or recommendation. Pt acknowledged.

## 2015-02-15 NOTE — Telephone Encounter (Signed)
Spoke to Dr Hulan Fray about pt N/V, recommended trying Diclegis, pt is going to come by office to get samples to try before rx is called to pharmacy. Pt will check with husband to come by and pick up samples.

## 2015-02-26 ENCOUNTER — Encounter (HOSPITAL_COMMUNITY): Payer: Self-pay

## 2015-02-26 ENCOUNTER — Inpatient Hospital Stay (HOSPITAL_COMMUNITY)
Admission: AD | Admit: 2015-02-26 | Discharge: 2015-02-26 | Disposition: A | Discharge status: Home / Self Care | Payer: Self-pay | Source: Ambulatory Visit | Attending: Obstetrics & Gynecology | Admitting: Obstetrics & Gynecology

## 2015-02-26 DIAGNOSIS — O210 Mild hyperemesis gravidarum: Secondary | ICD-10-CM | POA: Insufficient documentation

## 2015-02-26 DIAGNOSIS — Z3A17 17 weeks gestation of pregnancy: Secondary | ICD-10-CM | POA: Insufficient documentation

## 2015-02-26 DIAGNOSIS — O219 Vomiting of pregnancy, unspecified: Secondary | ICD-10-CM

## 2015-02-26 DIAGNOSIS — N390 Urinary tract infection, site not specified: Secondary | ICD-10-CM

## 2015-02-26 DIAGNOSIS — Z87442 Personal history of urinary calculi: Secondary | ICD-10-CM | POA: Insufficient documentation

## 2015-02-26 DIAGNOSIS — E860 Dehydration: Secondary | ICD-10-CM

## 2015-02-26 DIAGNOSIS — Z881 Allergy status to other antibiotic agents status: Secondary | ICD-10-CM | POA: Insufficient documentation

## 2015-02-26 DIAGNOSIS — Z87891 Personal history of nicotine dependence: Secondary | ICD-10-CM | POA: Insufficient documentation

## 2015-02-26 LAB — URINALYSIS, ROUTINE W REFLEX MICROSCOPIC
BILIRUBIN URINE: NEGATIVE
GLUCOSE, UA: NEGATIVE mg/dL
Ketones, ur: 40 mg/dL — AB
Leukocytes, UA: NEGATIVE
Nitrite: POSITIVE — AB
PH: 6 (ref 5.0–8.0)
Protein, ur: NEGATIVE mg/dL
Specific Gravity, Urine: >1.03 — ABNORMAL HIGH (ref 1.005–1.030)
UROBILINOGEN UA: 0.2 mg/dL (ref 0.0–1.0)

## 2015-02-26 LAB — COMPREHENSIVE METABOLIC PANEL
ALBUMIN: 3.2 g/dL — AB (ref 3.5–5.0)
ALT: 13 U/L — AB (ref 14–54)
AST: 20 U/L (ref 15–41)
Alkaline Phosphatase: 76 U/L (ref 38–126)
Anion gap: 9 (ref 5–15)
BUN: <5 mg/dL — ABNORMAL LOW (ref 6–20)
CHLORIDE: 107 mmol/L (ref 101–111)
CO2: 20 mmol/L — ABNORMAL LOW (ref 22–32)
Calcium: 8.9 mg/dL (ref 8.9–10.3)
Creatinine, Ser: 0.42 mg/dL — ABNORMAL LOW (ref 0.44–1.00)
GFR calc Af Amer: >60 mL/min (ref >60)
Glucose, Bld: 85 mg/dL (ref 65–99)
POTASSIUM: 3.8 mmol/L (ref 3.5–5.1)
SODIUM: 136 mmol/L (ref 135–145)
Total Bilirubin: 0.4 mg/dL (ref 0.3–1.2)
Total Protein: 7.3 g/dL (ref 6.5–8.1)

## 2015-02-26 LAB — URINE MICROSCOPIC-ADD ON

## 2015-02-26 MED ORDER — SODIUM CHLORIDE 0.9 % IV SOLN
8.0000 mg | Freq: Once | INTRAVENOUS | Status: AC
  Administered 2015-02-26: 8 mg via INTRAVENOUS
  Filled 2015-02-26: qty 4

## 2015-02-26 MED ORDER — NITROFURANTOIN MONOHYD MACRO 100 MG PO CAPS: 100.0000 mg | ORAL_CAPSULE | Freq: Two times a day (BID) | ORAL | Status: AC

## 2015-02-26 MED ORDER — ONDANSETRON 4 MG PO TBDP: ORAL_TABLET | ORAL | Status: DC

## 2015-02-26 MED ORDER — SODIUM CHLORIDE 0.9 % IV BOLUS (SEPSIS)
500.0000 mL | Freq: Once | INTRAVENOUS | Status: AC
  Administered 2015-02-26: 500 mL via INTRAVENOUS

## 2015-02-26 MED ORDER — PROMETHAZINE HCL 25 MG PO TABS: 25.0000 mg | ORAL_TABLET | Freq: Four times a day (QID) | ORAL | Status: DC | PRN

## 2015-02-26 NOTE — MAU Note (Signed)
Pt stated she has been having unrelieved N/V. Taking Dicliges wich had been working up to about 2 weeks ago. Can't keep food down at all. Feels weak and dizzy

## 2015-02-26 NOTE — Discharge Instructions (Signed)
Hyperemesis Gravidarum Hyperemesis gravidarum is a severe form of nausea and vomiting that happens during pregnancy. Hyperemesis is worse than morning sickness. It may cause you to have nausea or vomiting all day for many days. It may keep you from eating and drinking enough food and liquids. Hyperemesis usually occurs during the first half (the first 20 weeks) of pregnancy. It often goes away once a woman is in her second half of pregnancy. However, sometimes hyperemesis continues through an entire pregnancy.  CAUSES  The cause of this condition is not completely known but is thought to be related to changes in the body's hormones when pregnant. It could be from the high level of the pregnancy hormone or an increase in estrogen in the body.  SIGNS AND SYMPTOMS   Severe nausea and vomiting.  Nausea that does not go away.  Vomiting that does not allow you to keep any food down.  Weight loss and body fluid loss (dehydration).  Having no desire to eat or not liking food you have previously enjoyed. DIAGNOSIS  Your health care provider will do a physical exam and ask you about your symptoms. He or she may also order blood tests and urine tests to make sure something else is not causing the problem.  TREATMENT  You may only need medicine to control the problem. If medicines do not control the nausea and vomiting, you will be treated in the hospital to prevent dehydration, increased acid in the blood (acidosis), weight loss, and changes in the electrolytes in your body that may harm the unborn baby (fetus). You may need IV fluids.  HOME CARE INSTRUCTIONS   Only take over-the-counter or prescription medicines as directed by your health care provider.  Try eating a couple of dry crackers or toast in the morning before getting out of bed.  Avoid foods and smells that upset your stomach.  Avoid fatty and spicy foods.  Eat 5-6 small meals a day.  Do not drink when eating meals. Drink between  meals.  For snacks, eat high-protein foods, such as cheese.  Eat or suck on things that have ginger in them. Ginger helps nausea.  Avoid food preparation. The smell of food can spoil your appetite.  Avoid iron pills and iron in your multivitamins until after 3-4 months of being pregnant. However, consult with your health care provider before stopping any prescribed iron pills. SEEK MEDICAL CARE IF:   Your abdominal pain increases.  You have a severe headache.  You have vision problems.  You are losing weight. SEEK IMMEDIATE MEDICAL CARE IF:   You are unable to keep fluids down.  You vomit blood.  You have constant nausea and vomiting.  You have excessive weakness.  You have extreme thirst.  You have dizziness or fainting.  You have a fever or persistent symptoms for more than 2-3 days.  You have a fever and your symptoms suddenly get worse. MAKE SURE YOU:   Understand these instructions.  Will watch your condition.  Will get help right away if you are not doing well or get worse. Document Released: 06/05/2005 Document Revised: 03/26/2013 Document Reviewed: 01/15/2013 [facility] Patient Information 2015 [place], [place]. This information is not intended to replace advice given to you by your health care provider. Make sure you discuss any questions you have with your health care provider. Nausea and Vomiting Nausea is a sick feeling that often comes before throwing up (vomiting). Vomiting is a reflex where stomach contents come out of your mouth.  Vomiting can cause severe loss of body fluids (dehydration). Children and elderly adults can become dehydrated quickly, especially if they also have diarrhea. Nausea and vomiting are symptoms of a condition or disease. It is important to find the cause of your symptoms. CAUSES   Direct irritation of the stomach lining. This irritation can result from increased acid production (gastroesophageal reflux disease), infection, food  poisoning, taking certain medicines (such as nonsteroidal anti-inflammatory drugs), alcohol use, or tobacco use.  Signals from the brain.These signals could be caused by a headache, heat exposure, an inner ear disturbance, increased pressure in the brain from injury, infection, a tumor, or a concussion, pain, emotional stimulus, or metabolic problems.  An obstruction in the gastrointestinal tract (bowel obstruction).  Illnesses such as diabetes, hepatitis, gallbladder problems, appendicitis, kidney problems, cancer, sepsis, atypical symptoms of a heart attack, or eating disorders.  Medical treatments such as chemotherapy and radiation.  Receiving medicine that makes you sleep (general anesthetic) during surgery. DIAGNOSIS Your caregiver may ask for tests to be done if the problems do not improve after a few days. Tests may also be done if symptoms are severe or if the reason for the nausea and vomiting is not clear. Tests may include:  Urine tests.  Blood tests.  Stool tests.  Cultures (to look for evidence of infection).  X-rays or other imaging studies. Test results can help your caregiver make decisions about treatment or the need for additional tests. TREATMENT You need to stay well hydrated. Drink frequently but in small amounts.You may wish to drink water, sports drinks, clear broth, or eat frozen ice pops or gelatin dessert to help stay hydrated.When you eat, eating slowly may help prevent nausea.There are also some antinausea medicines that may help prevent nausea. HOME CARE INSTRUCTIONS   Take all medicine as directed by your caregiver.  If you do not have an appetite, do not force yourself to eat. However, you must continue to drink fluids.  If you have an appetite, eat a normal diet unless your caregiver tells you differently.  Eat a variety of complex carbohydrates (rice, wheat, potatoes, bread), lean meats, yogurt, fruits, and vegetables.  Avoid high-fat foods  because they are more difficult to digest.  Drink enough water and fluids to keep your urine clear or pale yellow.  If you are dehydrated, ask your caregiver for specific rehydration instructions. Signs of dehydration may include:  Severe thirst.  Dry lips and mouth.  Dizziness.  Dark urine.  Decreasing urine frequency and amount.  Confusion.  Rapid breathing or pulse. SEEK IMMEDIATE MEDICAL CARE IF:   You have blood or brown flecks (like coffee grounds) in your vomit.  You have black or bloody stools.  You have a severe headache or stiff neck.  You are confused.  You have severe abdominal pain.  You have chest pain or trouble breathing.  You do not urinate at least once every 8 hours.  You develop cold or clammy skin.  You continue to vomit for longer than 24 to 48 hours.  You have a fever. MAKE SURE YOU:   Understand these instructions.  Will watch your condition.  Will get help right away if you are not doing well or get worse. Document Released: 06/05/2005 Document Revised: 08/28/2011 Document Reviewed: 11/02/2010 [facility] Patient Information 2015 [place], [place]. This information is not intended to replace advice given to you by your health care provider. Make sure you discuss any questions you have with your health care provider. Pregnancy and  Urinary Tract Infection A urinary tract infection (UTI) is a bacterial infection of the urinary tract. Infection of the urinary tract can include the ureters, kidneys (pyelonephritis), bladder (cystitis), and urethra (urethritis). All pregnant women should be screened for bacteria in the urinary tract. Identifying and treating a UTI will decrease the risk of preterm labor and developing more serious infections in both the mother and baby. CAUSES Bacteria germs cause almost all UTIs.  RISK FACTORS Many factors can increase your chances of getting a UTI during pregnancy. These include:  Having a short urethra.  Poor  toilet and hygiene habits.  Sexual intercourse.  Blockage of urine along the urinary tract.  Problems with the pelvic muscles or nerves.  Diabetes.  Obesity.  Bladder problems after having several children.  Previous history of UTI. SIGNS AND SYMPTOMS   Pain, burning, or a stinging feeling when urinating.  Suddenly feeling the need to urinate right away (urgency).  Loss of bladder control (urinary incontinence).  Frequent urination, more than is common with pregnancy.  Lower abdominal or back discomfort.  Cloudy urine.  Blood in the urine (hematuria).  Fever. When the kidneys are infected, the symptoms may be:  Back pain.  Flank pain on the right side more so than the left.  Fever.  Chills.  Nausea.  Vomiting. DIAGNOSIS  A urinary tract infection is usually diagnosed through urine tests. Additional tests and procedures are sometimes done. These may include:  Ultrasound exam of the kidneys, ureters, bladder, and urethra.  Looking in the bladder with a lighted tube (cystoscopy). TREATMENT Typically, UTIs can be treated with antibiotic medicines.  HOME CARE INSTRUCTIONS   Only take over-the-counter or prescription medicines as directed by your health care provider. If you were prescribed antibiotics, take them as directed. Finish them even if you start to feel better.  Drink enough fluids to keep your urine clear or pale yellow.  Do not have sexual intercourse until the infection is gone and your health care provider says it is okay.  Make sure you are tested for UTIs throughout your pregnancy. These infections often come back. Preventing a UTI in the Future  Practice good toilet habits. Always wipe from front to back. Use the tissue only once.  Do not hold your urine. Empty your bladder as soon as possible when the urge comes.  Do not douche or use deodorant sprays.  Wash with soap and warm water around the genital area and the anus.  Empty your  bladder before and after sexual intercourse.  Wear underwear with a cotton crotch.  Avoid caffeine and carbonated drinks. They can irritate the bladder.  Drink cranberry juice or take cranberry pills. This may decrease the risk of getting a UTI.  Do not drink alcohol.  Keep all your appointments and tests as scheduled. SEEK MEDICAL CARE IF:   Your symptoms get worse.  You are still having fevers 2 or more days after treatment begins.  You have a rash.  You feel that you are having problems with medicines prescribed.  You have abnormal vaginal discharge. SEEK IMMEDIATE MEDICAL CARE IF:   You have back or flank pain.  You have chills.  You have blood in your urine.  You have nausea and vomiting.  You have contractions of your uterus.  You have a gush of fluid from the vagina. MAKE SURE YOU:  Understand these instructions.   Will watch your condition.   Will get help right away if you are not doing well  or get worse.  Document Released: 09/30/2010 Document Revised: 03/26/2013 Document Reviewed: 01/02/2013 [facility] Patient Information 2015 [place], [place]. This information is not intended to replace advice given to you by your health care provider. Make sure you discuss any questions you have with your health care provider.

## 2015-02-26 NOTE — MAU Provider Note (Signed)
MAU HISTORY AND PHYSICAL  Chief Complaint:  Emesis During Pregnancy   [name] is a 29 y.o.  G3P1011 with IUP at [redacted]w[redacted]d presenting for Emesis During Pregnancy  Primary complaint is nausea and vomiting. Going on for a few weeks. Able to keep liquids down, is urinating. Taking diclegis currently. No fevers or chills. Intermittent loose stools intermixed with constipation. No difficulty breathing or cough. No abdominal pain. No dysuria. No contractions, vaginal bleeding, or leakage of fluid.     Past Medical History  Diagnosis Date  . Kidney stones   . Kidney stones   . Kidney stones   . Uterine fibroid   . Migraine     Past Surgical History  Procedure Laterality Date  . Renal artery stent      placed and removed in 2005  . Cesarean section      Nov 2011    Family History  Problem Relation Age of Onset  . Hypertension Mother   . Depression Mother   . Diabetes Father   . Hypertension Father   . Heart disease Father   . Cancer Father     foot    Social History  Substance Use Topics  . Smoking status: Former Research scientist (life sciences)  . Smokeless tobacco: None  . Alcohol Use: No    Allergies  Allergen Reactions  . Erythromycin Base Anaphylaxis  . Ciprofloxacin Nausea And Vomiting  . Latex Other (See Comments)    Causes irritation.  [place] Flavor Other (See Comments)    REACTION: MIGRAINES   . Erythromycin Hives and Rash    Prescriptions prior to admission  Medication Sig Dispense Refill Last Dose  . acetaminophen (TYLENOL) 500 MG tablet Take 1,000 mg by mouth every 6 (six) hours as needed for moderate pain.   02/25/2015 at Unknown time  . diphenhydrAMINE (BENADRYL) 25 MG tablet Take 25 mg by mouth every 6 (six) hours as needed for itching or allergies.   02/25/2015 at Unknown time  . Doxylamine-Pyridoxine (DICLEGIS) 10-10 MG TBEC Take 1-2 tablets by mouth 3 (three) times daily. Take 1 tablet in the morning, take 1 tablet during the afternoon, and then take 2 tablets at  bedtime.   02/26/2015 at Unknown time  . cephALEXin (KEFLEX) 500 MG capsule Take 1 capsule (500 mg total) by mouth 3 (three) times daily. (Patient not taking: Reported on 02/26/2015) 21 capsule 0 Taking  . Miconazole Nitrate (MONISTAT 7 VA) Place 1 application vaginally daily as needed (for infection).   Not Taking at Unknown time  . ondansetron (ZOFRAN ODT) 4 MG disintegrating tablet 4mg  ODT q6 hours prn nausea/vomit (Patient not taking: Reported on 02/26/2015) 4 tablet 0 Not Taking at Unknown time  . Tioconazole (MONISTAT 1 VA) Place 1 application vaginally daily as needed (for infection).   Not Taking at Unknown time    Review of Systems - Negative except for what is mentioned in HPI.  Physical Exam  Blood pressure 111/71, pulse 113, temperature 98.1 F (36.7 C), resp. rate 18, height 5' 0.5" (1.537 m), weight 193 lb (87.544 kg), last menstrual period 10/26/2014. GENERAL: Well-developed, well-nourished female in no acute distress.  LUNGS: Clear to auscultation bilaterally.  HEART: Regular rate and rhythm. ABDOMEN: Soft, nontender, nondistended, gravid.  EXTREMITIES: Nontender, no edema, 2+ distal pulses. Skin: dry mucous membranes FHT:  154    Labs: Results for orders placed or performed during the hospital encounter of 02/26/15 (from the past 24 hour(s))  Urinalysis, Routine w reflex microscopic (not at  [place])   Collection Time: 02/26/15  6:50 PM  Result Value Ref Range   Color, Urine YELLOW YELLOW   APPearance CLEAR CLEAR   Specific Gravity, Urine >1.030 (H) 1.005 - 1.030   pH 6.0 5.0 - 8.0   Glucose, UA NEGATIVE NEGATIVE mg/dL   Hgb urine dipstick TRACE (A) NEGATIVE   Bilirubin Urine NEGATIVE NEGATIVE   Ketones, ur 40 (A) NEGATIVE mg/dL   Protein, ur NEGATIVE NEGATIVE mg/dL   Urobilinogen, UA 0.2 0.0 - 1.0 mg/dL   Nitrite POSITIVE (A) NEGATIVE   Leukocytes, UA NEGATIVE NEGATIVE  Urine microscopic-add on   Collection Time: 02/26/15  6:50 PM  Result Value Ref Range   Squamous  Epithelial / LPF MANY (A) RARE   WBC, UA 3-6 <3 WBC/hpf   RBC / HPF 3-6 <3 RBC/hpf   Bacteria, UA MANY (A) RARE   Urine-Other MUCOUS PRESENT     Imaging Studies:  No results found.    Assessment: [name] is  29 y.o. G3P1011 at [redacted]w[redacted]d presents with Emesis During Pregnancy  Tolerating PO but appears dehydrated, with urine suggestive of such. Currently taking diclegis, so many more potent medications available.  FHTs normal, which is reassuring. No abdominal pain to suggest biliary or pancreatic source. Had normal first tri u/s, so little/no concern for multiple gestation or gestational trophoblastic disease. Urinalysis is suggestive of possible asymptomatic UTI.   Plan: - zofran - 1 L NS - CMP - treat for presumptive UTI - likely home with scripts for zofran and phenergan, with instructions to try phenergan, then zofran if phenergan not successful, but not to take both at same time until OB f/u  21:00 care transferred to [place] 9/9/20168:26 PM

## 2015-02-28 LAB — CULTURE, OB URINE: Culture: >=100000

## 2015-03-04 ENCOUNTER — Ambulatory Visit (HOSPITAL_COMMUNITY)
Admission: RE | Admit: 2015-03-04 | Discharge: 2015-03-04 | Disposition: A | Discharge status: Home / Self Care | Payer: Self-pay | Source: Ambulatory Visit | Attending: Obstetrics & Gynecology | Admitting: Obstetrics & Gynecology

## 2015-03-04 ENCOUNTER — Other Ambulatory Visit: Payer: Self-pay | Admitting: Obstetrics & Gynecology

## 2015-03-04 DIAGNOSIS — O3421 Maternal care for scar from previous cesarean delivery: Secondary | ICD-10-CM | POA: Insufficient documentation

## 2015-03-04 DIAGNOSIS — Z1389 Encounter for screening for other disorder: Secondary | ICD-10-CM

## 2015-03-04 DIAGNOSIS — Z3A18 18 weeks gestation of pregnancy: Secondary | ICD-10-CM | POA: Insufficient documentation

## 2015-03-04 DIAGNOSIS — Z36 Encounter for antenatal screening of mother: Secondary | ICD-10-CM | POA: Insufficient documentation

## 2015-03-04 DIAGNOSIS — O34219 Maternal care for unspecified type scar from previous cesarean delivery: Secondary | ICD-10-CM

## 2015-03-09 ENCOUNTER — Other Ambulatory Visit (INDEPENDENT_AMBULATORY_CARE_PROVIDER_SITE_OTHER): Payer: Self-pay | Admitting: *Deleted

## 2015-03-09 DIAGNOSIS — R300 Dysuria: Secondary | ICD-10-CM

## 2015-03-09 LAB — POCT URINALYSIS DIPSTICK
BILIRUBIN UA: NEGATIVE
GLUCOSE UA: NEGATIVE
Ketones, UA: NEGATIVE
LEUKOCYTES UA: NEGATIVE
NITRITE UA: POSITIVE
Protein, UA: NEGATIVE
Spec Grav, UA: 1.025
Urobilinogen, UA: NEGATIVE
pH, UA: 6

## 2015-03-09 NOTE — Progress Notes (Signed)
Patient here today to drop off a urine specimen.

## 2015-03-12 LAB — URINE CULTURE: Colony Count: >=100000

## 2015-03-16 ENCOUNTER — Telehealth: Payer: Self-pay | Admitting: *Deleted

## 2015-03-16 DIAGNOSIS — N390 Urinary tract infection, site not specified: Secondary | ICD-10-CM

## 2015-03-16 MED ORDER — CEPHALEXIN 500 MG PO CAPS: 500.0000 mg | ORAL_CAPSULE | Freq: Four times a day (QID) | ORAL | Status: DC

## 2015-03-16 NOTE — Telephone Encounter (Signed)
Patient returned call and I informed patient that her urine culture was positive for a UTI.  Per Dr. Kennon Rounds, I sent in [place] to her pharmacy.  Patient aware.

## 2015-03-17 ENCOUNTER — Encounter: Payer: Self-pay | Admitting: Obstetrics & Gynecology

## 2015-03-24 ENCOUNTER — Ambulatory Visit (INDEPENDENT_AMBULATORY_CARE_PROVIDER_SITE_OTHER): Payer: Self-pay | Admitting: Obstetrics & Gynecology

## 2015-03-24 VITALS — BP 102/70 | HR 82 | Wt 201.0 lb

## 2015-03-24 DIAGNOSIS — O99891 Other specified diseases and conditions complicating pregnancy: Secondary | ICD-10-CM

## 2015-03-24 DIAGNOSIS — Z36 Encounter for antenatal screening of mother: Secondary | ICD-10-CM

## 2015-03-24 DIAGNOSIS — O9989 Other specified diseases and conditions complicating pregnancy, childbirth and the puerperium: Secondary | ICD-10-CM

## 2015-03-24 DIAGNOSIS — IMO0002 Reserved for concepts with insufficient information to code with codable children: Secondary | ICD-10-CM

## 2015-03-24 DIAGNOSIS — O26892 Other specified pregnancy related conditions, second trimester: Secondary | ICD-10-CM

## 2015-03-24 DIAGNOSIS — Z3492 Encounter for supervision of normal pregnancy, unspecified, second trimester: Secondary | ICD-10-CM

## 2015-03-24 DIAGNOSIS — M549 Dorsalgia, unspecified: Secondary | ICD-10-CM

## 2015-03-24 DIAGNOSIS — Z3482 Encounter for supervision of other normal pregnancy, second trimester: Secondary | ICD-10-CM

## 2015-03-24 DIAGNOSIS — Z0489 Encounter for examination and observation for other specified reasons: Secondary | ICD-10-CM

## 2015-03-24 NOTE — Patient Instructions (Signed)
Return to clinic for any obstetric concerns or go to MAU for evaluation  

## 2015-03-24 NOTE — Progress Notes (Signed)
Subjective:  [name] is a 29 y.o. G3P1011 at [redacted]w[redacted]d being seen today for ongoing prenatal care.  Patient reports occasional lower back spasms with movement. No fevers.  Contractions: Irregular.  Vag. Bleeding: None. Movement: Present. Denies leaking of fluid.   The following portions of the patient's history were reviewed and updated as appropriate: allergies, current medications, past family history, past medical history, past social history, past surgical history and problem list.   Objective:   Filed Vitals:   03/24/15 1057  BP: 102/70  Pulse: 82  Weight: 201 lb (91.173 kg)    Fetal Status: Fetal Heart Rate (bpm): 152 Fundal Height: 21 cm Movement: Present     General:  Alert, oriented and cooperative. Patient is in no acute distress.  Skin: Skin is warm and dry. No rash noted.   Cardiovascular: Normal heart rate noted  Respiratory: Normal respiratory effort, no problems with respiration noted  Abdomen: Soft, gravid, appropriate for gestational age. Pain/Pressure: Absent     Pelvic: Vag. Bleeding: None Vag D/C Character: Thin   Cervical exam deferred        Extremities: Normal range of motion.  Edema: None  Mental Status: Normal mood and affect. Normal behavior. Normal judgment and thought content.   Urinalysis: Urine Protein: Negative Urine Glucose: Negative  Assessment and Plan:  Pregnancy: G3P1011 at [redacted]w[redacted]d  1. Supervision of normal pregnancy, second trimester Normal first screen, inadequate anatomy scan - Alpha fetoprotein, maternal - [place] [facility] OB FOLLOW UP; Future  2. Evaluate anatomy not seen on prior sonogram - [place] [facility] OB FOLLOW UP; Future ordered, to be done around 24 weeks  3. Back pain affecting pregnancy in second trimester Recommended Tylenol as needed.  Recommended Flexeril, patient declined for now. She will reevaluate if symptoms get worse  Emphasized importance of adherence to BABYSCRIPTS.  If this is not done, she will be pulled from program and  continue routine prenatal care. Preterm labor symptoms and general obstetric precautions including but not limited to vaginal bleeding, contractions, leaking of fluid and fetal movement were reviewed in detail with the patient. Please refer to After Visit Summary for other counseling recommendations.  Return in about 8 weeks (around 05/19/2015) for 1 hr GTT, 3rd trimester labs, TDap, OB Visit (BABYSCRIPTS).   Osborne Oman, MD

## 2015-03-24 NOTE — Progress Notes (Signed)
Unable to send AFP due to difficultly in drawing blood, attempted x 3 times with no success.

## 2015-03-24 NOTE — Progress Notes (Signed)
Pt reports occasional braxton hicks contractions. Has not treated UTI, pt states she will pick medication up this Friday at the pharmacy after her husband gets paid.  Also spoke to pt about documenting BP for [employer], pt said phone was not working for a few weeks but is fixed at this time so she will start this week.  Informed pt if she continues to miss BP readings that we will have to remove her from the program. Pt acknowledged understanding.

## 2015-04-01 ENCOUNTER — Ambulatory Visit (HOSPITAL_COMMUNITY): Payer: Self-pay

## 2015-04-15 ENCOUNTER — Ambulatory Visit (HOSPITAL_COMMUNITY)
Admission: RE | Admit: 2015-04-15 | Discharge: 2015-04-15 | Disposition: A | Discharge status: Home / Self Care | Payer: Medicaid Other | Source: Ambulatory Visit | Attending: Obstetrics & Gynecology | Admitting: Obstetrics & Gynecology

## 2015-04-15 DIAGNOSIS — Z3492 Encounter for supervision of normal pregnancy, unspecified, second trimester: Secondary | ICD-10-CM | POA: Insufficient documentation

## 2015-04-15 DIAGNOSIS — Z36 Encounter for antenatal screening of mother: Secondary | ICD-10-CM | POA: Insufficient documentation

## 2015-04-15 DIAGNOSIS — IMO0002 Reserved for concepts with insufficient information to code with codable children: Secondary | ICD-10-CM

## 2015-04-15 DIAGNOSIS — Z0489 Encounter for examination and observation for other specified reasons: Secondary | ICD-10-CM

## 2015-05-11 ENCOUNTER — Inpatient Hospital Stay (HOSPITAL_COMMUNITY)
Admission: AD | Admit: 2015-05-11 | Discharge: 2015-05-11 | Disposition: A | Discharge status: Home / Self Care | Payer: Medicaid Other | Source: Ambulatory Visit | Attending: Obstetrics & Gynecology | Admitting: Obstetrics & Gynecology

## 2015-05-11 ENCOUNTER — Encounter (HOSPITAL_COMMUNITY): Payer: Self-pay

## 2015-05-11 DIAGNOSIS — O2342 Unspecified infection of urinary tract in pregnancy, second trimester: Secondary | ICD-10-CM | POA: Insufficient documentation

## 2015-05-11 DIAGNOSIS — Z3A28 28 weeks gestation of pregnancy: Secondary | ICD-10-CM | POA: Diagnosis not present

## 2015-05-11 DIAGNOSIS — O23593 Infection of other part of genital tract in pregnancy, third trimester: Secondary | ICD-10-CM | POA: Diagnosis not present

## 2015-05-11 DIAGNOSIS — O2343 Unspecified infection of urinary tract in pregnancy, third trimester: Secondary | ICD-10-CM

## 2015-05-11 DIAGNOSIS — Z87891 Personal history of nicotine dependence: Secondary | ICD-10-CM | POA: Insufficient documentation

## 2015-05-11 DIAGNOSIS — A5901 Trichomonal vulvovaginitis: Secondary | ICD-10-CM | POA: Diagnosis not present

## 2015-05-11 DIAGNOSIS — Z881 Allergy status to other antibiotic agents status: Secondary | ICD-10-CM | POA: Insufficient documentation

## 2015-05-11 DIAGNOSIS — O98312 Other infections with a predominantly sexual mode of transmission complicating pregnancy, second trimester: Secondary | ICD-10-CM | POA: Diagnosis not present

## 2015-05-11 DIAGNOSIS — R1030 Lower abdominal pain, unspecified: Secondary | ICD-10-CM | POA: Diagnosis present

## 2015-05-11 LAB — URINE MICROSCOPIC-ADD ON

## 2015-05-11 LAB — URINALYSIS, ROUTINE W REFLEX MICROSCOPIC
BILIRUBIN URINE: NEGATIVE
Glucose, UA: 100 mg/dL — AB
KETONES UR: 15 mg/dL — AB
Nitrite: POSITIVE — AB
PH: 6.5 (ref 5.0–8.0)
Protein, ur: NEGATIVE mg/dL

## 2015-05-11 LAB — WET PREP, GENITAL
Clue Cells Wet Prep HPF POC: NONE SEEN
SPERM: NONE SEEN
YEAST WET PREP: NONE SEEN

## 2015-05-11 MED ORDER — NITROFURANTOIN MONOHYD MACRO 100 MG PO CAPS: 100.0000 mg | ORAL_CAPSULE | Freq: Two times a day (BID) | ORAL | Status: DC

## 2015-05-11 MED ORDER — METRONIDAZOLE 500 MG PO TABS
2000.0000 mg | ORAL_TABLET | Freq: Once | ORAL | Status: AC
  Administered 2015-05-11: 2000 mg via ORAL
  Filled 2015-05-11: qty 4

## 2015-05-11 NOTE — Discharge Instructions (Signed)
Expedited Partner Therapy:  °Information Sheet for Patients and Partners  °            ° °You have been offered expedited partner therapy (EPT). This information sheet contains important information and warnings you need to be aware of, so please read it carefully.  ° °Expedited Partner Therapy (EPT) is the clinical practice of treating the sexual partners of persons who receive chlamydia, gonorrhea, or trichomoniasis diagnoses by providing medications or prescriptions to the patient. Patients then provide partners with these therapies without the health-care provider having examined the partner. In other words, EPT is a convenient, fast and private way for patients to help their sexual partners get treated.  ° °Chlamydia and gonorrhea are bacterial infections you get from having sex with a person who is already infected. Trichomoniasis (or “trich”) is a very common sexually transmitted infection (STI) that is caused by infection with a protozoan parasite called Trichomonas vaginalis.  Many people with these infections don’t know it because they feel fine, but without treatment these infections can cause serious health problems, such as pelvic inflammatory disease, ectopic pregnancy, infertility and increased risk of HIV.  ° °It is important to get treated as soon as possible to protect your health, to avoid spreading these infections to others, and to prevent yourself from becoming re-infected. The good news is these infections can be easily cured with proper antibiotic medicine. The best way to take care of your self is to see a doctor or go to your local health department. If you are not able to see a doctor or other medical provider, you should take EPT.  ° ° °Recommended Medication: °EPT for Chlamydia:  Azithromycin (Zithromax) 1 gram orally in a single dose °EPT for Gonorrhea:  Cefixime (Suprax) 400 milligrams orally in a single dose PLUS azithromycin (Zithromax) 1 gram orally in a single dose °EPT for  Trichomoniasis:  Metronidazole (Flagyl) 2 grams orally in a single dose ° ° °These medicines are very safe. However, you should not take them if you have ever had an allergic reaction (like a rash) to any of these medicines: azithromycin (Zithromax), erythromycin, clarithromycin (Biaxin), metronidazole (Flagyl), tinidazole (Tindimax). If you are uncertain about whether you have an allergy, call your medical provider or pharmacist before taking this medicine. If you have a serious, long-term illness like kidney, liver or heart disease, colitis or stomach problems, or you are currently taking other prescription medication, talk to your provider before taking this medication.  ° °Women: If you have lower belly pain, pain during sex, vomiting, or a fever, do not take this medicine. Instead, you should see a medical provider to be certain you do not have pelvic inflammatory disease (PID). PID can be serious and lead to infertility, pregnancy problems or chronic pelvic pain.  ° °Pregnant Women: It is very important for you to see a doctor to get pregnancy services and pre-natal care. These antibiotics for EPT are safe for pregnant women, but you still need to see a medical provider as soon as possible. It is also important to note that Doxycycline is an alternative therapy for chlamydia, but it should not be taken by someone who is pregnant.  ° °Men: If you have pain or swelling in the testicles or a fever, do not take this medicine and see a medical provider.    ° °Men who have sex with men (MSM): MSM in [place] continue to experience high rates of syphilis and HIV. Many MSM with gonorrhea or   chlamydia could also have syphilis and/or HIV and not know it. If you are a man who has sex with other men, it is very important that you see a medical provider and are tested for HIV and syphilis. EPT is not recommended for gonorrhea for MSM.  Recommended treatment for gonorrhea for MSM is Rocephin (shot) AND azithromycin  due to decreased cure rate.  Please see your medical provider if this is the case.    Along with this information sheet is a prescription for the medicine. If you receive a prescription it will be in your name and will indicate your date of birth, or it will be in the name of Expedited Partner Therapy.   In either case, you can have the prescription filled at a pharmacy. You will be responsible for the cost of the medicine, unless you have prescription drug coverage. In that case, you could provide your name so the pharmacy could bill your health plan.   Take the medication as directed. Some people will have a mild, upset stomach, which does not last long. AVOID alcohol 24 hours after taking metronidazole (Flagyl) to reduce the possibility of a disulfiram-like reaction (severe vomiting and abdominal pain).  After taking the medicine, do not have sex for 7 days. Do not share this medicine or give it to anyone else. It is important to tell everyone you have had sex with in the last 60 days that they need to go and get tested for sexually transmitted infections.   Ways to prevent these and other sexually transmitted infections (STIs):    Abstain from sex. This is the only sure way to avoid getting an STI.   Use barrier methods, such as condoms, consistently and correctly.   Limit the number of sexual partners.   Have regular physical exams, including testing for STIs.   For more information about EPT or other issues pertaining to an STI, please contact your medical provider or the [facility] Department at [phone] or [email or web address]   Asymptomatic Bacteriuria, Female Asymptomatic bacteriuria is the presence of a large number of bacteria in your urine without the usual symptoms of burning or frequent urination. The following conditions increase the risk of asymptomatic bacteriuria:  Diabetes  mellitus.  Advanced age.  Pregnancy in the first trimester.  Kidney stones.  Kidney transplants.  Leaky kidney tube valve in young children (reflux). Treatment for this condition is not needed in most people and can lead to other problems such as too much yeast and growth of resistant bacteria. However, some people, such as pregnant women, do need treatment to prevent kidney infection. Asymptomatic bacteriuria in pregnancy is also associated with fetal growth restriction, premature labor, and newborn death. HOME CARE INSTRUCTIONS Monitor your condition for any changes. The following actions may help to relieve any discomfort you are feeling:  Drink enough water and fluids to keep your urine clear or pale yellow. Go to the bathroom more often to keep your bladder empty.  Keep the area around your vagina and rectum clean. Wipe yourself from front to back after urinating. SEEK IMMEDIATE MEDICAL CARE IF:  You develop signs of an infection such as:  Burning with urination.  Frequency of voiding.  Back pain.  Fever.  You have blood in the urine.  You develop a fever. MAKE SURE YOU:  Understand these instructions.  Will watch your condition.  Will get help right away if you are not doing well or get worse.   This  information is not intended to replace advice given to you by your health care provider. Make sure you discuss any questions you have with your health care provider.   Document Released: 06/05/2005 Document Revised: 06/26/2014 Document Reviewed: 11/25/2012 Elsevier Interactive Patient Education 2016 [employer]. Trichomoniasis Trichomoniasis is an infection caused by an organism called Trichomonas. The infection can affect both women and men. In women, the outer female genitalia and the vagina are affected. In men, the penis is mainly affected, but the prostate and other reproductive organs can also be involved. Trichomoniasis is a sexually transmitted infection  (STI) and is most often passed to another person through sexual contact.  RISK FACTORS  Having unprotected sexual intercourse.  Having sexual intercourse with an infected partner. SIGNS AND SYMPTOMS  Symptoms of trichomoniasis in women include:  Abnormal gray-green frothy vaginal discharge.  Itching and irritation of the vagina.  Itching and irritation of the area outside the vagina. Symptoms of trichomoniasis in men include:   Penile discharge with or without pain.  Pain during urination. This results from inflammation of the urethra. DIAGNOSIS  Trichomoniasis may be found during a Pap test or physical exam. Your health care provider may use one of the following methods to help diagnose this infection:  Testing the pH of the vagina with a test tape.  Using a vaginal swab test that checks for the Trichomonas organism. A test is available that provides results within a few minutes.  Examining a urine sample.  Testing vaginal secretions. Your health care provider may test you for other STIs, including HIV. TREATMENT   You may be given medicine to fight the infection. Women should inform their health care provider if they could be or are pregnant. Some medicines used to treat the infection should not be taken during pregnancy.  Your health care provider may recommend over-the-counter medicines or creams to decrease itching or irritation.  Your sexual partner will need to be treated if infected.  Your health care provider may test you for infection again 3 months after treatment. HOME CARE INSTRUCTIONS   Take medicines only as directed by your health care provider.  Take over-the-counter medicine for itching or irritation as directed by your health care provider.  Do not have sexual intercourse while you have the infection.  Women should not douche or wear tampons while they have the infection.  Discuss your infection with your partner. Your partner may have gotten the  infection from you, or you may have gotten it from your partner.  Have your sex partner get examined and treated if necessary.  Practice safe, informed, and protected sex.  See your health care provider for other STI testing. SEEK MEDICAL CARE IF:   You still have symptoms after you finish your medicine.  You develop abdominal pain.  You have pain when you urinate.  You have bleeding after sexual intercourse.  You develop a rash.  Your medicine makes you sick or makes you throw up (vomit). MAKE SURE YOU:  Understand these instructions.  Will watch your condition.  Will get help right away if you are not doing well or get worse.   This information is not intended to replace advice given to you by your health care provider. Make sure you discuss any questions you have with your health care provider.   Document Released: 11/29/2000 Document Revised: 06/26/2014 Document Reviewed: 03/17/2013 Elsevier Interactive Patient Education [employer].

## 2015-05-11 NOTE — MAU Note (Signed)
Pt reports she feels like the baby has dropped and a constant pulling in bilateral lower abd. Also reports ?leaking fluid today.

## 2015-05-11 NOTE — MAU Provider Note (Signed)
History   [name] is a G3P1011 at [redacted]w[redacted]d with no significant pregnancy related complications who presents to the MAU tonight with concerns for "the baby dropping" and lower abdominal pain that started this evening. She reports that over the past week she has experienced some pain in her inguinal area making it difficult to walk. The pain was exacerbated by working but eventually became better after using a maternity belt to help support her uterus. She noted this morning that she was able to see her feet this morning for the first time in the past month. Around an hour before she presented to the MAU she reported intermittent cramping lower abdominal pain. In addition she reported some small amount of clear vaginal discharge and did not believe it was ROM. She was concerned that since the baby had dropped and now she was having abdominal pain that she might be going into labor. She reported +FM and contractions. She denied LOF, vaginal bleeding, headache, RUQ pain, vision changes, or swelling of hands and feet. She denied increased urinary frequency, dysuria and fever.  CSN: [id number]  Arrival date and time: 05/11/15 2046   None     No chief complaint on file.  HPI  OB History    Gravida Para Term Preterm AB TAB SAB Ectopic Multiple Living   3 1 1  1  1   1       Past Medical History  Diagnosis Date  . Kidney stones   . Kidney stones   . Kidney stones   . Uterine fibroid   . Migraine     Past Surgical History  Procedure Laterality Date  . Renal artery stent      placed and removed in 2005  . Cesarean section      Nov 2011    Family History  Problem Relation Age of Onset  . Hypertension Mother   . Depression Mother   . Diabetes Father   . Hypertension Father   . Heart disease Father   . Cancer Father     foot    Social History  Substance Use Topics  . Smoking status: Former Research scientist (life sciences)  . Smokeless tobacco: Not on file  . Alcohol Use: No    Allergies:   Allergies  Allergen Reactions  . Erythromycin Base Anaphylaxis  . Ciprofloxacin Nausea And Vomiting  . Latex Other (See Comments)    Causes irritation.  C[place] Flavor Other (See Comments)    REACTION: MIGRAINES   . Erythromycin Hives and Rash    Prescriptions prior to admission  Medication Sig Dispense Refill Last Dose  . acetaminophen (TYLENOL) 500 MG tablet Take 1,000 mg by mouth every 6 (six) hours as needed for moderate pain.   Not Taking  . cephALEXin (KEFLEX) 500 MG capsule Take 1 capsule (500 mg total) by mouth 4 (four) times daily. (Patient not taking: Reported on 03/24/2015) 28 capsule 0 Not Taking  . diphenhydrAMINE (BENADRYL) 25 MG tablet Take 25 mg by mouth every 6 (six) hours as needed for itching or allergies.   Not Taking  . promethazine (PHENERGAN) 25 MG tablet Take 1 tablet (25 mg total) by mouth every 6 (six) hours as needed for nausea or vomiting. 60 tablet 2 Taking    Review of Systems  All other systems reviewed and are negative.  Physical Exam   Blood pressure 123/78, pulse 85, temperature 98.1 F (36.7 C), temperature source Oral, resp. rate 18, height 5' 0.5" (1.537 m), weight 92.987 kg (205  lb), last menstrual period 10/26/2014, SpO2 98 %.  Physical Exam  Constitutional: She appears well-developed.  Cardiovascular: Normal rate and regular rhythm.   No murmur heard. Respiratory: Effort normal and breath sounds normal. She has no rales.  GI: Bowel sounds are normal.  EFM 130-140s, +accels, no decels No ctx  Genitourinary:  Cervix closed  Musculoskeletal: She exhibits no edema.  Neurological: She displays normal reflexes.   Urinalysis    Component Value Date/Time   COLORURINE YELLOW 05/11/2015 2055   APPEARANCEUR CLEAR 05/11/2015 2055   LABSPEC >1.030* 05/11/2015 2055   PHURINE 6.5 05/11/2015 2055   GLUCOSEU 100* 05/11/2015 2055   HGBUR MODERATE* 05/11/2015 2055   HGBUR negative 11/28/2009 1336   BILIRUBINUR NEGATIVE 05/11/2015 2055    BILIRUBINUR NEG 03/09/2015 1627   KETONESUR 15* 05/11/2015 2055   PROTEINUR NEGATIVE 05/11/2015 2055   PROTEINUR NEG 03/09/2015 1627   UROBILINOGEN negative 03/09/2015 1627   UROBILINOGEN 0.2 02/26/2015 1850   NITRITE POSITIVE* 05/11/2015 2055   NITRITE POSITIVE 03/09/2015 1627   LEUKOCYTESUR SMALL* 05/11/2015 2055   Urine microscopic add on (2055): Positive for Trichomonas Wet Prep: Positive for Trichomonas and moderate WBC Urine OB Culture: in process   MAU Course  Procedures  2055: Patient presented to MAU. Urinalysis, urine microscopic add on and urine culture collected 2143: Wet prep collected  2215: Urinalysis results consistent with UTI; wet prep positive for trichomonas   Assessment and Plan  [name] is a 29yo G3P1011 at 28wk 1d who presented with concerns for "the baby dropped" and lower abdominal pain. Based on her lab results, trichomonas and UTI are the likely sources of her abdominal pain and vaginal discharge that started this evening. FHM and cervical exam were reassuring making it is less likely that her pain is concerning for labor. Recommend following up at next prenatal visit for concerns about ongoing abdominal pain. At this point she will be treated for Trichomonas and UTI. She reported that this was her fourth UTI this pregnancy so would consider prophylaxis during remainder of pregnancy.  #Trichmonas: Metronidazole 2g PO x1 dose; also partner given EPT of Flagyl 2 gm PO w/ instructions rev'd as printed on discharge instructions #UTI: Start Macrobid 100mg  BID x 10 days, followed by suppressive therapy Macrobid 100mg  qd #F/U at next prenatal visit on 11/30; culture results pending for sensitivity, will change antibiotic if indicated # Abdominal pain: acetaminophen PRN   Serita Grammes CNM 05/11/2015, 10:15 PM

## 2015-05-14 LAB — CULTURE, OB URINE: Culture: >=100000

## 2015-05-19 ENCOUNTER — Encounter: Payer: Medicaid Other | Admitting: Obstetrics & Gynecology

## 2015-05-19 ENCOUNTER — Other Ambulatory Visit: Payer: Medicaid Other

## 2015-05-19 ENCOUNTER — Ambulatory Visit (INDEPENDENT_AMBULATORY_CARE_PROVIDER_SITE_OTHER): Payer: Medicaid Other | Admitting: Certified Nurse Midwife

## 2015-05-19 VITALS — BP 108/78 | HR 97 | Wt 205.0 lb

## 2015-05-19 DIAGNOSIS — Z23 Encounter for immunization: Secondary | ICD-10-CM

## 2015-05-19 DIAGNOSIS — O34219 Maternal care for unspecified type scar from previous cesarean delivery: Secondary | ICD-10-CM

## 2015-05-19 DIAGNOSIS — Z3403 Encounter for supervision of normal first pregnancy, third trimester: Secondary | ICD-10-CM

## 2015-05-19 LAB — CBC
HEMATOCRIT: 30.3 % — AB (ref 36.0–46.0)
Hemoglobin: 9.8 g/dL — ABNORMAL LOW (ref 12.0–15.0)
MCH: 26 pg (ref 26.0–34.0)
MCHC: 32.3 g/dL (ref 30.0–36.0)
MCV: 80.4 fL (ref 78.0–100.0)
MPV: 8.6 fL (ref 8.6–12.4)
Platelets: 226 10*3/uL (ref 150–400)
RBC: 3.77 MIL/uL — ABNORMAL LOW (ref 3.87–5.11)
RDW: 16 % — AB (ref 11.5–15.5)
WBC: 8.6 10*3/uL (ref 4.0–10.5)

## 2015-05-19 NOTE — Progress Notes (Signed)
Subjective:  [name] is a 29 y.o. G3P1011 at [redacted]w[redacted]d being seen today for ongoing prenatal care.  She is currently monitored for the following issues for this low-risk pregnancy and has Supervision of normal pregnancy; Previous cesarean delivery affecting pregnancy, antepartum; and Vaginal bleeding during pregnancy, antepartum on her problem list.  Patient reports no complaints.  Contractions: Irregular. Vag. Bleeding: None.  Movement: Present. Denies leaking of fluid.   The following portions of the patient's history were reviewed and updated as appropriate: allergies, current medications, past family history, past medical history, past social history, past surgical history and problem list. Problem list updated.  Objective:   Filed Vitals:   05/19/15 1318  BP: 108/78  Pulse: 97  Weight: 205 lb (92.987 kg)    Fetal Status: Fetal Heart Rate (bpm): 151 Fundal Height: 27 cm Movement: Present     General:  Alert, oriented and cooperative. Patient is in no acute distress.  Skin: Skin is warm and dry. No rash noted.   Cardiovascular: Normal heart rate noted  Respiratory: Normal respiratory effort, no problems with respiration noted  Abdomen: Soft, gravid, appropriate for gestational age. Pain/Pressure: Absent     Pelvic: Vag. Bleeding: None Vag D/C Character: Thin   Cervical exam deferred        Extremities: Normal range of motion.  Edema: Trace  Mental Status: Normal mood and affect. Normal behavior. Normal judgment and thought content.   Urinalysis: Urine Protein: Trace Urine Glucose: Negative  Assessment and Plan:  Pregnancy: G3P1011 at [redacted]w[redacted]d  1. Immunization due  - Tdap vaccine greater than or equal to 7yo IM  2. Supervision of normal first pregnancy in third trimester  - Tdap vaccine greater than or equal to 7yo IM  Preterm labor symptoms and general obstetric precautions including but not limited to vaginal bleeding, contractions, leaking of fluid and fetal movement  were reviewed in detail with the patient. Please refer to After Visit Summary for other counseling recommendations.  Return in about 2 weeks (around 06/02/2015).   Larey Days, CNM

## 2015-05-19 NOTE — Patient Instructions (Signed)
Glucose Tolerance Test The glucose tolerance test (GTT) is one of several tests used to diagnose diabetes mellitus. The GTT is a blood test, and it may include a urine test as well. The GTT checks to see how your body processes sugar (glucose). For this test, you will consume a drink containing a high level of glucose. Your blood glucose levels will be checked before you consume the drink and then again 1, 2, 3, and possibly 4 hours after you consume it. Your health care provider may recommend that you have the GTT if you:  Have a family history of diabetes.   Are very overweight (obese).   Have experienced infections that keep coming back.   Have had numerous cuts or wounds that did not heal quickly, especially on your legs and feet.   Are a woman and have a history of giving birth to very large babies or a history of repeated fetal loss (stillbirth).  Have had glucose in your urine or high blood sugar:   During pregnancy.   After a heart attack, surgery, or prolonged periods of high stress.  The GTT lasts 3-4 hours. Other than the glucose solution, you will not be allowed to eat or drink anything during the test. You must remain at the testing location to make sure that your blood and urine samples are taken on time. PREPARATION FOR TEST Eat normally for 3 days prior to the GTT test, including having plenty of carbohydrate-rich foods. Do not eat or drink anything except water during the final 12 hours before the test. You should not smoke or exercise during the test. In addition, your health care provider may ask you to stop taking certain medicines before the test. RESULTS It is your responsibility to obtain your test results. Ask the lab or department performing the test when and how you will get your results. Contact your health care provider to discuss any questions you have about your results. Range of Normal Values Ranges for normal values may vary among different labs and  hospitals. You should always check with your health care provider after having lab work or other tests done to discuss whether your values are considered within normal limits.  Normal levels of blood glucose are as follows:  Fasting: less than 110 mg/dL or less than 6.1 mmol/L (SI units).  1 hour after consuming the glucose drink: less than 200 mg/dL or less than 11.1 mmol/L.  2 hours after consuming the glucose drink: less than 140 mg/dL or less than 7.8 mmol/L.  3 hours after consuming the glucose drink: 70-115 mg/dL or less than 6.4 mmol/L.  4 hours after consuming the glucose drink: 70-115 mg/dL or less than 6.4 mmol/L. The normal result for the urine test is negative, meaning that glucose is absent from your urine. Some substances can interfere with GTT results. These may include:  Blood pressure and heart failure medicines, including beta blockers, furosemide, and thiazides.   Anti-inflammatory medicines, including aspirin.   Nicotine.   Some psychiatric medicines.   Oral contraceptives.   Diuretics or corticosteroids. Meaning of Results Outside Normal Value Ranges GTT test results that are above normal values may indicate health problems, such as:  Diabetes mellitus.   Acute stress response.   Cushing syndrome.   Tumors such as pheochromocytoma or glucagonoma.   Chronic renal failure.   Pancreatitis.   Hyperthyroidism.   Current infection.  Discuss your test results with your health care provider. He or she will use the results   to make a diagnosis and determine a treatment plan that is right for you.   This information is not intended to replace advice given to you by your health care provider. Make sure you discuss any questions you have with your health care provider.   Document Released: 06/28/2004 Document Revised: 06/26/2014 Document Reviewed: 10/10/2013 Elsevier Interactive Patient Education 2016 [employer]  

## 2015-05-20 LAB — GLUCOSE TOLERANCE, 1 HOUR (50G) W/O FASTING: GLUCOSE 1 HOUR GTT: 137 mg/dL (ref 70–140)

## 2015-05-20 LAB — HIV ANTIBODY (ROUTINE TESTING W REFLEX): HIV: NONREACTIVE

## 2015-05-20 LAB — RPR

## 2015-05-22 ENCOUNTER — Inpatient Hospital Stay (HOSPITAL_COMMUNITY)
Admission: AD | Admit: 2015-05-22 | Discharge: 2015-05-22 | Disposition: A | Discharge status: Home / Self Care | Payer: Medicaid Other | Source: Ambulatory Visit | Attending: Obstetrics & Gynecology | Admitting: Obstetrics & Gynecology

## 2015-05-22 ENCOUNTER — Encounter (HOSPITAL_COMMUNITY): Payer: Self-pay | Admitting: *Deleted

## 2015-05-22 DIAGNOSIS — O23599 Infection of other part of genital tract in pregnancy, unspecified trimester: Secondary | ICD-10-CM

## 2015-05-22 DIAGNOSIS — O2343 Unspecified infection of urinary tract in pregnancy, third trimester: Secondary | ICD-10-CM | POA: Diagnosis not present

## 2015-05-22 DIAGNOSIS — O34219 Maternal care for unspecified type scar from previous cesarean delivery: Secondary | ICD-10-CM

## 2015-05-22 DIAGNOSIS — Z3A29 29 weeks gestation of pregnancy: Secondary | ICD-10-CM | POA: Insufficient documentation

## 2015-05-22 DIAGNOSIS — Z87891 Personal history of nicotine dependence: Secondary | ICD-10-CM | POA: Insufficient documentation

## 2015-05-22 DIAGNOSIS — O4703 False labor before 37 completed weeks of gestation, third trimester: Secondary | ICD-10-CM

## 2015-05-22 DIAGNOSIS — Z881 Allergy status to other antibiotic agents status: Secondary | ICD-10-CM | POA: Diagnosis not present

## 2015-05-22 DIAGNOSIS — A5901 Trichomonal vulvovaginitis: Secondary | ICD-10-CM | POA: Diagnosis not present

## 2015-05-22 DIAGNOSIS — O23593 Infection of other part of genital tract in pregnancy, third trimester: Secondary | ICD-10-CM | POA: Diagnosis not present

## 2015-05-22 DIAGNOSIS — O98313 Other infections with a predominantly sexual mode of transmission complicating pregnancy, third trimester: Secondary | ICD-10-CM | POA: Insufficient documentation

## 2015-05-22 LAB — URINE MICROSCOPIC-ADD ON

## 2015-05-22 LAB — URINALYSIS, ROUTINE W REFLEX MICROSCOPIC
Bilirubin Urine: NEGATIVE
Glucose, UA: NEGATIVE mg/dL
Hgb urine dipstick: NEGATIVE
Ketones, ur: >80 mg/dL — AB
Nitrite: NEGATIVE
Protein, ur: NEGATIVE mg/dL
Specific Gravity, Urine: 1.025 (ref 1.005–1.030)
pH: 6 (ref 5.0–8.0)

## 2015-05-22 NOTE — MAU Note (Signed)
Patient presents at [redacted] weeks gestation with c/o contractions since 0900 today. Fetus active. Denies bleeding or discharge. States she had intercourse this morning at 0500.

## 2015-05-22 NOTE — Discharge Instructions (Signed)

## 2015-05-22 NOTE — MAU Provider Note (Signed)
History     CSN: [id number]  Arrival date and time: 05/22/15 1124   First Provider Initiated Contact with Patient 05/22/15 1215      Chief Complaint  Patient presents with  . Contractions   HPI Patient is 29 y.o. [id number] [redacted]w[redacted]d here with complaints of contractions and losing mucous plug. The patient had sex at 0500 AM today and reports cramping/contractions started after that and additionally she noted mucous discharge. She was seen in the MAU on 11/22 and diagnosed with both an Ecoli UTI and Trich. She was given flagyl in the MAU and sent home on Macrobid which her isolate was sensitive to. Additionally she was started on macrobid for prophylaxsis.   Reports contractions started at 0930 and were intense but now much improved.   +FM, denies LOF, VB, vaginal discharge.   OB History    Gravida Para Term Preterm AB TAB SAB Ectopic Multiple Living   3 1 1  1  1   1       Past Medical History  Diagnosis Date  . Kidney stones   . Kidney stones   . Kidney stones   . Uterine fibroid   . Migraine     Past Surgical History  Procedure Laterality Date  . Renal artery stent      placed and removed in 2005  . Cesarean section      Nov 2011    Family History  Problem Relation Age of Onset  . Hypertension Mother   . Depression Mother   . Diabetes Father   . Hypertension Father   . Heart disease Father   . Cancer Father     foot    Social History  Substance Use Topics  . Smoking status: Former Research scientist (life sciences)  . Smokeless tobacco: None  . Alcohol Use: No    Allergies:  Allergies  Allergen Reactions  . Erythromycin Base Anaphylaxis  . Ciprofloxacin Nausea And Vomiting  . Latex Other (See Comments)    Causes irritation.  Cristopher [place] Flavor Other (See Comments)    REACTION: MIGRAINES   . Erythromycin Hives and Rash    Prescriptions prior to admission  Medication Sig Dispense Refill Last Dose  . calcium carbonate (TUMS - DOSED IN MG ELEMENTAL CALCIUM) 500 MG chewable  tablet Chew 1 tablet by mouth daily.   05/21/2015 at Unknown time  . acetaminophen (TYLENOL) 500 MG tablet Take 1,000 mg by mouth every 6 (six) hours as needed for moderate pain.   prn  . nitrofurantoin, macrocrystal-monohydrate, (MACROBID) 100 MG capsule Take 1 capsule (100 mg total) by mouth 2 (two) times daily. For 10 days, then take 100mg  by mouth daily for the remainder of the pregnancy. 60 capsule 3 05/14/2015  . promethazine (PHENERGAN) 25 MG tablet Take 1 tablet (25 mg total) by mouth every 6 (six) hours as needed for nausea or vomiting. (Patient not taking: Reported on 05/19/2015) 60 tablet 2 Not Taking    Review of Systems  Constitutional: Negative for fever and chills.  Eyes: Negative for blurred vision and double vision.  Respiratory: Negative for cough and shortness of breath.   Cardiovascular: Negative for chest pain and orthopnea.  Gastrointestinal: Negative for nausea and vomiting.  Genitourinary: Negative for dysuria, frequency and flank pain.  Musculoskeletal: Negative for myalgias.  Skin: Negative for rash.  Neurological: Negative for dizziness, tingling, weakness and headaches.  Endo/Heme/Allergies: Does not bruise/bleed easily.  Psychiatric/Behavioral: Negative for depression and suicidal ideas. The patient is not nervous/anxious.  Physical Exam   Blood pressure 123/70, pulse 87, temperature 98.1 F (36.7 C), temperature source Oral, resp. rate 18, height 5' 0.5" (1.537 m), weight 203 lb 3.2 oz (92.171 kg), last menstrual period 10/26/2014, SpO2 100 %.  Physical Exam  Nursing note and vitals reviewed. Constitutional: She is oriented to person, place, and time. She appears well-developed and well-nourished. No distress.  Pregnant female  HENT:  Head: Normocephalic and atraumatic.  Eyes: Conjunctivae are normal. No scleral icterus.  Neck: Normal range of motion. Neck supple.  Cardiovascular: Normal rate and intact distal pulses.   Respiratory: Effort normal. She  exhibits no tenderness.  GI: Soft. There is no tenderness. There is no rebound and no guarding.  Gravid  Genitourinary: Vagina normal.  Musculoskeletal: Normal range of motion. She exhibits no edema.  Neurological: She is alert and oriented to person, place, and time.  Skin: Skin is warm and dry. No rash noted.  Psychiatric: She has a normal mood and affect.   Dilation: Closed Effacement (%): Thick Cervical Position: [place]: Ballotable Exam by:: Zubayr Bednarczyk   MAU Course  Procedures  MDM Unable to do FFN given recent intercourse  NST 130/mod/+accels, no decels Toco: every 5 min, mild  Assessment and Plan  Ticara A Cherlyn Cushing is 29 y.o. G3P1011 at [redacted]w[redacted]d - Contractions due to recent sex with no cervical change.  - Reviewed return precautions - needs TOC for trich and UTI at next outpatient appt.   Juanita Craver [facility] 05/22/2015, 12:28 PM

## 2015-05-25 ENCOUNTER — Telehealth: Payer: Self-pay | Admitting: *Deleted

## 2015-05-25 NOTE — Telephone Encounter (Signed)
-----   Message from Gretchen Short, [place] sent at 05/24/2015  4:06 PM EST -----   ----- Message -----    From: Shelly Coss, RN    Sent: 05/24/2015   3:01 PM      To: Asencion Islam, RN    ----- Message -----    From: Woodroe Mode, MD    Sent: 05/24/2015  12:55 PM      To: [facility]  GTT elevated needs 3 hr

## 2015-05-25 NOTE — Telephone Encounter (Signed)
Spoke to Dr Harolyn Rutherford about pt 1 hr GTT result and the need for the 3 hr GTT.  Pt is a difficult stick, Dr Harolyn Rutherford agreed that pt could come in for 3 hr GTT and could do CBG sticks instead of venipuncture.  Attempted to call pt to schedule appt, no answer, left message to call the office.

## 2015-06-04 ENCOUNTER — Inpatient Hospital Stay (HOSPITAL_COMMUNITY)
Admission: AD | Admit: 2015-06-04 | Discharge: 2015-06-04 | Disposition: A | Discharge status: Home / Self Care | Payer: Medicaid Other | Source: Ambulatory Visit | Attending: Obstetrics & Gynecology | Admitting: Obstetrics & Gynecology

## 2015-06-04 ENCOUNTER — Encounter (HOSPITAL_COMMUNITY): Payer: Self-pay

## 2015-06-04 DIAGNOSIS — O2343 Unspecified infection of urinary tract in pregnancy, third trimester: Secondary | ICD-10-CM | POA: Diagnosis not present

## 2015-06-04 DIAGNOSIS — Z87891 Personal history of nicotine dependence: Secondary | ICD-10-CM | POA: Diagnosis not present

## 2015-06-04 DIAGNOSIS — Z3A31 31 weeks gestation of pregnancy: Secondary | ICD-10-CM | POA: Diagnosis not present

## 2015-06-04 DIAGNOSIS — Z881 Allergy status to other antibiotic agents status: Secondary | ICD-10-CM | POA: Diagnosis not present

## 2015-06-04 DIAGNOSIS — R109 Unspecified abdominal pain: Secondary | ICD-10-CM | POA: Diagnosis not present

## 2015-06-04 DIAGNOSIS — K219 Gastro-esophageal reflux disease without esophagitis: Secondary | ICD-10-CM

## 2015-06-04 DIAGNOSIS — O9989 Other specified diseases and conditions complicating pregnancy, childbirth and the puerperium: Secondary | ICD-10-CM

## 2015-06-04 DIAGNOSIS — O99613 Diseases of the digestive system complicating pregnancy, third trimester: Secondary | ICD-10-CM | POA: Insufficient documentation

## 2015-06-04 DIAGNOSIS — O26899 Other specified pregnancy related conditions, unspecified trimester: Secondary | ICD-10-CM

## 2015-06-04 LAB — CBC
HEMATOCRIT: 29.8 % — AB (ref 36.0–46.0)
HEMOGLOBIN: 9.4 g/dL — AB (ref 12.0–15.0)
MCH: 25.8 pg — ABNORMAL LOW (ref 26.0–34.0)
MCHC: 31.5 g/dL (ref 30.0–36.0)
MCV: 81.6 fL (ref 78.0–100.0)
Platelets: 185 10*3/uL (ref 150–400)
RBC: 3.65 MIL/uL — AB (ref 3.87–5.11)
RDW: 15.7 % — ABNORMAL HIGH (ref 11.5–15.5)
WBC: 9.3 10*3/uL (ref 4.0–10.5)

## 2015-06-04 LAB — URINALYSIS, ROUTINE W REFLEX MICROSCOPIC
Bilirubin Urine: NEGATIVE
GLUCOSE, UA: NEGATIVE mg/dL
Ketones, ur: NEGATIVE mg/dL
Nitrite: POSITIVE — AB
Protein, ur: NEGATIVE mg/dL
SPECIFIC GRAVITY, URINE: 1.025 (ref 1.005–1.030)
pH: 7 (ref 5.0–8.0)

## 2015-06-04 LAB — URINE MICROSCOPIC-ADD ON

## 2015-06-04 MED ORDER — CEPHALEXIN 500 MG PO CAPS: 500.0000 mg | ORAL_CAPSULE | Freq: Four times a day (QID) | ORAL | Status: DC

## 2015-06-04 MED ORDER — FAMOTIDINE 20 MG PO TABS: 20.0000 mg | ORAL_TABLET | Freq: Two times a day (BID) | ORAL | Status: DC

## 2015-06-04 MED ORDER — GI COCKTAIL ~~LOC~~
30.0000 mL | Freq: Once | ORAL | Status: AC
  Administered 2015-06-04: 30 mL via ORAL
  Filled 2015-06-04: qty 30

## 2015-06-04 MED ORDER — CEFTRIAXONE SODIUM 1 G IJ SOLR
1.0000 g | Freq: Once | INTRAMUSCULAR | Status: AC
  Administered 2015-06-04: 1 g via INTRAMUSCULAR
  Filled 2015-06-04: qty 10

## 2015-06-04 NOTE — MAU Provider Note (Signed)
History     CSN: [id number]  Arrival date and time: 06/04/15 1750   First Provider Initiated Contact with Patient 06/04/15 1828      Chief Complaint  Patient presents with  . Abdominal Pain   HPI Pt is [redacted]w[redacted]d G3P1011 who presents with sharp abdominal pain that feels like really bad gas pain in upper abdomen.  Pt also feels like she is leaking urine intermittently after she voids- no pain- no bleeding. No back pain, nausea, vomiting, chills, fever, constipation or diarrhea.   Pt ate last night 3 bowls of vegetable soup and chocolate cake and milk.  Pain onset this morning started before  She ate. - she drank chocolate milk this morning.  Pt has hx of UTI in pregnancy x4 and is on prophylactic antibiotic- Macrobid- last culture 05/11/2015 showing sensitivity to all antibiotics  Pt has hx of kidney stones; none recent Pt goes to [facility] for [facility] care- next appointment on Dec 28. RN note: Since about 8 or 9 this morning, has been having pain, kind of feels like gas pains. Have just continued. No bleeding, feels like she is ? Leaking, feels wet (abd soft on palpation)         Past Medical History  Diagnosis Date  . Kidney stones   . Kidney stones   . Kidney stones   . Uterine fibroid   . Migraine     Past Surgical History  Procedure Laterality Date  . Renal artery stent      placed and removed in 2005  . Cesarean section      Nov 2011    Family History  Problem Relation Age of Onset  . Hypertension Mother   . Depression Mother   . Diabetes Father   . Hypertension Father   . Heart disease Father   . Cancer Father     foot    Social History  Substance Use Topics  . Smoking status: Former Research scientist (life sciences)  . Smokeless tobacco: None  . Alcohol Use: No    Allergies:  Allergies  Allergen Reactions  . Erythromycin Base Anaphylaxis  . Ciprofloxacin Nausea And Vomiting  . Latex Other (See Comments)    Causes irritation.  Cristopher [place] Flavor Other (See Comments)     REACTION: MIGRAINES   . Erythromycin Hives and Rash    Prescriptions prior to admission  Medication Sig Dispense Refill Last Dose  . acetaminophen (TYLENOL) 500 MG tablet Take 1,000 mg by mouth every 6 (six) hours as needed for moderate pain.   prn  . calcium carbonate (TUMS - DOSED IN MG ELEMENTAL CALCIUM) 500 MG chewable tablet Chew 1 tablet by mouth daily.   05/21/2015 at Unknown time  . nitrofurantoin, macrocrystal-monohydrate, (MACROBID) 100 MG capsule Take 1 capsule (100 mg total) by mouth 2 (two) times daily. For 10 days, then take 100mg  by mouth daily for the remainder of the pregnancy. 60 capsule 3 05/14/2015  . promethazine (PHENERGAN) 25 MG tablet Take 1 tablet (25 mg total) by mouth every 6 (six) hours as needed for nausea or vomiting. (Patient not taking: Reported on 05/19/2015) 60 tablet 2 Not Taking    Review of Systems  Constitutional: Negative for chills.  Gastrointestinal: Positive for abdominal pain. Negative for nausea, vomiting, diarrhea and constipation.  Genitourinary: Negative for dysuria.  Musculoskeletal: Negative for back pain.  Neurological: Negative for headaches.   Physical Exam   Blood pressure 122/68, pulse 102, temperature 97.5 F (36.4 C), temperature source Oral, resp. rate  20, last menstrual period 10/26/2014.  Physical Exam  Nursing note and vitals reviewed. Constitutional: She is oriented to person, place, and time. She appears well-developed and well-nourished. No distress.  HENT:  Head: Normocephalic.  Eyes: Pupils are equal, round, and reactive to light.  Neck: Normal range of motion.  Cardiovascular: Normal rate.   Respiratory: Effort normal.  No CVA tenderness  GI: Soft. She exhibits no distension. There is tenderness. There is no rebound.  NST reactive baseline 150 bpm with 15x15 accelerations No ctx noted  Musculoskeletal: Normal range of motion.  Neurological: She is alert and oriented to person, place, and time.  Skin: Skin is  warm and dry.  Psychiatric: She has a normal mood and affect.    MAU Course  Procedures Results for orders placed or performed during the hospital encounter of 06/04/15 (from the past 24 hour(s))  Urinalysis, Routine w reflex microscopic (not at [facility])     Status: Abnormal   Collection Time: 06/04/15  6:05 PM  Result Value Ref Range   Color, Urine YELLOW YELLOW   APPearance CLEAR CLEAR   Specific Gravity, Urine 1.025 1.005 - 1.030   pH 7.0 5.0 - 8.0   Glucose, UA NEGATIVE NEGATIVE mg/dL   Hgb urine dipstick SMALL (A) NEGATIVE   Bilirubin Urine NEGATIVE NEGATIVE   Ketones, ur NEGATIVE NEGATIVE mg/dL   Protein, ur NEGATIVE NEGATIVE mg/dL   Nitrite POSITIVE (A) NEGATIVE   Leukocytes, UA SMALL (A) NEGATIVE  Urine microscopic-add on     Status: Abnormal   Collection Time: 06/04/15  6:05 PM  Result Value Ref Range   Squamous Epithelial / LPF 6-30 (A) NONE SEEN   WBC, UA 6-30 0 - 5 WBC/hpf   RBC / HPF 6-30 0 - 5 RBC/hpf   Bacteria, UA MANY (A) NONE SEEN  CBC     Status: Abnormal   Collection Time: 06/04/15  6:45 PM  Result Value Ref Range   WBC 9.3 4.0 - 10.5 K/uL   RBC 3.65 (L) 3.87 - 5.11 MIL/uL   Hemoglobin 9.4 (L) 12.0 - 15.0 g/dL   HCT 29.8 (L) 36.0 - 46.0 %   MCV 81.6 78.0 - 100.0 fL   MCH 25.8 (L) 26.0 - 34.0 pg   MCHC 31.5 30.0 - 36.0 g/dL   RDW 15.7 (H) 11.5 - 15.5 %   Platelets 185 150 - 400 K/uL  urine culture pending Urine GC/chlamydia pending GI cocktail given which resolved pain Rocephin 1 gm IM given Dr. Elonda Husky consulted Pt instructed to call [facility] on Monday for f/u of GTT  Assessment and Plan  Abdominal pain in pregnancy- third trimester- resolved with GI cocktail GERD- Pepcid Rx; reviewed food choices Persistant UTI- change to Keflex 500mg  QID for 10 days and re-evaluate at 12/29 appointment Reinforced to pt to continue medication Elevated 1 hr GTT- pt to call [facility] for further testing  [facility] 06/04/2015, 6:32 PM

## 2015-06-04 NOTE — MAU Note (Addendum)
Since about  8 or 9 this morning, has been having pain, kind of feels like gas pains.  Have just continued. No bleeding, feels like she is ? Leaking, feels wet  (abd soft on palpation)

## 2015-06-06 LAB — CULTURE, OB URINE

## 2015-06-07 LAB — GC/CHLAMYDIA PROBE AMP (~~LOC~~) NOT AT ARMC
Chlamydia: NEGATIVE
NEISSERIA GONORRHEA: NEGATIVE

## 2015-06-10 ENCOUNTER — Encounter: Payer: Self-pay | Admitting: Obstetrics and Gynecology

## 2015-06-10 ENCOUNTER — Other Ambulatory Visit (INDEPENDENT_AMBULATORY_CARE_PROVIDER_SITE_OTHER): Payer: Medicaid Other | Admitting: *Deleted

## 2015-06-10 DIAGNOSIS — R7309 Other abnormal glucose: Secondary | ICD-10-CM

## 2015-06-10 DIAGNOSIS — O24410 Gestational diabetes mellitus in pregnancy, diet controlled: Secondary | ICD-10-CM

## 2015-06-10 DIAGNOSIS — R7302 Impaired glucose tolerance (oral): Secondary | ICD-10-CM | POA: Diagnosis not present

## 2015-06-10 DIAGNOSIS — Z8632 Personal history of gestational diabetes: Secondary | ICD-10-CM | POA: Insufficient documentation

## 2015-06-10 LAB — GLUCOSE, POCT (MANUAL RESULT ENTRY)
POC GLUCOSE: 92 mg/dL (ref 70–99)
POC GLUCOSE: 200 mg/dL — AB (ref 70–99)
POC Glucose: 173 mg/dl — AB (ref 70–99)

## 2015-06-10 NOTE — Progress Notes (Signed)
Pt here today for a 3 hr GTT.  

## 2015-06-16 ENCOUNTER — Ambulatory Visit (INDEPENDENT_AMBULATORY_CARE_PROVIDER_SITE_OTHER): Payer: Medicaid Other | Admitting: Certified Nurse Midwife

## 2015-06-16 ENCOUNTER — Encounter: Payer: Medicaid Other | Attending: Obstetrics & Gynecology

## 2015-06-16 ENCOUNTER — Encounter: Payer: Self-pay | Admitting: Certified Nurse Midwife

## 2015-06-16 VITALS — Ht 60.5 in | Wt 211.0 lb

## 2015-06-16 VITALS — BP 110/75 | HR 94 | Wt 212.0 lb

## 2015-06-16 DIAGNOSIS — Z3493 Encounter for supervision of normal pregnancy, unspecified, third trimester: Secondary | ICD-10-CM

## 2015-06-16 DIAGNOSIS — O24410 Gestational diabetes mellitus in pregnancy, diet controlled: Secondary | ICD-10-CM

## 2015-06-16 DIAGNOSIS — Z3483 Encounter for supervision of other normal pregnancy, third trimester: Secondary | ICD-10-CM

## 2015-06-16 DIAGNOSIS — O34219 Maternal care for unspecified type scar from previous cesarean delivery: Secondary | ICD-10-CM

## 2015-06-16 DIAGNOSIS — Z713 Dietary counseling and surveillance: Secondary | ICD-10-CM | POA: Insufficient documentation

## 2015-06-16 DIAGNOSIS — O24419 Gestational diabetes mellitus in pregnancy, unspecified control: Secondary | ICD-10-CM

## 2015-06-16 NOTE — Patient Instructions (Signed)
Gestational Diabetes Mellitus  Gestational diabetes mellitus, often simply referred to as gestational diabetes, is a type of diabetes that some women develop during pregnancy. In gestational diabetes, the pancreas does not make enough insulin (a hormone), the cells are less responsive to the insulin that is made (insulin resistance), or both. Normally, insulin moves sugars from food into the tissue cells. The tissue cells use the sugars for energy. The lack of insulin or the lack of normal response to insulin causes excess sugars to build up in the blood instead of going into the tissue cells. As a result, high blood sugar (hyperglycemia) develops. The effect of high sugar (glucose) levels can cause many problems.   RISK FACTORS  You have an increased chance of developing gestational diabetes if you have a family history of diabetes and also have one or more of the following risk factors:  · A body mass index over 30 (obesity).  · A previous pregnancy with gestational diabetes.  · An older age at the time of pregnancy.  If blood glucose levels are kept in the normal range during pregnancy, women can have a healthy pregnancy. If your blood glucose levels are not well controlled, there may be risks to you, your unborn baby (fetus), your labor and delivery, or your newborn baby.   SYMPTOMS   If symptoms are experienced, they are much like symptoms you would normally expect during pregnancy. The symptoms of gestational diabetes include:   · Increased thirst (polydipsia).  · Increased urination (polyuria).  · Increased urination during the night (nocturia).  · Weight loss. This weight loss may be rapid.  · Frequent, recurring infections.  · Tiredness (fatigue).  · Weakness.  · Vision changes, such as blurred vision.  · Fruity smell to your breath.  · Abdominal pain.  DIAGNOSIS  Diabetes is diagnosed when blood glucose levels are increased. Your blood glucose level may be checked by one or more of the following blood  tests:  · A fasting blood glucose test. You will not be allowed to eat for at least 8 hours before a blood sample is taken.  · A random blood glucose test. Your blood glucose is checked at any time of the day regardless of when you ate.  · An oral glucose tolerance test (OGTT). Your blood glucose is measured after you have not eaten (fasted) for 1-3 hours and then after you drink a glucose-containing beverage. Since the hormones that cause insulin resistance are highest at about 24-28 weeks of a pregnancy, an OGTT is usually performed during that time. If you have risk factors, you may be screened for undiagnosed type 2 diabetes at your first prenatal visit.  TREATMENT   Gestational diabetes should be managed first with diet and exercise. Medicines may be added only if they are needed.  · You will need to take diabetes medicine or insulin daily to keep blood glucose levels in the desired range.  · You will need to match insulin dosing with exercise and healthy food choices.  If you have gestational diabetes, your treatment goal is to maintain the following blood glucose levels:  · Before meals (preprandial): at or below 95 mg/dL.  · After meals (postprandial):    One hour after a meal: at or below 140 mg/dL.    Two hours after a meal: at or below 120 mg/dL.  If you have pre-existing type 1 or type 2 diabetes, your treatment goal is to maintain the following blood glucose levels:  · Before   meals, at bedtime, and overnight: 60-99 mg/dL.  · After meals: peak of 100-129 mg/dL.  HOME CARE INSTRUCTIONS   · Have your hemoglobin A1c level checked twice a year.  · Perform daily blood glucose monitoring as directed by your health care provider. It is common to perform frequent blood glucose monitoring.  · Monitor urine ketones when you are ill and as directed by your health care provider.  · Take your diabetes medicine and insulin as directed by your health care provider to maintain your blood glucose level in the desired  range.  ¨ Never run out of diabetes medicine or insulin. It is needed every day.  ¨ Adjust insulin based on your intake of carbohydrates. Carbohydrates can raise blood glucose levels but need to be included in your diet. Carbohydrates provide vitamins, minerals, and fiber which are an essential part of a healthy diet. Carbohydrates are found in fruits, vegetables, whole grains, dairy products, legumes, and foods containing added sugars.  · Eat healthy foods. Alternate 3 meals with 3 snacks.  · Maintain a healthy weight gain. The usual total expected weight gain varies according to your prepregnancy body mass index (BMI).  · Carry a medical alert card or wear your medical alert jewelry.  · Carry a 15-gram carbohydrate snack with you at all times to treat low blood glucose (hypoglycemia). Some examples of 15-gram carbohydrate snacks include:  ¨ Glucose tablets, 3 or 4.  ¨ Glucose gel, 15-gram tube.  ¨ Raisins, 2 tablespoons (24 g).  ¨ Jelly beans, 6.  ¨ Animal crackers, 8.  ¨ Fruit juice, regular soda, or low-fat milk, 4 ounces (120 mL).  ¨ Gummy treats, 9.  · Recognize hypoglycemia. Hypoglycemia during pregnancy occurs with blood glucose levels of 60 mg/dL and below. The risk for hypoglycemia increases when fasting or skipping meals, during or after intense exercise, and during sleep. Hypoglycemia symptoms can include:  ¨ Tremors or shakes.  ¨ Decreased ability to concentrate.  ¨ Sweating.  ¨ Increased heart rate.  ¨ Headache.  ¨ Dry mouth.  ¨ Hunger.  ¨ Irritability.  ¨ Anxiety.  ¨ Restless sleep.  ¨ Altered speech or coordination.  ¨ Confusion.  · Treat hypoglycemia promptly. If you are alert and able to safely swallow, follow the 15:15 rule:  ¨ Take 15-20 grams of rapid-acting glucose or carbohydrate. Rapid-acting options include glucose gel, glucose tablets, or 4 ounces (120 mL) of fruit juice, regular soda, or low-fat milk.  ¨ Check your blood glucose level 15 minutes after taking the glucose.  ¨ Take 15-20  grams more of glucose if the repeat blood glucose level is still 70 mg/dL or below.  ¨ Eat a meal or snack within 1 hour once blood glucose levels return to normal.  · Be alert to polyuria (excess urination) and polydipsia (excess thirst) which are early signs of hyperglycemia. An early awareness of hyperglycemia allows for prompt treatment. Treat hyperglycemia as directed by your health care provider.  · Engage in at least 30 minutes of physical activity a day or as directed by your health care provider. Ten minutes of physical activity timed 30 minutes after each meal is encouraged to control postprandial blood glucose levels.  · Adjust your insulin dosing and food intake as needed if you start a new exercise or sport.  · Follow your sick-day plan at any time you are unable to eat or drink as usual.  · Avoid tobacco and alcohol use.  · Keep all follow-up visits as directed   by your health care provider.  · Follow the advice of your health care provider regarding your prenatal and post-delivery (postpartum) appointments, meal planning, exercise, medicines, vitamins, blood tests, other medical tests, and physical activities.  · Perform daily skin and foot care. Examine your skin and feet daily for cuts, bruises, redness, nail problems, bleeding, blisters, or sores.  · Brush your teeth and gums at least twice a day and floss at least once a day. Follow up with your dentist regularly.  · Schedule an eye exam during the first trimester of your pregnancy or as directed by your health care provider.  · Share your diabetes management plan with your workplace or school.  · Stay up-to-date with immunizations.  · Learn to manage stress.  · Obtain ongoing diabetes education and support as needed.  · Learn about and consider breastfeeding your baby.  · You should have your blood sugar level checked 6-12 weeks after delivery. This is done with an oral glucose tolerance test (OGTT).  SEEK MEDICAL CARE IF:   · You are unable to  eat food or drink fluids for more than 6 hours.  · You have nausea and vomiting for more than 6 hours.  · You have a blood glucose level of 200 mg/dL and you have ketones in your urine.  · There is a change in mental status.  · You develop vision problems.  · You have a persistent headache.  · You have upper abdominal pain or discomfort.  · You develop an additional serious illness.  · You have diarrhea for more than 6 hours.  · You have been sick or have had a fever for a couple of days and are not getting better.  SEEK IMMEDIATE MEDICAL CARE IF:   · You have difficulty breathing.  · You no longer feel the baby moving.  · You are bleeding or have discharge from your vagina.  · You start having premature contractions or labor.  MAKE SURE YOU:  · Understand these instructions.  · Will watch your condition.  · Will get help right away if you are not doing well or get worse.     This information is not intended to replace advice given to you by your health care provider. Make sure you discuss any questions you have with your health care provider.     Document Released: 09/11/2000 Document Revised: 06/26/2014 Document Reviewed: 01/02/2012  Elsevier Interactive Patient Education ©2016 [employer]

## 2015-06-16 NOTE — Progress Notes (Signed)
Pt has Nutrition and Diabetes appointment today at 4:00pm.

## 2015-06-16 NOTE — Progress Notes (Signed)
Subjective:  [name] is a 29 y.o. G3P1011 at [redacted]w[redacted]d being seen today for ongoing prenatal care.  She is currently monitored for the following issues for this high-risk pregnancy and has Supervision of normal pregnancy; Previous cesarean delivery affecting pregnancy, antepartum; Vaginal bleeding during pregnancy, antepartum; Trichomonal vaginitis during pregnancy; Urinary tract infection during pregnancy in third trimester, antepartum; and Gestational diabetes mellitus (GDM), antepartum on her problem list.  Patient reports no complaints.  Contractions: Irregular. Vag. Bleeding: None.  Movement: Present. Denies leaking of fluid.   The following portions of the patient's history were reviewed and updated as appropriate: allergies, current medications, past family history, past medical history, past social history, past surgical history and problem list. Problem list updated.  Objective:   Filed Vitals:   06/16/15 1317  BP: 110/75  Pulse: 94  Weight: 212 lb (96.163 kg)    Fetal Status: Fetal Heart Rate (bpm): 152   Movement: Present     General:  Alert, oriented and cooperative. Patient is in no acute distress.  Skin: Skin is warm and dry. No rash noted.   Cardiovascular: Normal heart rate noted  Respiratory: Normal respiratory effort, no problems with respiration noted  Abdomen: Soft, gravid, appropriate for gestational age. Pain/Pressure: Present     Pelvic: Vag. Bleeding: None Vag D/C Character: Thin   Cervical exam deferred        Extremities: Normal range of motion.  Edema: None  Mental Status: Normal mood and affect. Normal behavior. Normal judgment and thought content.   Urinalysis: Urine Protein: Negative Urine Glucose: 1+  Assessment and Plan:  Pregnancy: G3P1011 at [redacted]w[redacted]d  1. Diet controlled gestational diabetes mellitus in third trimester reactive - Fetal nonstress test  2. Gestational diabetes mellitus (GDM), antepartum, gestational diabetes method of control  unspecified Appointment w diabetes educator today   3. Previous cesarean delivery affecting pregnancy, antepartum   4. Supervision of normal pregnancy, third trimester   Preterm labor symptoms and general obstetric precautions including but not limited to vaginal bleeding, contractions, leaking of fluid and fetal movement were reviewed in detail with the patient. Please refer to After Visit Summary for other counseling recommendations.  Return in about 5 days (around 06/21/2015) for schedule nst.   Larey Days, CNM

## 2015-06-17 ENCOUNTER — Encounter: Payer: Self-pay | Admitting: *Deleted

## 2015-06-17 NOTE — Progress Notes (Signed)
  Patient was seen on 06/16/15 for Gestational Diabetes self-management . The following learning objectives were met by the patient :   States the definition of Gestational Diabetes  States why dietary management is important in controlling blood glucose  Describes the effects of carbohydrates on blood glucose levels  Demonstrates ability to create a balanced meal plan  Demonstrates carbohydrate counting   States when to check blood glucose levels  Demonstrates proper blood glucose monitoring techniques  States the effect of stress and exercise on blood glucose levels  States the importance of limiting caffeine and abstaining from alcohol and smoking  Plan:  Aim for 2 Carb Choices per meal (30 grams) +/- 1 either way for breakfast Aim for 3 Carb Choices per meal (45 grams) +/- 1 either way from lunch and dinner Aim for 1-2 Carbs per snack Begin reading food labels for Total Carbohydrate and sugar grams of foods Consider  increasing your activity level by walking daily as tolerated Begin checking BG before breakfast and 1-2 hours after first bit of breakfast, lunch and dinner after  as directed by MD  Take medication  as directed by MD  Blood glucose monitor given: accu-Chek Nano Lot # [id number] Exp: 2016/03/18 Blood glucose reading: '80mg'$ /dl  Patient instructed to monitor glucose levels: FBS: 60 - <90 1 hour: <140 2 hour: <120  Patient received the following handouts:  Nutrition Diabetes and Pregnancy  Carbohydrate Counting List  Meal Planning worksheet  Patient will be seen for follow-up as needed.

## 2015-06-21 ENCOUNTER — Ambulatory Visit (INDEPENDENT_AMBULATORY_CARE_PROVIDER_SITE_OTHER): Payer: Medicaid Other | Admitting: Family Medicine

## 2015-06-21 ENCOUNTER — Encounter: Payer: Self-pay | Admitting: *Deleted

## 2015-06-21 ENCOUNTER — Telehealth: Payer: Self-pay | Admitting: *Deleted

## 2015-06-21 VITALS — BP 102/58 | HR 92 | Wt 209.0 lb

## 2015-06-21 DIAGNOSIS — O24410 Gestational diabetes mellitus in pregnancy, diet controlled: Secondary | ICD-10-CM

## 2015-06-21 DIAGNOSIS — O34219 Maternal care for unspecified type scar from previous cesarean delivery: Secondary | ICD-10-CM

## 2015-06-21 DIAGNOSIS — Z3483 Encounter for supervision of other normal pregnancy, third trimester: Secondary | ICD-10-CM

## 2015-06-21 DIAGNOSIS — Z3493 Encounter for supervision of normal pregnancy, unspecified, third trimester: Secondary | ICD-10-CM

## 2015-06-21 DIAGNOSIS — O24414 Gestational diabetes mellitus in pregnancy, insulin controlled: Secondary | ICD-10-CM

## 2015-06-21 DIAGNOSIS — O24419 Gestational diabetes mellitus in pregnancy, unspecified control: Secondary | ICD-10-CM

## 2015-06-21 MED ORDER — GLUCOSE BLOOD VI STRP: ORAL_STRIP | Status: DC

## 2015-06-21 MED ORDER — ACCU-CHEK FASTCLIX LANCETS MISC: Status: DC

## 2015-06-21 NOTE — Progress Notes (Signed)
  Subjective:  [name] is a 30 y.o. G3P1011 at [redacted]w[redacted]d being seen today for ongoing prenatal care.  She is currently monitored for the following issues for this high-risk pregnancy and has Supervision of normal pregnancy; Previous cesarean delivery affecting pregnancy, antepartum; Vaginal bleeding during pregnancy, antepartum; Trichomonal vaginitis during pregnancy; Urinary tract infection during pregnancy in third trimester, antepartum; and Gestational diabetes mellitus (GDM), antepartum on her problem list.  Patient reports no complaints.  Contractions: Irregular. Vag. Bleeding: None.  Movement: Present. Denies leaking of fluid.   The following portions of the patient's history were reviewed and updated as appropriate: allergies, current medications, past family history, past medical history, past social history, past surgical history and problem list. Problem list updated.  Objective:   Filed Vitals:   06/21/15 1452  BP: 102/58  Pulse: 92  Weight: 209 lb (94.802 kg)    Fetal Status: Fetal Heart Rate (bpm): 143   Movement: Present     General:  Alert, oriented and cooperative. Patient is in no acute distress.  Skin: Skin is warm and dry. No rash noted.   Cardiovascular: Normal heart rate noted  Respiratory: Normal respiratory effort, no problems with respiration noted  Abdomen: Soft, gravid, appropriate for gestational age. Pain/Pressure: Present     Pelvic: Vag. Bleeding: None Vag D/C Character: Thin   Cervical exam deferred        Extremities: Normal range of motion.  Edema: None  Mental Status: Normal mood and affect. Normal behavior. Normal judgment and thought content.   Urinalysis: Urine Protein: Trace Urine Glucose: Trace NST reviewed and reactive. FBS are 61-99 (one out of range) 2 hour pp 96-187 (2 out of range-diet) Assessment and Plan:  Pregnancy: G3P1011 at [redacted]w[redacted]d  1. Diet controlled gestational diabetes mellitus in third trimester No need for 2x/wk testing  due to diet control for now - Fetal nonstress test  2. Supervision of normal pregnancy, third trimester Continue prenatal care.   3. Previous cesarean delivery affecting pregnancy, antepartum For Elective RCS and BTL--considering both TOLAC and BTL--papers signed.   Preterm labor symptoms and general obstetric precautions including but not limited to vaginal bleeding, contractions, leaking of fluid and fetal movement were reviewed in detail with the patient. Please refer to After Visit Summary for other counseling recommendations.  Return in 1 week (on 06/28/2015) for ob visit.   Donnamae Jude, MD

## 2015-06-21 NOTE — Telephone Encounter (Signed)
-----   Message from Francia Greaves sent at 06/17/2015  9:22 AM EST ----- Regarding: GDM Contact: [phone] Rx for lancets (Accu-Chek fast click) and strips,  The meter she is using is Accu-Chek [employer] at [employer]

## 2015-06-21 NOTE — Telephone Encounter (Signed)
I have sent in strips and lancets.

## 2015-06-21 NOTE — Patient Instructions (Addendum)
Following an appropriate diet and keeping your blood sugar under control is the most important thing to do for your health and that of your unborn baby.  Please check your blood sugar 4 times daily.  Please keep accurate BS logs and bring them with you to every visit.  Please bring your meter also.  Goals for Blood sugar should be: 1. Fasting (first thing in the morning before eating) should be less than 90.   2.  2 hours after meals should be less than 120.  Please eat 3 meals and 3 snacks.  Include protein (meat, dairy-cheese, eggs, nuts) with all meals.  Be mindful that carbohydrates increase your blood sugar.  Not just sweet food (cookies, cake, donuts, fruit, juice, soda) but also bread, pasta, rice, and potatoes.  You have to limit how many carbs you are eating.  Adding exercise, as little as 30 minutes a day can decrease your blood sugar.  Gestational Diabetes Mellitus Gestational diabetes mellitus, often simply referred to as gestational diabetes, is a type of diabetes that some women develop during pregnancy. In gestational diabetes, the pancreas does not make enough insulin (a hormone), the cells are less responsive to the insulin that is made (insulin resistance), or both.Normally, insulin moves sugars from food into the tissue cells. The tissue cells use the sugars for energy. The lack of insulin or the lack of normal response to insulin causes excess sugars to build up in the blood instead of going into the tissue cells. As a result, high blood sugar (hyperglycemia) develops. The effect of high sugar (glucose) levels can cause many problems.  RISK FACTORS You have an increased chance of developing gestational diabetes if you have a family history of diabetes and also have one or more of the following risk factors:  A body mass index over 30 (obesity).  A previous pregnancy with gestational diabetes.  An older age at the time of pregnancy. If blood glucose levels are kept in  the normal range during pregnancy, women can have a healthy pregnancy. If your blood glucose levels are not well controlled, there may be risks to you, your unborn baby (fetus), your labor and delivery, or your newborn baby.  SYMPTOMS  If symptoms are experienced, they are much like symptoms you would normally expect during pregnancy. The symptoms of gestational diabetes include:   Increased thirst (polydipsia).  Increased urination (polyuria).  Increased urination during the night (nocturia).  Weight loss. This weight loss may be rapid.  Frequent, recurring infections.  Tiredness (fatigue).  Weakness.  Vision changes, such as blurred vision.  Fruity smell to your breath.  Abdominal pain. DIAGNOSIS Diabetes is diagnosed when blood glucose levels are increased. Your blood glucose level may be checked by one or more of the following blood tests:  A fasting blood glucose test. You will not be allowed to eat for at least 8 hours before a blood sample is taken.  A random blood glucose test. Your blood glucose is checked at any time of the day regardless of when you ate.  An oral glucose tolerance test (OGTT). Your blood glucose is measured after you have not eaten (fasted) for 1-3 hours and then after you drink a glucose-containing beverage. Since the hormones that cause insulin resistance are highest at about 24-28 weeks of a pregnancy, an OGTT is usually performed during that time. If you have risk factors, you may be screened for undiagnosed type 2 diabetes at your first prenatal visit. TREATMENT  Gestational diabetes   should be managed first with diet and exercise. Medicines may be added only if they are needed.  You will need to take diabetes medicine or insulin daily to keep blood glucose levels in the desired range.  You will need to match insulin dosing with exercise and healthy food choices. If you have gestational diabetes, your treatment goal is to maintain the following  blood glucose levels:  Before meals (preprandial): at or below 95 mg/dL.  After meals (postprandial):  One hour after a meal: at or below 140 mg/dL.  Two hours after a meal: at or below 120 mg/dL. If you have pre-existing type 1 or type 2 diabetes, your treatment goal is to maintain the following blood glucose levels:  Before meals, at bedtime, and overnight: 60-99 mg/dL.  After meals: peak of 100-129 mg/dL. HOME CARE INSTRUCTIONS   Have your hemoglobin A1c level checked twice a year.  Perform daily blood glucose monitoring as directed by your health care provider. It is common to perform frequent blood glucose monitoring.  Monitor urine ketones when you are ill and as directed by your health care provider.  Take your diabetes medicine and insulin as directed by your health care provider to maintain your blood glucose level in the desired range.  Never run out of diabetes medicine or insulin. It is needed every day.  Adjust insulin based on your intake of carbohydrates. Carbohydrates can raise blood glucose levels but need to be included in your diet. Carbohydrates provide vitamins, minerals, and fiber which are an essential part of a healthy diet. Carbohydrates are found in fruits, vegetables, whole grains, dairy products, legumes, and foods containing added sugars.  Eat healthy foods. Alternate 3 meals with 3 snacks.  Maintain a healthy weight gain. The usual total expected weight gain varies according to your prepregnancy body mass index (BMI).  Carry a medical alert card or wear your medical alert jewelry.  Carry a 15-gram carbohydrate snack with you at all times to treat low blood glucose (hypoglycemia). Some examples of 15-gram carbohydrate snacks include:  Glucose tablets, 3 or 4.  Glucose gel, 15-gram tube.  Raisins, 2 tablespoons (24 g).  Jelly beans, 6.  Animal crackers, 8.  Fruit juice, regular soda, or low-fat milk, 4 ounces (120 mL).  Gummy treats,  9.  Recognize hypoglycemia. Hypoglycemia during pregnancy occurs with blood glucose levels of 60 mg/dL and below. The risk for hypoglycemia increases when fasting or skipping meals, during or after intense exercise, and during sleep. Hypoglycemia symptoms can include:  Tremors or shakes.  Decreased ability to concentrate.  Sweating.  Increased heart rate.  Headache.  Dry mouth.  Hunger.  Irritability.  Anxiety.  Restless sleep.  Altered speech or coordination.  Confusion.  Treat hypoglycemia promptly. If you are alert and able to safely swallow, follow the 15:15 rule:  Take 15-20 grams of rapid-acting glucose or carbohydrate. Rapid-acting options include glucose gel, glucose tablets, or 4 ounces (120 mL) of fruit juice, regular soda, or low-fat milk.  Check your blood glucose level 15 minutes after taking the glucose.  Take 15-20 grams more of glucose if the repeat blood glucose level is still 70 mg/dL or below.  Eat a meal or snack within 1 hour once blood glucose levels return to normal.  Be alert to polyuria (excess urination) and polydipsia (excess thirst) which are early signs of hyperglycemia. An early awareness of hyperglycemia allows for prompt treatment. Treat hyperglycemia as directed by your health care provider.  Engage in at least   30 minutes of physical activity a day or as directed by your health care provider. Ten minutes of physical activity timed 30 minutes after each meal is encouraged to control postprandial blood glucose levels.  Adjust your insulin dosing and food intake as needed if you start a new exercise or sport.  Follow your sick-day plan at any time you are unable to eat or drink as usual.  Avoid tobacco and alcohol use.  Keep all follow-up visits as directed by your health care provider.  Follow the advice of your health care provider regarding your prenatal and post-delivery (postpartum) appointments, meal planning, exercise, medicines,  vitamins, blood tests, other medical tests, and physical activities.  Perform daily skin and foot care. Examine your skin and feet daily for cuts, bruises, redness, nail problems, bleeding, blisters, or sores.  Brush your teeth and gums at least twice a day and floss at least once a day. Follow up with your dentist regularly.  Schedule an eye exam during the first trimester of your pregnancy or as directed by your health care provider.  Share your diabetes management plan with your workplace or school.  Stay up-to-date with immunizations.  Learn to manage stress.  Obtain ongoing diabetes education and support as needed.  Learn about and consider breastfeeding your baby.  You should have your blood sugar level checked 6-12 weeks after delivery. This is done with an oral glucose tolerance test (OGTT). SEEK MEDICAL CARE IF:   You are unable to eat food or drink fluids for more than 6 hours.  You have nausea and vomiting for more than 6 hours.  You have a blood glucose level of 200 mg/dL and you have ketones in your urine.  There is a change in mental status.  You develop vision problems.  You have a persistent headache.  You have upper abdominal pain or discomfort.  You develop an additional serious illness.  You have diarrhea for more than 6 hours.  You have been sick or have had a fever for a couple of days and are not getting better. SEEK IMMEDIATE MEDICAL CARE IF:   You have difficulty breathing.  You no longer feel the baby moving.  You are bleeding or have discharge from your vagina.  You start having premature contractions or labor. MAKE SURE YOU:  Understand these instructions.  Will watch your condition.  Will get help right away if you are not doing well or get worse.   This information is not intended to replace advice given to you by your health care provider. Make sure you discuss any questions you have with your health care provider.   Document  Released: 09/11/2000 Document Revised: 06/26/2014 Document Reviewed: 01/02/2012 [employer] Interactive Patient Education 2016 [employer]  Breastfeeding Deciding to breastfeed is one of the best choices you can make for you and your baby. A change in hormones during pregnancy causes your breast tissue to grow and increases the number and size of your milk ducts. These hormones also allow proteins, sugars, and fats from your blood supply to make breast milk in your milk-producing glands. Hormones prevent breast milk from being released before your baby is born as well as prompt milk flow after birth. Once breastfeeding has begun, thoughts of your baby, as well as his or her sucking or crying, can stimulate the release of milk from your milk-producing glands.  BENEFITS OF BREASTFEEDING For Your Baby  Your first milk (colostrum) helps your baby's digestive system function better.  There are   antibodies in your milk that help your baby fight off infections.  Your baby has a lower incidence of asthma, allergies, and sudden infant death syndrome.  The nutrients in breast milk are better for your baby than infant formulas and are designed uniquely for your baby's needs.  Breast milk improves your baby's brain development.  Your baby is less likely to develop other conditions, such as childhood obesity, asthma, or type 2 diabetes mellitus. For You  Breastfeeding helps to create a very special bond between you and your baby.  Breastfeeding is convenient. Breast milk is always available at the correct temperature and costs nothing.  Breastfeeding helps to burn calories and helps you lose the weight gained during pregnancy.  Breastfeeding makes your uterus contract to its prepregnancy size faster and slows bleeding (lochia) after you give birth.   Breastfeeding helps to lower your risk of developing type 2 diabetes mellitus, osteoporosis, and breast or ovarian cancer later in life. SIGNS THAT  YOUR BABY IS HUNGRY Early Signs of Hunger  Increased alertness or activity.  Stretching.  Movement of the head from side to side.  Movement of the head and opening of the mouth when the corner of the mouth or cheek is stroked (rooting).  Increased sucking sounds, smacking lips, cooing, sighing, or squeaking.  Hand-to-mouth movements.  Increased sucking of fingers or hands. Late Signs of Hunger  Fussing.  Intermittent crying. Extreme Signs of Hunger Signs of extreme hunger will require calming and consoling before your baby will be able to breastfeed successfully. Do not wait for the following signs of extreme hunger to occur before you initiate breastfeeding:  Restlessness.  A loud, strong cry.  Screaming. BREASTFEEDING BASICS Breastfeeding Initiation  Find a comfortable place to sit or lie down, with your neck and back well supported.  Place a pillow or rolled up blanket under your baby to bring him or her to the level of your breast (if you are seated). Nursing pillows are specially designed to help support your arms and your baby while you breastfeed.  Make sure that your baby's abdomen is facing your abdomen.  Gently massage your breast. With your fingertips, massage from your chest wall toward your nipple in a circular motion. This encourages milk flow. You may need to continue this action during the feeding if your milk flows slowly.  Support your breast with 4 fingers underneath and your thumb above your nipple. Make sure your fingers are well away from your nipple and your baby's mouth.  Stroke your baby's lips gently with your finger or nipple.  When your baby's mouth is open wide enough, quickly bring your baby to your breast, placing your entire nipple and as much of the colored area around your nipple (areola) as possible into your baby's mouth.  More areola should be visible above your baby's upper lip than below the lower lip.  Your baby's tongue should  be between his or her lower gum and your breast.  Ensure that your baby's mouth is correctly positioned around your nipple (latched). Your baby's lips should create a seal on your breast and be turned out (everted).  It is common for your baby to suck about 2-3 minutes in order to start the flow of breast milk. Latching Teaching your baby how to latch on to your breast properly is very important. An improper latch can cause nipple pain and decreased milk supply for you and poor weight gain in your baby. Also, if your baby is not   latched onto your nipple properly, he or she may swallow some air during feeding. This can make your baby fussy. Burping your baby when you switch breasts during the feeding can help to get rid of the air. However, teaching your baby to latch on properly is still the best way to prevent fussiness from swallowing air while breastfeeding. Signs that your baby has successfully latched on to your nipple:  Silent tugging or silent sucking, without causing you pain.  Swallowing heard between every 3-4 sucks.  Muscle movement above and in front of his or her ears while sucking. Signs that your baby has not successfully latched on to nipple:  Sucking sounds or smacking sounds from your baby while breastfeeding.  Nipple pain. If you think your baby has not latched on correctly, slip your finger into the corner of your baby's mouth to break the suction and place it between your baby's gums. Attempt breastfeeding initiation again. Signs of Successful Breastfeeding Signs from your baby:  A gradual decrease in the number of sucks or complete cessation of sucking.  Falling asleep.  Relaxation of his or her body.  Retention of a small amount of milk in his or her mouth.  Letting go of your breast by himself or herself. Signs from you:  Breasts that have increased in firmness, weight, and size 1-3 hours after feeding.  Breasts that are softer immediately after  breastfeeding.  Increased milk volume, as well as a change in milk consistency and color by the fifth day of breastfeeding.  Nipples that are not sore, cracked, or bleeding. Signs That Your Baby is Getting Enough Milk  Wetting at least 3 diapers in a 24-hour period. The urine should be clear and pale yellow by age 5 days.  At least 3 stools in a 24-hour period by age 5 days. The stool should be soft and yellow.  At least 3 stools in a 24-hour period by age 7 days. The stool should be seedy and yellow.  No loss of weight greater than 10% of birth weight during the first 3 days of age.  Average weight gain of 4-7 ounces (113-198 g) per week after age 4 days.  Consistent daily weight gain by age 5 days, without weight loss after the age of 2 weeks. After a feeding, your baby may spit up a small amount. This is common. BREASTFEEDING FREQUENCY AND DURATION Frequent feeding will help you make more milk and can prevent sore nipples and breast engorgement. Breastfeed when you feel the need to reduce the fullness of your breasts or when your baby shows signs of hunger. This is called "breastfeeding on demand." Avoid introducing a pacifier to your baby while you are working to establish breastfeeding (the first 4-6 weeks after your baby is born). After this time you may choose to use a pacifier. Research has shown that pacifier use during the first year of a baby's life decreases the risk of sudden infant death syndrome (SIDS). Allow your baby to feed on each breast as long as he or she wants. Breastfeed until your baby is finished feeding. When your baby unlatches or falls asleep while feeding from the first breast, offer the second breast. Because newborns are often sleepy in the first few weeks of life, you may need to awaken your baby to get him or her to feed. Breastfeeding times will vary from baby to baby. However, the following rules can serve as a guide to help you ensure that your baby is    properly fed:  Newborns (babies 4 weeks of age or younger) may breastfeed every 1-3 hours.  Newborns should not go longer than 3 hours during the day or 5 hours during the night without breastfeeding.  You should breastfeed your baby a minimum of 8 times in a 24-hour period until you begin to introduce solid foods to your baby at around 6 months of age. BREAST MILK PUMPING Pumping and storing breast milk allows you to ensure that your baby is exclusively fed your breast milk, even at times when you are unable to breastfeed. This is especially important if you are going back to work while you are still breastfeeding or when you are not able to be present during feedings. Your lactation consultant can give you guidelines on how long it is safe to store breast milk. A breast pump is a machine that allows you to pump milk from your breast into a sterile bottle. The pumped breast milk can then be stored in a refrigerator or freezer. Some breast pumps are operated by hand, while others use electricity. Ask your lactation consultant which type will work best for you. Breast pumps can be purchased, but some hospitals and breastfeeding support groups lease breast pumps on a monthly basis. A lactation consultant can teach you how to hand express breast milk, if you prefer not to use a pump. CARING FOR YOUR BREASTS WHILE YOU BREASTFEED Nipples can become dry, cracked, and sore while breastfeeding. The following recommendations can help keep your breasts moisturized and healthy:  Avoid using soap on your nipples.  Wear a supportive bra. Although not required, special nursing bras and tank tops are designed to allow access to your breasts for breastfeeding without taking off your entire bra or top. Avoid wearing underwire-style bras or extremely tight bras.  Air dry your nipples for 3-4minutes after each feeding.  Use only cotton bra pads to absorb leaked breast milk. Leaking of breast milk between feedings  is normal.  Use lanolin on your nipples after breastfeeding. Lanolin helps to maintain your skin's normal moisture barrier. If you use pure lanolin, you do not need to wash it off before feeding your baby again. Pure lanolin is not toxic to your baby. You may also hand express a few drops of breast milk and gently massage that milk into your nipples and allow the milk to air dry. In the first few weeks after giving birth, some women experience extremely full breasts (engorgement). Engorgement can make your breasts feel heavy, warm, and tender to the touch. Engorgement peaks within 3-5 days after you give birth. The following recommendations can help ease engorgement:  Completely empty your breasts while breastfeeding or pumping. You may want to start by applying warm, moist heat (in the shower or with warm water-soaked hand towels) just before feeding or pumping. This increases circulation and helps the milk flow. If your baby does not completely empty your breasts while breastfeeding, pump any extra milk after he or she is finished.  Wear a snug bra (nursing or regular) or tank top for 1-2 days to signal your body to slightly decrease milk production.  Apply ice packs to your breasts, unless this is too uncomfortable for you.  Make sure that your baby is latched on and positioned properly while breastfeeding. If engorgement persists after 48 hours of following these recommendations, contact your health care provider or a lactation consultant. OVERALL HEALTH CARE RECOMMENDATIONS WHILE BREASTFEEDING  Eat healthy foods. Alternate between meals and snacks, eating 3   of each per day. Because what you eat affects your breast milk, some of the foods may make your baby more irritable than usual. Avoid eating these foods if you are sure that they are negatively affecting your baby.  Drink milk, fruit juice, and water to satisfy your thirst (about 10 glasses a day).  Rest often, relax, and continue to take  your prenatal vitamins to prevent fatigue, stress, and anemia.  Continue breast self-awareness checks.  Avoid chewing and smoking tobacco. Chemicals from cigarettes that pass into breast milk and exposure to secondhand smoke may harm your baby.  Avoid alcohol and drug use, including marijuana. Some medicines that may be harmful to your baby can pass through breast milk. It is important to ask your health care provider before taking any medicine, including all over-the-counter and prescription medicine as well as vitamin and herbal supplements. It is possible to become pregnant while breastfeeding. If birth control is desired, ask your health care provider about options that will be safe for your baby. SEEK MEDICAL CARE IF:  You feel like you want to stop breastfeeding or have become frustrated with breastfeeding.  You have painful breasts or nipples.  Your nipples are cracked or bleeding.  Your breasts are red, tender, or warm.  You have a swollen area on either breast.  You have a fever or chills.  You have nausea or vomiting.  You have drainage other than breast milk from your nipples.  Your breasts do not become full before feedings by the fifth day after you give birth.  You feel sad and depressed.  Your baby is too sleepy to eat well.  Your baby is having trouble sleeping.   Your baby is wetting less than 3 diapers in a 24-hour period.  Your baby has less than 3 stools in a 24-hour period.  Your baby's skin or the white part of his or her eyes becomes yellow.   Your baby is not gaining weight by 5 days of age. SEEK IMMEDIATE MEDICAL CARE IF:  Your baby is overly tired (lethargic) and does not want to wake up and feed.  Your baby develops an unexplained fever.   This information is not intended to replace advice given to you by your health care provider. Make sure you discuss any questions you have with your health care provider.   Document Released: 06/05/2005  Document Revised: 02/24/2015 Document Reviewed: 11/27/2012 [employer] Interactive Patient Education 2016 [employer]  

## 2015-06-22 ENCOUNTER — Encounter (HOSPITAL_COMMUNITY): Payer: Self-pay | Admitting: *Deleted

## 2015-06-23 ENCOUNTER — Other Ambulatory Visit: Payer: Self-pay | Admitting: Obstetrics and Gynecology

## 2015-06-24 ENCOUNTER — Other Ambulatory Visit: Payer: Medicaid Other

## 2015-06-28 ENCOUNTER — Encounter: Payer: Medicaid Other | Admitting: Family Medicine

## 2015-06-29 ENCOUNTER — Ambulatory Visit (INDEPENDENT_AMBULATORY_CARE_PROVIDER_SITE_OTHER): Payer: Medicaid Other | Admitting: Family Medicine

## 2015-06-29 VITALS — BP 109/76 | HR 108 | Wt 212.0 lb

## 2015-06-29 DIAGNOSIS — R300 Dysuria: Secondary | ICD-10-CM | POA: Diagnosis not present

## 2015-06-29 DIAGNOSIS — Z3493 Encounter for supervision of normal pregnancy, unspecified, third trimester: Secondary | ICD-10-CM

## 2015-06-29 DIAGNOSIS — Z3483 Encounter for supervision of other normal pregnancy, third trimester: Secondary | ICD-10-CM

## 2015-06-29 DIAGNOSIS — O24410 Gestational diabetes mellitus in pregnancy, diet controlled: Secondary | ICD-10-CM

## 2015-06-29 LAB — POCT URINALYSIS DIPSTICK
Bilirubin, UA: NEGATIVE
Glucose, UA: NEGATIVE
Nitrite, UA: POSITIVE
Spec Grav, UA: 1.015
Urobilinogen, UA: NEGATIVE
pH, UA: 6

## 2015-06-29 MED ORDER — CEPHALEXIN 500 MG PO CAPS: 500.0000 mg | ORAL_CAPSULE | Freq: Four times a day (QID) | ORAL | Status: DC

## 2015-06-29 NOTE — Patient Instructions (Addendum)
Having a circumcision done in the hospital costs approximately $480.  This will have to be paid in full prior to circumcision being performed.  There are places to have circumcision done as an outpatient which are cheaper.    Circumcisions      Provider   Phone    Price     ------------------------------------------------------------------------------   [facility]  [phone]  $480 by 4 wks     [facility]   [phone]  $[phone] by 4 wks     [facility]   [phone]  $175 by 2 wks    [facility]   [id number]  $250 by 7 days [facility]   [phone]  $150 by 4 wks  Breastfeeding Deciding to breastfeed is one of the best choices you can make for you and your baby. A change in hormones during pregnancy causes your breast tissue to grow and increases the number and size of your milk ducts. These hormones also allow proteins, sugars, and fats from your blood supply to make breast milk in your milk-producing glands. Hormones prevent breast milk from being released before your baby is born as well as prompt milk flow after birth. Once breastfeeding has begun, thoughts of your baby, as well as his or her sucking or crying, can stimulate the release of milk from your milk-producing glands.  BENEFITS OF BREASTFEEDING For Your Baby  Your first milk (colostrum) helps your baby's digestive system function better.  There are antibodies in your milk that help your baby fight off infections.  Your baby has a lower incidence of asthma, allergies, and sudden infant death syndrome.  The nutrients in breast milk are better for your baby than infant formulas and are designed uniquely for your baby's needs.  Breast milk improves your baby's brain development.  Your baby is less likely to develop other conditions, such as childhood obesity, asthma, or type 2 diabetes mellitus. For You  Breastfeeding helps to create a very special bond between you and your baby.  Breastfeeding is convenient. Breast milk is always available at the  correct temperature and costs nothing.  Breastfeeding helps to burn calories and helps you lose the weight gained during pregnancy.  Breastfeeding makes your uterus contract to its prepregnancy size faster and slows bleeding (lochia) after you give birth.   Breastfeeding helps to lower your risk of developing type 2 diabetes mellitus, osteoporosis, and breast or ovarian cancer later in life. SIGNS THAT YOUR BABY IS HUNGRY Early Signs of Hunger  Increased alertness or activity.  Stretching.  Movement of the head from side to side.  Movement of the head and opening of the mouth when the corner of the mouth or cheek is stroked (rooting).  Increased sucking sounds, smacking lips, cooing, sighing, or squeaking.  Hand-to-mouth movements.  Increased sucking of fingers or hands. Late Signs of Hunger  Fussing.  Intermittent crying. Extreme Signs of Hunger Signs of extreme hunger will require calming and consoling before your baby will be able to breastfeed successfully. Do not wait for the following signs of extreme hunger to occur before you initiate breastfeeding:  Restlessness.  A loud, strong cry.  Screaming. BREASTFEEDING BASICS Breastfeeding Initiation  Find a comfortable place to sit or lie down, with your neck and back well supported.  Place a pillow or rolled up blanket under your baby to bring him or her to the level of your breast (if you are seated). Nursing pillows are specially designed to help support your arms and your baby while you  breastfeed.  Make sure that your baby's abdomen is facing your abdomen.  Gently massage your breast. With your fingertips, massage from your chest wall toward your nipple in a circular motion. This encourages milk flow. You may need to continue this action during the feeding if your milk flows slowly.  Support your breast with 4 fingers underneath and your thumb above your nipple. Make sure your fingers are well away from your  nipple and your baby's mouth.  Stroke your baby's lips gently with your finger or nipple.  When your baby's mouth is open wide enough, quickly bring your baby to your breast, placing your entire nipple and as much of the colored area around your nipple (areola) as possible into your baby's mouth.  More areola should be visible above your baby's upper lip than below the lower lip.  Your baby's tongue should be between his or her lower gum and your breast.  Ensure that your baby's mouth is correctly positioned around your nipple (latched). Your baby's lips should create a seal on your breast and be turned out (everted).  It is common for your baby to suck about 2-3 minutes in order to start the flow of breast milk. Latching Teaching your baby how to latch on to your breast properly is very important. An improper latch can cause nipple pain and decreased milk supply for you and poor weight gain in your baby. Also, if your baby is not latched onto your nipple properly, he or she may swallow some air during feeding. This can make your baby fussy. Burping your baby when you switch breasts during the feeding can help to get rid of the air. However, teaching your baby to latch on properly is still the best way to prevent fussiness from swallowing air while breastfeeding. Signs that your baby has successfully latched on to your nipple:  Silent tugging or silent sucking, without causing you pain.  Swallowing heard between every 3-4 sucks.  Muscle movement above and in front of his or her ears while sucking. Signs that your baby has not successfully latched on to nipple:  Sucking sounds or smacking sounds from your baby while breastfeeding.  Nipple pain. If you think your baby has not latched on correctly, slip your finger into the corner of your baby's mouth to break the suction and place it between your baby's gums. Attempt breastfeeding initiation again. Signs of Successful Breastfeeding Signs  from your baby:  A gradual decrease in the number of sucks or complete cessation of sucking.  Falling asleep.  Relaxation of his or her body.  Retention of a small amount of milk in his or her mouth.  Letting go of your breast by himself or herself. Signs from you:  Breasts that have increased in firmness, weight, and size 1-3 hours after feeding.  Breasts that are softer immediately after breastfeeding.  Increased milk volume, as well as a change in milk consistency and color by the fifth day of breastfeeding.  Nipples that are not sore, cracked, or bleeding. Signs That Your [employer] is Getting Enough Milk  Wetting at least 3 diapers in a 24-hour period. The urine should be clear and pale yellow by age 14 days.  At least 3 stools in a 24-hour period by age 14 days. The stool should be soft and yellow.  At least 3 stools in a 24-hour period by age 18 days. The stool should be seedy and yellow.  No loss of weight greater than 10% of birth  weight during the first 72 days of age.  Average weight gain of 4-7 ounces (113-198 g) per week after age 72 days.  Consistent daily weight gain by age 81 days, without weight loss after the age of 2 weeks. After a feeding, your baby may spit up a small amount. This is common. BREASTFEEDING FREQUENCY AND DURATION Frequent feeding will help you make more milk and can prevent sore nipples and breast engorgement. Breastfeed when you feel the need to reduce the fullness of your breasts or when your baby shows signs of hunger. This is called "breastfeeding on demand." Avoid introducing a pacifier to your baby while you are working to establish breastfeeding (the first 4-6 weeks after your baby is born). After this time you may choose to use a pacifier. Research has shown that pacifier use during the first year of a baby's life decreases the risk of sudden infant death syndrome (SIDS). Allow your baby to feed on each breast as long as he or she wants. Breastfeed  until your baby is finished feeding. When your baby unlatches or falls asleep while feeding from the first breast, offer the second breast. Because newborns are often sleepy in the first few weeks of life, you may need to awaken your baby to get him or her to feed. Breastfeeding times will vary from baby to baby. However, the following rules can serve as a guide to help you ensure that your baby is properly fed:  Newborns (babies 2 weeks of age or younger) may breastfeed every 1-3 hours.  Newborns should not go longer than 3 hours during the day or 5 hours during the night without breastfeeding.  You should breastfeed your baby a minimum of 8 times in a 24-hour period until you begin to introduce solid foods to your baby at around 61 months of age. BREAST MILK PUMPING Pumping and storing breast milk allows you to ensure that your baby is exclusively fed your breast milk, even at times when you are unable to breastfeed. This is especially important if you are going back to work while you are still breastfeeding or when you are not able to be present during feedings. Your lactation consultant can give you guidelines on how long it is safe to store breast milk. A breast pump is a machine that allows you to pump milk from your breast into a sterile bottle. The pumped breast milk can then be stored in a refrigerator or freezer. Some breast pumps are operated by hand, while others use electricity. Ask your lactation consultant which type will work best for you. Breast pumps can be purchased, but some hospitals and breastfeeding support groups lease breast pumps on a monthly basis. A lactation consultant can teach you how to hand express breast milk, if you prefer not to use a pump. CARING FOR YOUR BREASTS WHILE YOU BREASTFEED Nipples can become dry, cracked, and sore while breastfeeding. The following recommendations can help keep your breasts moisturized and healthy:  Avoid using soap on your  nipples.  Wear a supportive bra. Although not required, special nursing bras and tank tops are designed to allow access to your breasts for breastfeeding without taking off your entire bra or top. Avoid wearing underwire-style bras or extremely tight bras.  Air dry your nipples for 3-80minutes after each feeding.  Use only cotton bra pads to absorb leaked breast milk. Leaking of breast milk between feedings is normal.  Use lanolin on your nipples after breastfeeding. Lanolin helps to maintain your  skin's normal moisture barrier. If you use pure lanolin, you do not need to wash it off before feeding your baby again. Pure lanolin is not toxic to your baby. You may also hand express a few drops of breast milk and gently massage that milk into your nipples and allow the milk to air dry. In the first few weeks after giving birth, some women experience extremely full breasts (engorgement). Engorgement can make your breasts feel heavy, warm, and tender to the touch. Engorgement peaks within 3-5 days after you give birth. The following recommendations can help ease engorgement:  Completely empty your breasts while breastfeeding or pumping. You may want to start by applying warm, moist heat (in the shower or with warm water-soaked hand towels) just before feeding or pumping. This increases circulation and helps the milk flow. If your baby does not completely empty your breasts while breastfeeding, pump any extra milk after he or she is finished.  Wear a snug bra (nursing or regular) or tank top for 1-2 days to signal your body to slightly decrease milk production.  Apply ice packs to your breasts, unless this is too uncomfortable for you.  Make sure that your baby is latched on and positioned properly while breastfeeding. If engorgement persists after 48 hours of following these recommendations, contact your health care provider or a Science writer. OVERALL HEALTH CARE RECOMMENDATIONS WHILE  BREASTFEEDING  Eat healthy foods. Alternate between meals and snacks, eating 3 of each per day. Because what you eat affects your breast milk, some of the foods may make your baby more irritable than usual. Avoid eating these foods if you are sure that they are negatively affecting your baby.  Drink milk, fruit juice, and water to satisfy your thirst (about 10 glasses a day).  Rest often, relax, and continue to take your prenatal vitamins to prevent fatigue, stress, and anemia.  Continue breast self-awareness checks.  Avoid chewing and smoking tobacco. Chemicals from cigarettes that pass into breast milk and exposure to secondhand smoke may harm your baby.  Avoid alcohol and drug use, including marijuana. Some medicines that may be harmful to your baby can pass through breast milk. It is important to ask your health care provider before taking any medicine, including all over-the-counter and prescription medicine as well as vitamin and herbal supplements. It is possible to become pregnant while breastfeeding. If birth control is desired, ask your health care provider about options that will be safe for your baby. SEEK MEDICAL CARE IF:  You feel like you want to stop breastfeeding or have become frustrated with breastfeeding.  You have painful breasts or nipples.  Your nipples are cracked or bleeding.  Your breasts are red, tender, or warm.  You have a swollen area on either breast.  You have a fever or chills.  You have nausea or vomiting.  You have drainage other than breast milk from your nipples.  Your breasts do not become full before feedings by the fifth day after you give birth.  You feel sad and depressed.  Your baby is too sleepy to eat well.  Your baby is having trouble sleeping.   Your baby is wetting less than 3 diapers in a 24-hour period.  Your baby has less than 3 stools in a 24-hour period.  Your baby's skin or the white part of his or her eyes becomes  yellow.   Your baby is not gaining weight by 86 days of age. SEEK IMMEDIATE MEDICAL CARE IF:  Your  baby is overly tired (lethargic) and does not want to wake up and feed.  Your baby develops an unexplained fever.   This information is not intended to replace advice given to you by your health care provider. Make sure you discuss any questions you have with your health care provider.   Document Released: 06/05/2005 Document Revised: 02/24/2015 Document Reviewed: 11/27/2012 Elsevier Interactive Patient Education 2016 [employer] Urinary Tract Infection Urinary tract infections (UTIs) can develop anywhere along your urinary tract. Your urinary tract is your body's drainage system for removing wastes and extra water. Your urinary tract includes two kidneys, two ureters, a bladder, and a urethra. Your kidneys are a pair of bean-shaped organs. Each kidney is about the size of your fist. They are located below your ribs, one on each side of your spine. CAUSES Infections are caused by microbes, which are microscopic organisms, including fungi, viruses, and bacteria. These organisms are so small that they can only be seen through a microscope. Bacteria are the microbes that most commonly cause UTIs. SYMPTOMS  Symptoms of UTIs may vary by age and gender of the patient and by the location of the infection. Symptoms in young women typically include a frequent and intense urge to urinate and a painful, burning feeling in the bladder or urethra during urination. Older women and men are more likely to be tired, shaky, and weak and have muscle aches and abdominal pain. A fever may mean the infection is in your kidneys. Other symptoms of a kidney infection include pain in your back or sides below the ribs, nausea, and vomiting. DIAGNOSIS To diagnose a UTI, your caregiver will ask you about your symptoms. Your caregiver will also ask you to provide a urine sample. The urine sample will be tested for bacteria  and white blood cells. White blood cells are made by your body to help fight infection. TREATMENT  Typically, UTIs can be treated with medication. Because most UTIs are caused by a bacterial infection, they usually can be treated with the use of antibiotics. The choice of antibiotic and length of treatment depend on your symptoms and the type of bacteria causing your infection. HOME CARE INSTRUCTIONS  If you were prescribed antibiotics, take them exactly as your caregiver instructs you. Finish the medication even if you feel better after you have only taken some of the medication.  Drink enough water and fluids to keep your urine clear or pale yellow.  Avoid caffeine, tea, and carbonated beverages. They tend to irritate your bladder.  Empty your bladder often. Avoid holding urine for long periods of time.  Empty your bladder before and after sexual intercourse.  After a bowel movement, women should cleanse from front to back. Use each tissue only once. SEEK MEDICAL CARE IF:   You have back pain.  You develop a fever.  Your symptoms do not begin to resolve within 3 days. SEEK IMMEDIATE MEDICAL CARE IF:   You have severe back pain or lower abdominal pain.  You develop chills.  You have nausea or vomiting.  You have continued burning or discomfort with urination. MAKE SURE YOU:   Understand these instructions.  Will watch your condition.  Will get help right away if you are not doing well or get worse.   This information is not intended to replace advice given to you by your health care provider. Make sure you discuss any questions you have with your health care provider.   Document Released: 03/15/2005 Document Revised: 02/24/2015 Document  Reviewed: 07/14/2011 Elsevier Interactive Patient Education [employer].

## 2015-06-29 NOTE — Progress Notes (Signed)
Subjective:  [name] is a 30 y.o. G3P1011 at [redacted]w[redacted]d being seen today for ongoing prenatal care.  She is currently monitored for the following issues for this high-risk pregnancy and has Supervision of normal pregnancy; Previous cesarean delivery affecting pregnancy, antepartum; Vaginal bleeding during pregnancy, antepartum; Trichomonal vaginitis during pregnancy; Urinary tract infection during pregnancy in third trimester, antepartum; and Gestational diabetes mellitus (GDM), antepartum on her problem list.  Patient reports contractions since 2 days, sometimes every 5 mins, sometimes every 2 hours.  Contractions: Irregular. Vag. Bleeding: None.  Movement: Present. Denies leaking of fluid.   The following portions of the patient's history were reviewed and updated as appropriate: allergies, current medications, past family history, past medical history, past social history, past surgical history and problem list. Problem list updated.  Objective:   Filed Vitals:   06/29/15 1339  BP: 109/76  Pulse: 108  Weight: 212 lb (96.163 kg)    Fetal Status: Fetal Heart Rate (bpm): 155   Movement: Present     General:  Alert, oriented and cooperative. Patient is in no acute distress.  Skin: Skin is warm and dry. No rash noted.   Cardiovascular: Normal heart rate noted  Respiratory: Normal respiratory effort, no problems with respiration noted  Abdomen: Soft, gravid, appropriate for gestational age. Pain/Pressure: Present     Pelvic: Vag. Bleeding: None Vag D/C Character: Thin   Cervical exam deferred        Extremities: Normal range of motion.  Edema: None  Mental Status: Normal mood and affect. Normal behavior. Normal judgment and thought content.   Urinalysis    Component Value Date/Time   COLORURINE YELLOW 06/04/2015 1805   APPEARANCEUR CLEAR 06/04/2015 1805   LABSPEC 1.025 06/04/2015 1805   PHURINE 7.0 06/04/2015 1805   GLUCOSEU NEGATIVE 06/04/2015 1805   HGBUR SMALL* 06/04/2015  1805   HGBUR negative 11/28/2009 1336   BILIRUBINUR NEG 06/29/2015 [place] 06/04/2015 1805   KETONESUR NEGATIVE 06/04/2015 1805   PROTEINUR 1+ 06/29/2015 1347   PROTEINUR NEGATIVE 06/04/2015 1805   UROBILINOGEN negative 06/29/2015 1347   UROBILINOGEN 0.2 02/26/2015 1850   NITRITE POSITIVE 06/29/2015 1347   NITRITE POSITIVE* 06/04/2015 1805   LEUKOCYTESUR Trace* 06/29/2015 1347       Assessment and Plan:  Pregnancy: G3P1011 at [redacted]w[redacted]d  1. Burning with urination  - POCT Urinalysis Dipstick - Urine Culture - cephALEXin (KEFLEX) 500 MG capsule; Take 1 capsule (500 mg total) by mouth 4 (four) times daily.  Dispense: 28 capsule; Refill: 0  2. Supervision of normal pregnancy, third trimester Continue prenatal care.  3. Diet controlled gestational diabetes Dietary discretion reviewed.  Preterm labor symptoms and general obstetric precautions including but not limited to vaginal bleeding, contractions, leaking of fluid and fetal movement were reviewed in detail with the patient. Please refer to After Visit Summary for other counseling recommendations.  Return in 1 week (on 07/06/2015).   Donnamae Jude, MD

## 2015-07-01 ENCOUNTER — Other Ambulatory Visit: Payer: Medicaid Other

## 2015-07-01 ENCOUNTER — Inpatient Hospital Stay (HOSPITAL_COMMUNITY)
Admission: AD | Admit: 2015-07-01 | Discharge: 2015-07-04 | DRG: 781 | Disposition: A | Discharge status: Home / Self Care | Payer: Medicaid Other | Source: Ambulatory Visit | Attending: Obstetrics & Gynecology | Admitting: Obstetrics & Gynecology

## 2015-07-01 ENCOUNTER — Encounter (HOSPITAL_COMMUNITY): Payer: Self-pay | Admitting: *Deleted

## 2015-07-01 DIAGNOSIS — Z87891 Personal history of nicotine dependence: Secondary | ICD-10-CM

## 2015-07-01 DIAGNOSIS — Z3A35 35 weeks gestation of pregnancy: Secondary | ICD-10-CM

## 2015-07-01 DIAGNOSIS — K219 Gastro-esophageal reflux disease without esophagitis: Secondary | ICD-10-CM | POA: Diagnosis present

## 2015-07-01 DIAGNOSIS — Z833 Family history of diabetes mellitus: Secondary | ICD-10-CM

## 2015-07-01 DIAGNOSIS — R300 Dysuria: Secondary | ICD-10-CM

## 2015-07-01 DIAGNOSIS — Z8249 Family history of ischemic heart disease and other diseases of the circulatory system: Secondary | ICD-10-CM

## 2015-07-01 DIAGNOSIS — O2303 Infections of kidney in pregnancy, third trimester: Secondary | ICD-10-CM | POA: Diagnosis present

## 2015-07-01 DIAGNOSIS — O99613 Diseases of the digestive system complicating pregnancy, third trimester: Secondary | ICD-10-CM | POA: Diagnosis present

## 2015-07-01 DIAGNOSIS — N12 Tubulo-interstitial nephritis, not specified as acute or chronic: Secondary | ICD-10-CM | POA: Diagnosis not present

## 2015-07-01 DIAGNOSIS — R109 Unspecified abdominal pain: Secondary | ICD-10-CM | POA: Diagnosis not present

## 2015-07-01 LAB — URINALYSIS, ROUTINE W REFLEX MICROSCOPIC
BILIRUBIN URINE: NEGATIVE
GLUCOSE, UA: NEGATIVE mg/dL
KETONES UR: 40 mg/dL — AB
Nitrite: POSITIVE — AB
PH: 7 (ref 5.0–8.0)
PROTEIN: NEGATIVE mg/dL
Specific Gravity, Urine: 1.02 (ref 1.005–1.030)

## 2015-07-01 LAB — TYPE AND SCREEN
ABO/RH(D): A POS
Antibody Screen: NEGATIVE

## 2015-07-01 LAB — COMPREHENSIVE METABOLIC PANEL
ALBUMIN: 2.6 g/dL — AB (ref 3.5–5.0)
ALT: 10 U/L — AB (ref 14–54)
ANION GAP: 11 (ref 5–15)
AST: 22 U/L (ref 15–41)
Alkaline Phosphatase: 130 U/L — ABNORMAL HIGH (ref 38–126)
BUN: 6 mg/dL (ref 6–20)
CHLORIDE: 106 mmol/L (ref 101–111)
CO2: 18 mmol/L — AB (ref 22–32)
Calcium: 8.6 mg/dL — ABNORMAL LOW (ref 8.9–10.3)
Creatinine, Ser: 0.39 mg/dL — ABNORMAL LOW (ref 0.44–1.00)
GFR calc non Af Amer: >60 mL/min (ref >60)
Glucose, Bld: 99 mg/dL (ref 65–99)
Potassium: 3.7 mmol/L (ref 3.5–5.1)
SODIUM: 135 mmol/L (ref 135–145)
Total Bilirubin: 0.4 mg/dL (ref 0.3–1.2)
Total Protein: 6.4 g/dL — ABNORMAL LOW (ref 6.5–8.1)

## 2015-07-01 LAB — GLUCOSE, CAPILLARY
GLUCOSE-CAPILLARY: 85 mg/dL (ref 65–99)
Glucose-Capillary: 85 mg/dL (ref 65–99)
Glucose-Capillary: 98 mg/dL (ref 65–99)

## 2015-07-01 LAB — CBC
HCT: 29.8 % — ABNORMAL LOW (ref 36.0–46.0)
Hemoglobin: 9.4 g/dL — ABNORMAL LOW (ref 12.0–15.0)
MCH: 25.2 pg — AB (ref 26.0–34.0)
MCHC: 31.5 g/dL (ref 30.0–36.0)
MCV: 79.9 fL (ref 78.0–100.0)
PLATELETS: 184 10*3/uL (ref 150–400)
RBC: 3.73 MIL/uL — AB (ref 3.87–5.11)
RDW: 16.6 % — ABNORMAL HIGH (ref 11.5–15.5)
WBC: 14.9 10*3/uL — ABNORMAL HIGH (ref 4.0–10.5)

## 2015-07-01 LAB — URINE CULTURE

## 2015-07-01 LAB — URINE MICROSCOPIC-ADD ON

## 2015-07-01 MED ORDER — FAMOTIDINE IN NACL 20-0.9 MG/50ML-% IV SOLN
20.0000 mg | Freq: Two times a day (BID) | INTRAVENOUS | Status: AC
  Administered 2015-07-01: 20 mg via INTRAVENOUS
  Filled 2015-07-01: qty 50

## 2015-07-01 MED ORDER — PANTOPRAZOLE SODIUM 20 MG PO TBEC
20.0000 mg | DELAYED_RELEASE_TABLET | Freq: Every day | ORAL | Status: DC
  Administered 2015-07-02 – 2015-07-03 (×2): 20 mg via ORAL
  Filled 2015-07-01 (×3): qty 1

## 2015-07-01 MED ORDER — LACTATED RINGERS IV BOLUS (SEPSIS): 1000.0000 mL | Freq: Once | INTRAVENOUS | Status: DC

## 2015-07-01 MED ORDER — DEXTROSE 5 % IV SOLN
2.0000 g | INTRAVENOUS | Status: DC
  Administered 2015-07-01 – 2015-07-03 (×3): 2 g via INTRAVENOUS
  Filled 2015-07-01 (×4): qty 2

## 2015-07-01 MED ORDER — SODIUM CHLORIDE 0.9 % IV SOLN
INTRAVENOUS | Status: AC
  Administered 2015-07-01: 10:00:00 via INTRAVENOUS

## 2015-07-01 MED ORDER — DEXTROSE 5 % IV SOLN
2.0000 g | INTRAVENOUS | Status: DC
  Filled 2015-07-01: qty 2

## 2015-07-01 MED ORDER — CALCIUM CARBONATE ANTACID 500 MG PO CHEW: 2.0000 | CHEWABLE_TABLET | ORAL | Status: DC | PRN

## 2015-07-01 MED ORDER — ACETAMINOPHEN 325 MG PO TABS
650.0000 mg | ORAL_TABLET | ORAL | Status: DC | PRN
  Administered 2015-07-01 – 2015-07-02 (×2): 650 mg via ORAL
  Filled 2015-07-01 (×2): qty 2

## 2015-07-01 MED ORDER — PRENATAL MULTIVITAMIN CH
1.0000 | ORAL_TABLET | Freq: Every day | ORAL | Status: DC
Start: 2015-07-01 — End: 2015-07-04
  Administered 2015-07-02: 1 via ORAL
  Filled 2015-07-01: qty 1

## 2015-07-01 MED ORDER — ZOLPIDEM TARTRATE 5 MG PO TABS: 5.0000 mg | ORAL_TABLET | Freq: Every evening | ORAL | Status: DC | PRN

## 2015-07-01 MED ORDER — DOCUSATE SODIUM 100 MG PO CAPS
100.0000 mg | ORAL_CAPSULE | Freq: Every day | ORAL | Status: DC
  Administered 2015-07-02 – 2015-07-03 (×2): 100 mg via ORAL
  Filled 2015-07-01 (×2): qty 1

## 2015-07-01 MED ORDER — FENTANYL CITRATE (PF) 100 MCG/2ML IJ SOLN: 50.0000 ug | Freq: Once | INTRAMUSCULAR | Status: DC

## 2015-07-01 MED ORDER — SODIUM CHLORIDE 0.9 % IV SOLN
INTRAVENOUS | Status: DC
  Administered 2015-07-01 – 2015-07-03 (×3): via INTRAVENOUS

## 2015-07-01 NOTE — H&P (Signed)
CSN: [id number]  Arrival date and time: 07/01/15 0849   None     No chief complaint on file.  HPI    Ms. [name] is a G3P1011 at [redacted]w[redacted]d gestation with recent diagnosis of GDM and UTI and PMHx of kidney stones who presents with worsening abdominal and back pain.  Patient states she was diagnosed with UTI 06/29/2015 and prescribed an antibiotic, however was unable to fill the prescription due to financial reasons.  She is now having increased abdominal and back pain.  She endorses nausea with 1 episode of vomiting yesterday. States she had a fever at 100.36F with chills yesterday evening which broke with Tylenol.  She rates her abdominal pain as 8/10, sharp in nature, concentrated in the RUQ which is worse with eating. The pain radiates to her right upper back. She rates her back pain as a constant 5/10 which only slightly improves with rest.   Endorses chills, fatigue, urinary urgency and frequency.  Denies chest pain, shortness of breath, hematuria, changes in bowel habits.   OB History    Gravida Para Term Preterm AB TAB SAB Ectopic Multiple Living   3 1 1  1  1   1       Past Medical History  Diagnosis Date  . Kidney stones   . Kidney stones   . Kidney stones   . Uterine fibroid   . Migraine   . Gestational diabetes mellitus (GDM), antepartum     Past Surgical History  Procedure Laterality Date  . Renal artery stent      placed and removed in 2005  . Cesarean section      Nov 2011    Family History  Problem Relation Age of Onset  . Hypertension Mother   . Depression Mother   . Diabetes Father   . Hypertension Father   . Heart disease Father   . Cancer Father     foot    Social History  Substance Use Topics  . Smoking status: Former Research scientist (life sciences)  . Smokeless tobacco: None  . Alcohol Use: No    Allergies:  Allergies  Allergen Reactions  . Erythromycin Base Anaphylaxis  . Ciprofloxacin Nausea And Vomiting  . Latex Other (See Comments)    Causes irritation.   Cristopher [place] Flavor Other (See Comments)    REACTION: MIGRAINES   . Erythromycin Hives and Rash    Prescriptions prior to admission  Medication Sig Dispense Refill Last Dose  . ACCU-CHEK FASTCLIX LANCETS MISC Use as directed 1 each 8 Taking  . acetaminophen (TYLENOL) 500 MG tablet Take 1,000 mg by mouth every 6 (six) hours as needed for moderate pain.   Taking  . calcium carbonate (TUMS - DOSED IN MG ELEMENTAL CALCIUM) 500 MG chewable tablet Chew 1 tablet by mouth daily.   Taking  . cephALEXin (KEFLEX) 500 MG capsule Take 1 capsule (500 mg total) by mouth 4 (four) times daily. 28 capsule 0   . famotidine (PEPCID) 20 MG tablet Take 1 tablet (20 mg total) by mouth 2 (two) times daily. 30 tablet 0 Taking  . glucose blood test strip Use as instructed 100 each 12 Taking   Results for orders placed or performed during the hospital encounter of 07/01/15 (from the past 48 hour(s))  Urinalysis, Routine w reflex microscopic (not at [facility])     Status: Abnormal   Collection Time: 07/01/15  9:00 AM  Result Value Ref Range   Color, Urine YELLOW YELLOW   APPearance CLOUDY (A)  CLEAR   Specific Gravity, Urine 1.020 1.005 - 1.030   pH 7.0 5.0 - 8.0   Glucose, UA NEGATIVE NEGATIVE mg/dL   Hgb urine dipstick TRACE (A) NEGATIVE   Bilirubin Urine NEGATIVE NEGATIVE   Ketones, ur 40 (A) NEGATIVE mg/dL   Protein, ur NEGATIVE NEGATIVE mg/dL   Nitrite POSITIVE (A) NEGATIVE   Leukocytes, UA SMALL (A) NEGATIVE  Urine microscopic-add on     Status: Abnormal   Collection Time: 07/01/15  9:00 AM  Result Value Ref Range   Squamous Epithelial / LPF 6-30 (A) NONE SEEN   WBC, UA 6-30 0 - 5 WBC/hpf   RBC / HPF 0-5 0 - 5 RBC/hpf   Bacteria, UA MANY (A) NONE SEEN  CBC     Status: Abnormal   Collection Time: 07/01/15 10:15 AM  Result Value Ref Range   WBC 14.9 (H) 4.0 - 10.5 K/uL   RBC 3.73 (L) 3.87 - 5.11 MIL/uL   Hemoglobin 9.4 (L) 12.0 - 15.0 g/dL   HCT 29.8 (L) 36.0 - 46.0 %   MCV 79.9 78.0 - 100.0 fL   MCH  25.2 (L) 26.0 - 34.0 pg   MCHC 31.5 30.0 - 36.0 g/dL   RDW 16.6 (H) 11.5 - 15.5 %   Platelets 184 150 - 400 K/uL    Review of Systems  Constitutional: Positive for fever and chills.  Gastrointestinal: Positive for nausea, vomiting and abdominal pain (Upper right abdominal pain ).  Genitourinary: Positive for flank pain. Negative for hematuria.  Musculoskeletal: Positive for back pain.   Physical Exam   Blood pressure 121/73, pulse 105, temperature 97.6 F (36.4 C), resp. rate 18, last menstrual period 10/26/2014.  Physical Exam  Constitutional: She is oriented to person, place, and time. She appears well-developed and well-nourished. No distress.  HENT:  Head: Normocephalic and atraumatic.  Cardiovascular: Regular rhythm and normal heart sounds.  Tachycardia present.   Respiratory: Effort normal and breath sounds normal. No respiratory distress.  GI: Soft. Bowel sounds are normal. She exhibits no distension. There is tenderness (in RUQ, worse with deep palpation). There is CVA tenderness. There is no rebound.  Neurological: She is alert and oriented to person, place, and time.  Skin: Skin is warm and dry.  Psychiatric: She has a normal mood and affect. Her behavior is normal.    MAU Course  Procedures  None  MDM Patient with worsening abdominal and back pain. Recently diagnosed with UTI 1/10, not taking antibiotics prescribed.  Obtain UA with culture, CBC, CMP. UA shows presence of WBC, nitrites, ketones 40.   Discussed patient with Dr. Ihor Dow; Will admit for IV antibiotics Urine culture.   Assessment and Plan  Pyleonephritis in the third trimester of pregnancy -Admit to Women's unit -Initiate IVF, NS @ 50 ML/Hr, following 1 liter bolus due to ketonuria.  -Fentanyl 50 mcg injection for pain -Start cetriaxone 2 gram per 24 hours as recommended by pharmacy.   GERD -Pepcid IV 20 mg BID today -Plan to transition to Protonix 20 mg tomorrow     PA student  attestation:   I have seen and examined this patient; I agree with above documentation in the PA student's note.   [name] is a 30 y.o. G3P1011 reporting UTI symptoms; back pain, right upper quadrant pain, dysuria, nausea and vomiting.  +FM, denies LOF, VB, contractions, vaginal discharge.  + fever/chills  PE: BP 121/73 mmHg  Pulse 105  Temp(Src) 97.6 F (36.4 C)  Resp 18  LMP 10/26/2014 (Exact Date) Gen: calm comfortable, NAD Resp: normal effort, no distress Abd: gravid GI: + right CVA tenderness   ROS, labs, PMH reviewed NST reactive   Assessment:  Pyelonephritis; third trimester or pregnancy    Plan:  Admit to Women's unit for IV antibiotics    Lezlie Lye, NP 07/01/2015 10:59 AM

## 2015-07-01 NOTE — MAU Note (Signed)
Pt presents to MAU with complaints of pain in her lower abdomen with lower back pain. PT states she was diagnosed with a UTI on Tuesday but has not been able to afford medications so she has not taken her medications prescribed

## 2015-07-01 NOTE — MAU Provider Note (Signed)
History     CSN: [id number]  Arrival date and time: 07/01/15 0849   None     No chief complaint on file.  HPI    Ms. [name] is a G3P1011 at [redacted]w[redacted]d gestation with recent diagnosis of GDM and UTI and PMHx of kidney stones who presents with worsening abdominal and back pain.  Patient states she was diagnosed with UTI 06/29/2015 and prescribed an antibiotic, however was unable to fill the prescription due to financial reasons.  She is now having increased abdominal and back pain.  She endorses nausea with 1 episode of vomiting yesterday. States she had a fever at 100.2F with chills yesterday evening which broke with Tylenol.  She rates her abdominal pain as 8/10, sharp in nature, concentrated in the RUQ which is worse with eating. The pain radiates to her right upper back. She rates her back pain as a constant 5/10 which only slightly improves with rest.   Endorses chills, fatigue, urinary urgency and frequency.  Denies chest pain, shortness of breath, hematuria, changes in bowel habits.   OB History    Gravida Para Term Preterm AB TAB SAB Ectopic Multiple Living   3 1 1  1  1   1       Past Medical History  Diagnosis Date  . Kidney stones   . Kidney stones   . Kidney stones   . Uterine fibroid   . Migraine   . Gestational diabetes mellitus (GDM), antepartum     Past Surgical History  Procedure Laterality Date  . Renal artery stent      placed and removed in 2005  . Cesarean section      Nov 2011    Family History  Problem Relation Age of Onset  . Hypertension Mother   . Depression Mother   . Diabetes Father   . Hypertension Father   . Heart disease Father   . Cancer Father     foot    Social History  Substance Use Topics  . Smoking status: Former Research scientist (life sciences)  . Smokeless tobacco: None  . Alcohol Use: No    Allergies:  Allergies  Allergen Reactions  . Erythromycin Base Anaphylaxis  . Ciprofloxacin Nausea And Vomiting  . Latex Other (See Comments)     Causes irritation.  Cristopher [place] Flavor Other (See Comments)    REACTION: MIGRAINES   . Erythromycin Hives and Rash    Prescriptions prior to admission  Medication Sig Dispense Refill Last Dose  . ACCU-CHEK FASTCLIX LANCETS MISC Use as directed 1 each 8 Taking  . acetaminophen (TYLENOL) 500 MG tablet Take 1,000 mg by mouth every 6 (six) hours as needed for moderate pain.   Taking  . calcium carbonate (TUMS - DOSED IN MG ELEMENTAL CALCIUM) 500 MG chewable tablet Chew 1 tablet by mouth daily.   Taking  . cephALEXin (KEFLEX) 500 MG capsule Take 1 capsule (500 mg total) by mouth 4 (four) times daily. 28 capsule 0   . famotidine (PEPCID) 20 MG tablet Take 1 tablet (20 mg total) by mouth 2 (two) times daily. 30 tablet 0 Taking  . glucose blood test strip Use as instructed 100 each 12 Taking   Results for orders placed or performed during the hospital encounter of 07/01/15 (from the past 48 hour(s))  Urinalysis, Routine w reflex microscopic (not at [facility])     Status: Abnormal   Collection Time: 07/01/15  9:00 AM  Result Value Ref Range   Color, Urine YELLOW YELLOW  APPearance CLOUDY (A) CLEAR   Specific Gravity, Urine 1.020 1.005 - 1.030   pH 7.0 5.0 - 8.0   Glucose, UA NEGATIVE NEGATIVE mg/dL   Hgb urine dipstick TRACE (A) NEGATIVE   Bilirubin Urine NEGATIVE NEGATIVE   Ketones, ur 40 (A) NEGATIVE mg/dL   Protein, ur NEGATIVE NEGATIVE mg/dL   Nitrite POSITIVE (A) NEGATIVE   Leukocytes, UA SMALL (A) NEGATIVE  Urine microscopic-add on     Status: Abnormal   Collection Time: 07/01/15  9:00 AM  Result Value Ref Range   Squamous Epithelial / LPF 6-30 (A) NONE SEEN   WBC, UA 6-30 0 - 5 WBC/hpf   RBC / HPF 0-5 0 - 5 RBC/hpf   Bacteria, UA MANY (A) NONE SEEN  CBC     Status: Abnormal   Collection Time: 07/01/15 10:15 AM  Result Value Ref Range   WBC 14.9 (H) 4.0 - 10.5 K/uL   RBC 3.73 (L) 3.87 - 5.11 MIL/uL   Hemoglobin 9.4 (L) 12.0 - 15.0 g/dL   HCT 29.8 (L) 36.0 - 46.0 %   MCV 79.9  78.0 - 100.0 fL   MCH 25.2 (L) 26.0 - 34.0 pg   MCHC 31.5 30.0 - 36.0 g/dL   RDW 16.6 (H) 11.5 - 15.5 %   Platelets 184 150 - 400 K/uL    Review of Systems  Constitutional: Positive for fever and chills.  Gastrointestinal: Positive for nausea, vomiting and abdominal pain (Upper right abdominal pain ).  Genitourinary: Positive for flank pain. Negative for hematuria.  Musculoskeletal: Positive for back pain.   Physical Exam   Blood pressure 121/73, pulse 105, temperature 97.6 F (36.4 C), resp. rate 18, last menstrual period 10/26/2014.  Physical Exam  Constitutional: She is oriented to person, place, and time. She appears well-developed and well-nourished. No distress.  HENT:  Head: Normocephalic and atraumatic.  Cardiovascular: Regular rhythm and normal heart sounds.  Tachycardia present.   Respiratory: Effort normal and breath sounds normal. No respiratory distress.  GI: Soft. Bowel sounds are normal. She exhibits no distension. There is tenderness (in RUQ, worse with deep palpation). There is CVA tenderness. There is no rebound.  Neurological: She is alert and oriented to person, place, and time.  Skin: Skin is warm and dry.  Psychiatric: She has a normal mood and affect. Her behavior is normal.    MAU Course  Procedures  None  MDM Patient with worsening abdominal and back pain. Recently diagnosed with UTI 1/10, not taking antibiotics prescribed.  Obtain UA with culture, CBC, CMP. UA shows presence of WBC, nitrites, ketones 40.   Discussed patient with Dr. Ihor Dow; Will admit for IV antibiotics Urine culture.   Assessment and Plan  Pyleonephritis in the third trimester of pregnancy -Admit to Women's unit -Initiate IVF, NS @ 50 ML/Hr, following 1 liter bolus due to ketonuria.  -Fentanyl 50 mcg injection for pain -Start cetriaxone 2 gram per 24 hours as recommended by pharmacy.   GERD -Pepcid IV 20 mg BID today -Plan to transition to Protonix 20 mg  tomorrow     PA student attestation:   I have seen and examined this patient; I agree with above documentation in the PA student's note.   [name] is a 30 y.o. G3P1011 reporting UTI symptoms; back pain, right upper quadrant pain, dysuria, nausea and vomiting.  +FM, denies LOF, VB, contractions, vaginal discharge.  + fever/chills  PE: BP 121/73 mmHg  Pulse 105  Temp(Src) 97.6 F (36.4 C)  Resp 18  LMP 10/26/2014 (Exact Date) Gen: calm comfortable, NAD Resp: normal effort, no distress Abd: gravid GI: + right CVA tenderness   ROS, labs, PMH reviewed NST reactive   Assessment:  Pyelonephritis; third trimester or pregnancy    Plan:  Admit to Women's unit for IV antibiotics    Lezlie Lye, NP 07/01/2015 10:59 AM

## 2015-07-01 NOTE — MAU Note (Signed)
Pt states she had temp of 100.3 last night, took tylenol.  Pt also started vomiting every time she eats, started last night, no diarrhea.  Having pain in R mid abdomen as well as her back.

## 2015-07-02 DIAGNOSIS — K219 Gastro-esophageal reflux disease without esophagitis: Secondary | ICD-10-CM

## 2015-07-02 DIAGNOSIS — Z3A35 35 weeks gestation of pregnancy: Secondary | ICD-10-CM

## 2015-07-02 DIAGNOSIS — Z87891 Personal history of nicotine dependence: Secondary | ICD-10-CM

## 2015-07-02 DIAGNOSIS — O99613 Diseases of the digestive system complicating pregnancy, third trimester: Secondary | ICD-10-CM

## 2015-07-02 DIAGNOSIS — O2303 Infections of kidney in pregnancy, third trimester: Secondary | ICD-10-CM

## 2015-07-02 LAB — GLUCOSE, CAPILLARY
GLUCOSE-CAPILLARY: 89 mg/dL (ref 65–99)
Glucose-Capillary: 106 mg/dL — ABNORMAL HIGH (ref 65–99)
Glucose-Capillary: 95 mg/dL (ref 65–99)
Glucose-Capillary: 121 mg/dL — ABNORMAL HIGH (ref 65–99)

## 2015-07-02 NOTE — Progress Notes (Signed)
Patient ID: [name], female   DOB: July 11, 1985, 30 y.o.   MRN: [id number]  [name]) NOTE  [name] is a 30 y.o. G3P1011 at [redacted]w[redacted]d  who is admitted for pyelonephritis.   Length of Stay:  1  Days  Subjective: Pt still reports right CVA pain. Patient reports the fetal movement as active. Patient reports uterine contraction  activity as none. Patient reports  vaginal bleeding as none. Patient describes fluid per vagina as None.  Vitals:  Blood pressure 126/60, pulse 105, temperature 98.2 F (36.8 C), temperature source Oral, resp. rate 18, height 5\' 5"  (1.651 m), weight 207 lb 0.4 oz (93.906 kg), last menstrual period 10/26/2014, SpO2 99 %. Physical Examination:  General appearance - alert, well appearing, and in no distress Abdomen - gravid, non tender Extremities - no edema, redness or tenderness in the calves or thighs, Homan's sign negative bilaterally + Right CVA tenderness  Fetal Monitoring:  Baseline: 140 bpm, Variability: Good {> 6 bpm), Accelerations: Reactive and Decelerations: Absent  (NST from 07/01/15 and 07/02/15  Labs:  Results for orders placed or performed during the hospital encounter of 07/01/15 (from the past 24 hour(s))  Urinalysis, Routine w reflex microscopic (not at [facility])   Collection Time: 07/01/15  9:00 AM  Result Value Ref Range   Color, Urine YELLOW YELLOW   APPearance CLOUDY (A) CLEAR   Specific Gravity, Urine 1.020 1.005 - 1.030   pH 7.0 5.0 - 8.0   Glucose, UA NEGATIVE NEGATIVE mg/dL   Hgb urine dipstick TRACE (A) NEGATIVE   Bilirubin Urine NEGATIVE NEGATIVE   Ketones, ur 40 (A) NEGATIVE mg/dL   Protein, ur NEGATIVE NEGATIVE mg/dL   Nitrite POSITIVE (A) NEGATIVE   Leukocytes, UA SMALL (A) NEGATIVE  Urine microscopic-add on   Collection Time: 07/01/15  9:00 AM  Result Value Ref Range   Squamous Epithelial / LPF 6-30 (A) NONE SEEN   WBC, UA 6-30 0 - 5 WBC/hpf   RBC / HPF 0-5 0 - 5 RBC/hpf   Bacteria,  UA MANY (A) NONE SEEN  CBC   Collection Time: 07/01/15 10:15 AM  Result Value Ref Range   WBC 14.9 (H) 4.0 - 10.5 K/uL   RBC 3.73 (L) 3.87 - 5.11 MIL/uL   Hemoglobin 9.4 (L) 12.0 - 15.0 g/dL   HCT 29.8 (L) 36.0 - 46.0 %   MCV 79.9 78.0 - 100.0 fL   MCH 25.2 (L) 26.0 - 34.0 pg   MCHC 31.5 30.0 - 36.0 g/dL   RDW 16.6 (H) 11.5 - 15.5 %   Platelets 184 150 - 400 K/uL  Comprehensive metabolic panel   Collection Time: 07/01/15 10:15 AM  Result Value Ref Range   Sodium 135 135 - 145 mmol/L   Potassium 3.7 3.5 - 5.1 mmol/L   Chloride 106 101 - 111 mmol/L   CO2 18 (L) 22 - 32 mmol/L   Glucose, Bld 99 65 - 99 mg/dL   BUN 6 6 - 20 mg/dL   Creatinine, Ser 0.39 (L) 0.44 - 1.00 mg/dL   Calcium 8.6 (L) 8.9 - 10.3 mg/dL   Total Protein 6.4 (L) 6.5 - 8.1 g/dL   Albumin 2.6 (L) 3.5 - 5.0 g/dL   AST 22 15 - 41 U/L   ALT 10 (L) 14 - 54 U/L   Alkaline Phosphatase 130 (H) 38 - 126 U/L   Total Bilirubin 0.4 0.3 - 1.2 mg/dL   GFR calc non Af Amer >60 >60 mL/min  GFR calc Af Amer >60 >60 mL/min   Anion gap 11 5 - 15  Type and screen [place]   Collection Time: 07/01/15 11:25 AM  Result Value Ref Range   ABO/RH(D) A POS    Antibody Screen NEG    Sample Expiration 07/04/2015   Glucose, capillary   Collection Time: 07/01/15 12:07 PM  Result Value Ref Range   Glucose-Capillary 85 65 - 99 mg/dL  Glucose, capillary   Collection Time: 07/01/15  5:43 PM  Result Value Ref Range   Glucose-Capillary 85 65 - 99 mg/dL  Glucose, capillary   Collection Time: 07/01/15 10:31 PM  Result Value Ref Range   Glucose-Capillary 98 65 - 99 mg/dL     Medications:  Scheduled . cefTRIAXone (ROCEPHIN)  IV  2 g Intravenous Q24H  . docusate sodium  100 mg Oral Daily  . famotidine (PEPCID) IV  20 mg Intravenous Q12H  . fentaNYL (SUBLIMAZE) injection  50 mcg Intravenous Once  . pantoprazole  20 mg Oral Daily  . prenatal multivitamin  1 tablet Oral Q1200   I have reviewed the patient's  current medications.  ASSESSMENT: Patient Active Problem List   Diagnosis Date Noted  . Pyelonephritis affecting pregnancy in third trimester 07/01/2015  . Esophageal reflux   . Gestational diabetes mellitus (GDM), antepartum 06/10/2015  . Trichomonal vaginitis during pregnancy 05/22/2015  . Urinary tract infection during pregnancy in third trimester, antepartum 05/22/2015  . Vaginal bleeding during pregnancy, antepartum 01/26/2015  . Supervision of normal pregnancy 12/09/2014  . Previous cesarean delivery affecting pregnancy, antepartum 12/09/2014    PLAN: 1- Continue antibiotics until afebrile and without pain.  Discussed importance of oral antibiotics at home followed by suppression. 2-CBGs from 07/01/15 are normal.  Awaiting fasting from today. 3-NST daily  [name] H. 07/02/2015,6:18 AM

## 2015-07-03 LAB — GLUCOSE, CAPILLARY
GLUCOSE-CAPILLARY: 98 mg/dL (ref 65–99)
GLUCOSE-CAPILLARY: 108 mg/dL — AB (ref 65–99)
Glucose-Capillary: 104 mg/dL — ABNORMAL HIGH (ref 65–99)
Glucose-Capillary: 80 mg/dL (ref 65–99)

## 2015-07-03 LAB — URINE CULTURE: SPECIAL REQUESTS: NORMAL

## 2015-07-03 NOTE — Progress Notes (Signed)
Patient ID: [name], female   DOB: 1985-07-28, 30 y.o.   MRN: [id number]  [place]) NOTE  Elanore A Cherlyn Cushing is a 30 y.o. G3P1011 at [redacted]w[redacted]d   who is admitted for pyelonephritis.   Length of Stay:  2  Days  Subjective: Pt reports minimal right CVAT Patient reports the fetal movement as active. Patient reports uterine contraction  activity as none. Patient reports  vaginal bleeding as none. Patient describes fluid per vagina as None.  Vitals:  Blood pressure 103/58, pulse 91, temperature 97.7 F (36.5 C), temperature source Oral, resp. rate 15, height 5\' 5"  (1.651 m), weight 207 lb (93.895 kg), last menstrual period 10/26/2014, SpO2 100 %. Physical Examination:  General appearance - alert, well appearing, and in no distress Abdomen - gravid, non tender Extremities - no edema, redness or tenderness in the calves or thighs, Homan's sign negative bilaterally + Right CVA tenderness  Fetal Monitoring:  Baseline: 140 bpm, Variability: Good {> 6 bpm), Accelerations: Reactive and Decelerations: Absent  (NST from 07/01/15 and 07/02/15  Labs:  Results for orders placed or performed during the hospital encounter of 07/01/15 (from the past 24 hour(s))  Glucose, capillary   Collection Time: 07/02/15 11:55 AM  Result Value Ref Range   Glucose-Capillary 106 (H) 65 - 99 mg/dL  Glucose, capillary   Collection Time: 07/02/15  7:44 PM  Result Value Ref Range   Glucose-Capillary 95 65 - 99 mg/dL  Glucose, capillary   Collection Time: 07/02/15  9:49 PM  Result Value Ref Range   Glucose-Capillary 121 (H) 65 - 99 mg/dL  Glucose, capillary   Collection Time: 07/03/15  6:21 AM  Result Value Ref Range   Glucose-Capillary 98 65 - 99 mg/dL     Medications:  Scheduled . cefTRIAXone (ROCEPHIN)  IV  2 g Intravenous Q24H  . docusate sodium  100 mg Oral Daily  . fentaNYL (SUBLIMAZE) injection  50 mcg Intravenous Once  . pantoprazole  20 mg Oral Daily  . prenatal  multivitamin  1 tablet Oral Q1200   I have reviewed the patient's current medications.  ASSESSMENT: [redacted]w[redacted]d Estimated Date of Delivery: 08/02/15  Pyelonephritis   Patient Active Problem List   Diagnosis Date Noted  . Pyelonephritis affecting pregnancy in third trimester 07/01/2015  . Esophageal reflux   . Gestational diabetes mellitus (GDM), antepartum 06/10/2015  . Trichomonal vaginitis during pregnancy 05/22/2015  . Urinary tract infection during pregnancy in third trimester, antepartum 05/22/2015  . Vaginal bleeding during pregnancy, antepartum 01/26/2015  . Supervision of normal pregnancy 12/09/2014  . Previous cesarean delivery affecting pregnancy, antepartum 12/09/2014    PLAN: 1- 24 more hours of rocephin, should be ready to be discharged tomorrow on orals 2-CBGs from 07/01/15 are normal.  Awaiting fasting from today. 3-NST daily  Florian Buff 07/03/2015,7:21 AM     Patient ID: [name], female   DOB: 1986-05-31, 30 y.o.   MRN: [id number]

## 2015-07-03 NOTE — Progress Notes (Signed)
Pt denies uc's, vaginal bleeding, leaking of fluid. Reports positive fetal movement.

## 2015-07-03 NOTE — Progress Notes (Signed)
Pt instructed to lie on her side rather than her back.

## 2015-07-04 DIAGNOSIS — O2303 Infections of kidney in pregnancy, third trimester: Principal | ICD-10-CM

## 2015-07-04 DIAGNOSIS — N12 Tubulo-interstitial nephritis, not specified as acute or chronic: Secondary | ICD-10-CM

## 2015-07-04 LAB — GLUCOSE, CAPILLARY: Glucose-Capillary: 87 mg/dL (ref 65–99)

## 2015-07-04 MED ORDER — CEPHALEXIN 500 MG PO CAPS
500.0000 mg | ORAL_CAPSULE | Freq: Four times a day (QID) | ORAL | Status: DC
Start: 2015-07-04 — End: 2015-07-28

## 2015-07-04 MED ORDER — RANITIDINE HCL 75 MG PO TABS: 150.0000 mg | ORAL_TABLET | Freq: Two times a day (BID) | ORAL | Status: DC

## 2015-07-04 NOTE — Progress Notes (Signed)

## 2015-07-04 NOTE — Discharge Summary (Signed)
Physician Discharge Summary  Patient ID: [name] MRN: [id number] DOB/AGE: [birth year] 30 y.o.  Admit date: 07/01/2015 Discharge date: 07/04/2015  Admission Diagnoses: Patient Active Problem List   Diagnosis Date Noted  . Pyelonephritis affecting pregnancy in third trimester 07/01/2015  . Esophageal reflux   . Gestational diabetes mellitus (GDM), antepartum 06/10/2015  . Trichomonal vaginitis during pregnancy 05/22/2015  . Urinary tract infection during pregnancy in third trimester, antepartum 05/22/2015  . Vaginal bleeding during pregnancy, antepartum 01/26/2015  . Supervision of normal pregnancy 12/09/2014  . Previous cesarean delivery affecting pregnancy, antepartum 12/09/2014     Discharge Diagnoses: same Principal Problem:   Pyelonephritis affecting pregnancy in third trimester Active Problems:   Esophageal reflux   Discharged Condition: good  Hospital Course:  CSN: [id number]  Arrival date and time: 07/01/15 0849  None    No chief complaint on file.  HPI    Ms. [name] is a G3P1011 at [redacted]w[redacted]d gestation with recent diagnosis of GDM and UTI and PMHx of kidney stones who presents with worsening abdominal and back pain. Patient states she was diagnosed with UTI 06/29/2015 and prescribed an antibiotic, however was unable to fill the prescription due to financial reasons. She is now having increased abdominal and back pain. She endorses nausea with 1 episode of vomiting yesterday. States she had a fever at 100.48F with chills yesterday evening which broke with Tylenol. She rates her abdominal pain as 8/10, sharp in nature, concentrated in the RUQ which is worse with eating. The pain radiates to her right upper back. She rates her back pain as a constant 5/10 which only slightly improves with rest.   Endorses chills, fatigue, urinary urgency and frequency. Denies chest pain, shortness of breath, hematuria, changes in bowel habits.   OB History     Gravida Para Term Preterm AB TAB SAB Ectopic Multiple Living   3 1 1  1  1   1       Past Medical History  Diagnosis Date  . Kidney stones   . Kidney stones   . Kidney stones   . Uterine fibroid   . Migraine   . Gestational diabetes mellitus (GDM), antepartum     Past Surgical History  Procedure Laterality Date  . Renal artery stent      placed and removed in 2005  . Cesarean section      Nov 2011    Family History  Problem Relation Age of Onset  . Hypertension Mother   . Depression Mother   . Diabetes Father   . Hypertension Father   . Heart disease Father   . Cancer Father     foot    Social History  Substance Use Topics  . Smoking status: Former Research scientist (life sciences)  . Smokeless tobacco: None  . Alcohol Use: No    Allergies:  Allergies  Allergen Reactions  . Erythromycin Base Anaphylaxis  . Ciprofloxacin Nausea And Vomiting  . Latex Other (See Comments)    Causes irritation.  Cristopher [place] Flavor Other (See Comments)    REACTION: MIGRAINES   . Erythromycin Hives and Rash    Prescriptions prior to admission  Medication Sig Dispense Refill Last Dose  . ACCU-CHEK FASTCLIX LANCETS MISC Use as directed 1 each 8 Taking  . acetaminophen (TYLENOL) 500 MG tablet Take 1,000 mg by mouth every 6 (six) hours as needed for moderate pain.   Taking  . calcium carbonate (TUMS - DOSED IN MG ELEMENTAL CALCIUM) 500 MG chewable tablet Chew 1  tablet by mouth daily.   Taking  . cephALEXin (KEFLEX) 500 MG capsule Take 1 capsule (500 mg total) by mouth 4 (four) times daily. 28 capsule 0   . famotidine (PEPCID) 20 MG tablet Take 1 tablet (20 mg total) by mouth 2 (two) times daily. 30 tablet 0 Taking  . glucose blood test strip Use as instructed 100 each 12 Taking   Results for orders placed or performed during the  hospital encounter of 07/01/15 (from the past 48 hour(s))  Urinalysis, Routine w reflex microscopic (not at [facility]) Status: Abnormal   Collection Time: 07/01/15 9:00 AM  Result Value Ref Range   Color, Urine YELLOW YELLOW   APPearance CLOUDY (A) CLEAR   Specific Gravity, Urine 1.020 1.005 - 1.030   pH 7.0 5.0 - 8.0   Glucose, UA NEGATIVE NEGATIVE mg/dL   Hgb urine dipstick TRACE (A) NEGATIVE   Bilirubin Urine NEGATIVE NEGATIVE   Ketones, ur 40 (A) NEGATIVE mg/dL   Protein, ur NEGATIVE NEGATIVE mg/dL   Nitrite POSITIVE (A) NEGATIVE   Leukocytes, UA SMALL (A) NEGATIVE  Urine microscopic-add on Status: Abnormal   Collection Time: 07/01/15 9:00 AM  Result Value Ref Range   Squamous Epithelial / LPF 6-30 (A) NONE SEEN   WBC, UA 6-30 0 - 5 WBC/hpf   RBC / HPF 0-5 0 - 5 RBC/hpf   Bacteria, UA MANY (A) NONE SEEN  CBC Status: Abnormal   Collection Time: 07/01/15 10:15 AM  Result Value Ref Range   WBC 14.9 (H) 4.0 - 10.5 K/uL   RBC 3.73 (L) 3.87 - 5.11 MIL/uL   Hemoglobin 9.4 (L) 12.0 - 15.0 g/dL   HCT 29.8 (L) 36.0 - 46.0 %   MCV 79.9 78.0 - 100.0 fL   MCH 25.2 (L) 26.0 - 34.0 pg   MCHC 31.5 30.0 - 36.0 g/dL   RDW 16.6 (H) 11.5 - 15.5 %   Platelets 184 150 - 400 K/uL    Review of Systems  Constitutional: Positive for fever and chills.  Gastrointestinal: Positive for nausea, vomiting and abdominal pain (Upper right abdominal pain ).  Genitourinary: Positive for flank pain. Negative for hematuria.  Musculoskeletal: Positive for back pain.   Physical Exam   Blood pressure 121/73, pulse 105, temperature 97.6 F (36.4 C), resp. rate 18, last menstrual period 10/26/2014.  Physical Exam  Constitutional: She is oriented to person, place, and time. She appears well-developed and well-nourished. No distress.  HENT:  Head: Normocephalic and atraumatic.   Cardiovascular: Regular rhythm and normal heart sounds. Tachycardia present.  Respiratory: Effort normal and breath sounds normal. No respiratory distress.  GI: Soft. Bowel sounds are normal. She exhibits no distension. There is tenderness (in RUQ, worse with deep palpation). There is CVA tenderness. There is no rebound.  Neurological: She is alert and oriented to person, place, and time.  Skin: Skin is warm and dry.  Psychiatric: She has a normal mood and affect. Her behavior is normal.    MAU Course  Procedures  None  MDM Patient with worsening abdominal and back pain. Recently diagnosed with UTI 1/10, not taking antibiotics prescribed. Obtain UA with culture, CBC, CMP. UA shows presence of WBC, nitrites, ketones 40.  Discussed patient with Dr. Ihor Dow; Will admit for IV antibiotics Urine culture.   Assessment and Plan  Pyleonephritis in the third trimester of pregnancy -Admit to Women's unit -Initiate IVF, NS @ 50 ML/Hr, following 1 liter bolus due to ketonuria.  -Fentanyl 50 mcg injection for pain -  Start cetriaxone 2 gram per 24 hours as recommended by pharmacy.   GERD -Pepcid IV 20 mg BID today -Plan to transition to Protonix 20 mg tomorrow     PA student attestation:   I have seen and examined this patient; I agree with above documentation in the PA student's note.   [name] is a 30 y.o. G3P1011 reporting UTI symptoms; back pain, right upper quadrant pain, dysuria, nausea and vomiting.  +FM, denies LOF, VB, contractions, vaginal discharge.  + fever/chills  PE: BP 121/73 mmHg  Pulse 105  Temp(Src) 97.6 F (36.4 C)  Resp 18  LMP 10/26/2014 (Exact Date) Gen: calm comfortable, NAD Resp: normal effort, no distress Abd: gravid GI: + right CVA tenderness   ROS, labs, PMH reviewed NST reactive   Assessment:  Pyelonephritis; third trimester or pregnancy    Plan:  Admit to Women's unit for IV antibiotics    Lezlie Lye, NP 07/01/2015 10:59 AM       Patient was afebrile with no pain on the day of discharge  Consults: None  Significant Diagnostic Studies: microbiology: urine culture: positive for e. coli  Treatments: IV hydration and antibiotics: ceftriaxone  Discharge Exam: Blood pressure 105/72, pulse 90, temperature 97.8 F (36.6 C), temperature source Oral, resp. rate 15, height 5\' 5"  (1.651 m), weight 94.688 kg (208 lb 12 oz), last menstrual period 10/26/2014, SpO2 100 %. General appearance: alert, cooperative and no distress GI: soft, non-tender; bowel sounds normal; no masses,  no organomegaly and no CVAT Gravid Disposition: 01-Home or Self Care     Medication List    ASK your doctor about these medications        ACCU-CHEK FASTCLIX LANCETS Misc  Use as directed     acetaminophen 500 MG tablet  Commonly known as:  TYLENOL  Take 1,000 mg by mouth every 6 (six) hours as needed for moderate pain.     calcium carbonate 500 MG chewable tablet  Commonly known as:  TUMS - dosed in mg elemental calcium  Chew 1 tablet by mouth daily as needed for indigestion or heartburn.     cephALEXin 500 MG capsule  Commonly known as:  KEFLEX  Take 1 capsule (500 mg total) by mouth 4 (four) times daily.     glucose blood test strip  Use as instructed     ranitidine 75 MG tablet  Commonly known as:  ZANTAC  Take 75 mg by mouth daily as needed for heartburn.           Follow-up Information    Follow up with [facility] In 3 days.   Specialty:  Obstetrics and Gynecology   Contact information:   [place] [phone]      Signed: Emeterio Reeve 07/04/2015, 7:39 AM

## 2015-07-04 NOTE — Discharge Instructions (Signed)

## 2015-07-07 ENCOUNTER — Ambulatory Visit (INDEPENDENT_AMBULATORY_CARE_PROVIDER_SITE_OTHER): Payer: Medicaid Other | Admitting: Certified Nurse Midwife

## 2015-07-07 ENCOUNTER — Other Ambulatory Visit (HOSPITAL_COMMUNITY)
Admission: RE | Admit: 2015-07-07 | Discharge: 2015-07-07 | Disposition: A | Discharge status: Home / Self Care | Payer: Medicaid Other | Source: Ambulatory Visit | Attending: Certified Nurse Midwife | Admitting: Certified Nurse Midwife

## 2015-07-07 VITALS — BP 109/72 | HR 87 | Wt 216.0 lb

## 2015-07-07 DIAGNOSIS — Z113 Encounter for screening for infections with a predominantly sexual mode of transmission: Secondary | ICD-10-CM | POA: Insufficient documentation

## 2015-07-07 DIAGNOSIS — O24410 Gestational diabetes mellitus in pregnancy, diet controlled: Secondary | ICD-10-CM

## 2015-07-07 DIAGNOSIS — O0993 Supervision of high risk pregnancy, unspecified, third trimester: Secondary | ICD-10-CM

## 2015-07-07 DIAGNOSIS — O34219 Maternal care for unspecified type scar from previous cesarean delivery: Secondary | ICD-10-CM

## 2015-07-07 DIAGNOSIS — Z36 Encounter for antenatal screening of mother: Secondary | ICD-10-CM | POA: Diagnosis not present

## 2015-07-07 DIAGNOSIS — Z3493 Encounter for supervision of normal pregnancy, unspecified, third trimester: Secondary | ICD-10-CM

## 2015-07-07 NOTE — Patient Instructions (Signed)

## 2015-07-08 LAB — URINE CYTOLOGY ANCILLARY ONLY
Chlamydia: NEGATIVE
Neisseria Gonorrhea: NEGATIVE

## 2015-07-09 LAB — CULTURE, BETA STREP (GROUP B ONLY)

## 2015-07-14 ENCOUNTER — Ambulatory Visit (INDEPENDENT_AMBULATORY_CARE_PROVIDER_SITE_OTHER): Payer: Medicaid Other | Admitting: Certified Nurse Midwife

## 2015-07-14 VITALS — BP 103/72 | HR 85 | Wt 215.0 lb

## 2015-07-14 DIAGNOSIS — Z3483 Encounter for supervision of other normal pregnancy, third trimester: Secondary | ICD-10-CM

## 2015-07-14 DIAGNOSIS — O34219 Maternal care for unspecified type scar from previous cesarean delivery: Secondary | ICD-10-CM

## 2015-07-14 DIAGNOSIS — O24410 Gestational diabetes mellitus in pregnancy, diet controlled: Secondary | ICD-10-CM

## 2015-07-14 DIAGNOSIS — Z3493 Encounter for supervision of normal pregnancy, unspecified, third trimester: Secondary | ICD-10-CM

## 2015-07-14 NOTE — Progress Notes (Signed)
Subjective:  [name] is a 30 y.o. G3P1011 at [redacted]w[redacted]d being seen today for ongoing prenatal care.  She is currently monitored for the following issues for this high-risk pregnancy and has Supervision of normal pregnancy; Previous cesarean delivery affecting pregnancy, antepartum; Vaginal bleeding during pregnancy, antepartum; Trichomonal vaginitis during pregnancy; Urinary tract infection during pregnancy in third trimester, antepartum; Gestational diabetes mellitus (GDM), antepartum; Pyelonephritis affecting pregnancy in third trimester; and Esophageal reflux on her problem list.  Patient reports no complaints.  Contractions: Irregular. Vag. Bleeding: None.  Movement: Present. Denies leaking of fluid.   The following portions of the patient's history were reviewed and updated as appropriate: allergies, current medications, past family history, past medical history, past social history, past surgical history and problem list. Problem list updated.  Objective:   Filed Vitals:   07/14/15 1312  BP: 103/72  Pulse: 85  Weight: 215 lb (97.523 kg)    Fetal Status: Fetal Heart Rate (bpm): 160   Movement: Present     General:  Alert, oriented and cooperative. Patient is in no acute distress.  Skin: Skin is warm and dry. No rash noted.   Cardiovascular: Normal heart rate noted  Respiratory: Normal respiratory effort, no problems with respiration noted  Abdomen: Soft, gravid, appropriate for gestational age. Pain/Pressure: Present     Pelvic: Vag. Bleeding: None Vag D/C Character: Thin   Cervical exam performed Dilation: 1 Effacement (%): Thick Station: -2  Extremities: Normal range of motion.  Edema: None  Mental Status: Normal mood and affect. Normal behavior. Normal judgment and thought content.   Urinalysis: Urine Protein: Negative Urine Glucose: Negative  Assessment and Plan:  Pregnancy: G3P1011 at [redacted]w[redacted]d  1. Supervision of normal pregnancy, third trimester   2. Previous cesarean  delivery affecting pregnancy, antepartum   3. Diet controlled gestational diabetes mellitus (GDM), antepartum BG- range 87-120  Term labor symptoms and general obstetric precautions including but not limited to vaginal bleeding, contractions, leaking of fluid and fetal movement were reviewed in detail with the patient. Please refer to After Visit Summary for other counseling recommendations.  Return in about 1 week (around 07/21/2015).   Larey Days, CNM

## 2015-07-14 NOTE — Patient Instructions (Signed)

## 2015-07-21 NOTE — Progress Notes (Signed)
Subjective:  [name] is a 30 y.o. G3P1011 at [redacted]w[redacted]d being seen today for ongoing prenatal care.  She is currently monitored for the following issues for this high-risk pregnancy and has Supervision of normal pregnancy; Previous cesarean delivery affecting pregnancy, antepartum; Vaginal bleeding during pregnancy, antepartum; Trichomonal vaginitis during pregnancy; Urinary tract infection during pregnancy in third trimester, antepartum; Gestational diabetes mellitus (GDM), antepartum; Pyelonephritis affecting pregnancy in third trimester; and Esophageal reflux on her problem list.  Patient reports no complaints.  Contractions: Irregular. Vag. Bleeding: None.  Movement: Present. Denies leaking of fluid.   The following portions of the patient's history were reviewed and updated as appropriate: allergies, current medications, past family history, past medical history, past social history, past surgical history and problem list. Problem list updated.  Objective:   Filed Vitals:   07/07/15 1419  BP: 109/72  Pulse: 87  Weight: 216 lb (97.977 kg)    Fetal Status: Fetal Heart Rate (bpm): 134   Movement: Present     General:  Alert, oriented and cooperative. Patient is in no acute distress.  Skin: Skin is warm and dry. No rash noted.   Cardiovascular: Normal heart rate noted  Respiratory: Normal respiratory effort, no problems with respiration noted  Abdomen: Soft, gravid, appropriate for gestational age. Pain/Pressure: Present     Pelvic: Vag. Bleeding: None Vag D/C Character: Thin   Cervical exam performed        Extremities: Normal range of motion.  Edema: None  Mental Status: Normal mood and affect. Normal behavior. Normal judgment and thought content.   Urinalysis: Urine Protein: Trace Urine Glucose: Negative  Assessment and Plan:  Pregnancy: G3P1011 at [redacted]w[redacted]d  1. Supervision of high risk pregnancy in third trimester  - Culture, beta strep (group b only) - Urine cytology  ancillary only  2. Diet controlled gestational diabetes mellitus (GDM), antepartum   3. Previous cesarean delivery affecting pregnancy, antepartum   4. Supervision of normal pregnancy, third trimester   Term labor symptoms and general obstetric precautions including but not limited to vaginal bleeding, contractions, leaking of fluid and fetal movement were reviewed in detail with the patient. Please refer to After Visit Summary for other counseling recommendations.  Return in about 1 week (around 07/14/2015).   Larey Days, CNM

## 2015-07-23 ENCOUNTER — Encounter (HOSPITAL_COMMUNITY): Payer: Self-pay

## 2015-07-23 ENCOUNTER — Other Ambulatory Visit (HOSPITAL_COMMUNITY)
Admission: RE | Admit: 2015-07-23 | Discharge: 2015-07-23 | Disposition: A | Discharge status: Home / Self Care | Payer: Medicaid Other | Source: Ambulatory Visit | Attending: Obstetrics & Gynecology | Admitting: Obstetrics & Gynecology

## 2015-07-23 ENCOUNTER — Encounter (HOSPITAL_COMMUNITY)
Admission: RE | Admit: 2015-07-23 | Discharge: 2015-07-23 | Disposition: A | Discharge status: Home / Self Care | Payer: Medicaid Other | Source: Ambulatory Visit | Attending: Obstetrics and Gynecology | Admitting: Obstetrics and Gynecology

## 2015-07-23 ENCOUNTER — Ambulatory Visit (INDEPENDENT_AMBULATORY_CARE_PROVIDER_SITE_OTHER): Payer: Medicaid Other | Admitting: Obstetrics & Gynecology

## 2015-07-23 VITALS — BP 123/84 | HR 93 | Wt 217.0 lb

## 2015-07-23 DIAGNOSIS — O0993 Supervision of high risk pregnancy, unspecified, third trimester: Secondary | ICD-10-CM | POA: Diagnosis not present

## 2015-07-23 DIAGNOSIS — N760 Acute vaginitis: Secondary | ICD-10-CM | POA: Insufficient documentation

## 2015-07-23 DIAGNOSIS — O34219 Maternal care for unspecified type scar from previous cesarean delivery: Secondary | ICD-10-CM | POA: Diagnosis not present

## 2015-07-23 DIAGNOSIS — O24410 Gestational diabetes mellitus in pregnancy, diet controlled: Secondary | ICD-10-CM | POA: Diagnosis not present

## 2015-07-23 DIAGNOSIS — Z01812 Encounter for preprocedural laboratory examination: Secondary | ICD-10-CM | POA: Insufficient documentation

## 2015-07-23 DIAGNOSIS — O2343 Unspecified infection of urinary tract in pregnancy, third trimester: Secondary | ICD-10-CM | POA: Diagnosis not present

## 2015-07-23 DIAGNOSIS — A5901 Trichomonal vulvovaginitis: Secondary | ICD-10-CM

## 2015-07-23 DIAGNOSIS — O23593 Infection of other part of genital tract in pregnancy, third trimester: Secondary | ICD-10-CM

## 2015-07-23 HISTORY — DX: Gastro-esophageal reflux disease without esophagitis: K21.9

## 2015-07-23 HISTORY — DX: Disease of blood and blood-forming organs, unspecified: D75.9

## 2015-07-23 LAB — CBC
HEMATOCRIT: 30.7 % — AB (ref 36.0–46.0)
HEMOGLOBIN: 9.6 g/dL — AB (ref 12.0–15.0)
MCH: 24.9 pg — ABNORMAL LOW (ref 26.0–34.0)
MCHC: 31.3 g/dL (ref 30.0–36.0)
MCV: 79.5 fL (ref 78.0–100.0)
Platelets: 187 10*3/uL (ref 150–400)
RBC: 3.86 MIL/uL — ABNORMAL LOW (ref 3.87–5.11)
RDW: 16.9 % — AB (ref 11.5–15.5)
WBC: 8.2 10*3/uL (ref 4.0–10.5)

## 2015-07-23 LAB — BASIC METABOLIC PANEL
ANION GAP: 11 (ref 5–15)
BUN: <5 mg/dL — ABNORMAL LOW (ref 6–20)
CALCIUM: 8.5 mg/dL — AB (ref 8.9–10.3)
CHLORIDE: 106 mmol/L (ref 101–111)
CO2: 19 mmol/L — AB (ref 22–32)
CREATININE: 0.47 mg/dL (ref 0.44–1.00)
GFR calc non Af Amer: >60 mL/min (ref >60)
Glucose, Bld: 92 mg/dL (ref 65–99)
Potassium: 3.9 mmol/L (ref 3.5–5.1)
SODIUM: 136 mmol/L (ref 135–145)

## 2015-07-23 NOTE — Progress Notes (Signed)
Subjective:  [name] is a 29 y.o. G3P1011 at [redacted]w[redacted]d being seen today for ongoing prenatal care.  She is currently monitored for the following issues for this high-risk pregnancy and has Supervision of high-risk pregnancy; Previous cesarean delivery affecting pregnancy, antepartum; Vaginal bleeding during pregnancy, antepartum; Trichomonal vaginitis during pregnancy; Urinary tract infection during pregnancy in third trimester, antepartum; Gestational diabetes mellitus (GDM), antepartum; Pyelonephritis affecting pregnancy in third trimester; and Esophageal reflux on her problem list.  Patient reports no complaints.  Contractions: Irregular. Vag. Bleeding: None.  Movement: Present. Denies leaking of fluid.   The following portions of the patient's history were reviewed and updated as appropriate: allergies, current medications, past family history, past medical history, past social history, past surgical history and problem list. Problem list updated.  Objective:   Filed Vitals:   07/23/15 0916  BP: 123/84  Pulse: 93  Weight: 217 lb (98.431 kg)    Fetal Status: Fetal Heart Rate (bpm): 147 Fundal Height: 39 cm Movement: Present     General:  Alert, oriented and cooperative. Patient is in no acute distress.  Skin: Skin is warm and dry. No rash noted.   Cardiovascular: Normal heart rate noted  Respiratory: Normal respiratory effort, no problems with respiration noted  Abdomen: Soft, gravid, appropriate for gestational age. Pain/Pressure: Present     Pelvic: Vag. Bleeding: None Vag D/C Character: Thin  Cervical exam deferred        Extremities: Normal range of motion.  Edema: None  Mental Status: Normal mood and affect. Normal behavior. Normal judgment and thought content.   Urinalysis: Urine Protein: Negative Urine Glucose: Negative CBGs within normal limit as per patient, did not bring log book/meter Assessment and Plan:  Pregnancy: G3P1011 at [redacted]w[redacted]d  1. Diet controlled gestational  diabetes mellitus (GDM), antepartum Continue diet control.  2. Previous cesarean delivery affecting pregnancy, antepartum Scheduled on 07/26/15/39 weeks. Preoperative instructions reviewed with patient.  3. Urinary tract infection during pregnancy in third trimester, antepartum Needs test of cure from recent positive culture - Culture, OB Urine  4. Trichomonal vaginitis during pregnancy, third trimester Needs test of cure from recent positive culture - Urine cytology ancillary only  5. Supervision of high-risk pregnancy, third trimester Term labor symptoms and general obstetric precautions including but not limited to vaginal bleeding, contractions, leaking of fluid and fetal movement were reviewed in detail with the patient. Please refer to After Visit Summary for other counseling recommendations.  Return in about 7 weeks (around 09/10/2015) for Postpartum check, 2 hr GTT (needs to be fasting).   Osborne Oman, MD

## 2015-07-23 NOTE — Patient Instructions (Signed)
Your procedure is scheduled on:  Monday, Feb. 6, 2017  Enter through the [employer] [facility] at:  8:00A.M.  Pick up the phone at the desk and dial [phone].  Call this number if you have problems the morning of surgery: [phone].  Remember: Do NOT eat food or drink after:  Midnight Sunday  Take these medicines the morning of surgery with a SIP OF WATER:  Zantac  Do NOT wear jewelry (body piercing), metal hair clips/bobby pins, or nail polish. Do NOT wear lotions, powders, or perfumes.  You may wear deoderant. Do NOT shave for 48 hours prior to surgery. Do NOT bring valuables to the hospital.  Leave suitcase in car.  After surgery it may be brought to your room.  For patients admitted to the hospital, checkout time is 11:00 AM the day of discharge.

## 2015-07-23 NOTE — Patient Instructions (Signed)
Return to clinic for any obstetric concerns or go to MAU for evaluation Cesarean Delivery Cesarean delivery is the birth of a baby through a cut (incision) in the abdomen and womb (uterus).  LET YOUR HEALTH CARE PROVIDER KNOW ABOUT:  All medicines you are taking, including vitamins, herbs, eye drops, creams, and over-the-counter medicines.  Previous problems you or members of your family have had with the use of anesthetics.  Any bleeding or blood clotting disorders you have.  Family history of blood clots or bleeding disorders.  Any history of deep vein thrombosis (DVT) or pulmonary embolism (PE).  Previous surgeries you have had.  Medical conditions you have.  Any allergies you have.  Complicationsinvolving the pregnancy. RISKS AND COMPLICATIONS  Generally, this is a safe procedure. However, as with any procedure, complications can occur. Possible complications include:  Bleeding.  Infection.  Blood clots.  Injury to surrounding organs.  Problems with anesthesia.  Injury to the baby. BEFORE THE PROCEDURE   You may be given an antacid medicine to drink. This will prevent acid contents in your stomach from going into your lungs if you vomit during the surgery.  You may be given an antibiotic medicine to prevent infection. PROCEDURE   To prevent infection of your incision:  Hair may be removed from your pubic area if it is near your incision.  The skin of your pubic area and lower abdomen will be cleaned with a germ-killing solution (antiseptic).  A tube (Foley catheter) will be placed in your bladder to drain your urine from your bladder into a bag. This keeps your bladder empty during surgery.  An IV tube will be placed in your vein.  You may be given medicine to numb the lower half of your body (regional anesthetic). If you were in labor, you may have already had an epidural in place which can be used in both labor and cesarean delivery. You may possibly be  given medicine to make you sleep (general anesthetic) though this is not as common.  Your heart rate and your baby's heart rate will be monitored.  An incision will be made in your abdomen that extends to your uterus. There are 2 basic kinds of incisions:  The horizontal (transverse) incision. Horizontal incisions are from side to side and are used for most routine cesarean deliveries.  The vertical incision. The vertical incision is from the top of the abdomen to the bottom and is less commonly used. It is often done for women who have a serious complication (extreme prematurity) or under emergency situations.  The horizontal and vertical incisions may both be used at the same time. However, this is very uncommon.  An incision is then made in your uterus to deliver the baby.  Your baby will be delivered.  Your health care provider may place the baby on your chest. It is important to keep the baby warm. Your health care provider will dry off the baby, place the baby directly on your bare skin, and cover the baby with warm, dry blankets.  Both incisions will be closed with absorbable stitches. AFTER THE PROCEDURE   If you were awake during the surgery, you will see your baby right away. If you were asleep, you will see your baby as soon as you are awake.  You may breastfeed your baby after surgery.  You may be able to get up and walk the same day as the surgery. If you need to stay in bed for a period of   time, you will receive help to turn, cough, and take deep breaths after surgery. This helps prevent lung problems such as pneumonia.  Do not get out of bed alone the first time after surgery. You will need help getting out of bed until you are able to do this by yourself.  You may be able to shower the day after your cesarean delivery. After the bandage (dressing) is taken off the incision site, a nurse will assist you to shower if you would like help.  You may be directed to take  actions to help prevent blood clots in your legs. These may include:  Walking shortly after surgery, with someone assisting you. Moving around after surgery helps to improve blood flow.  Wearing compression stockings or using different types of devices.  Taking medicines to thin your blood (anticoagulants) if you are at high risk for DVT or PE.  Save any blood clots that you pass from your vagina. If you pass a clot while on the toilet, do not flush it. Call for the nurse. Tell the nurse if you think you are bleeding too much or passing too many clots.  You will be given medicine for pain and nausea as needed. Let your health care providers know if you are hurting. You may also be given an antibiotic to prevent an infection.  Your IV tube will be taken out when you are drinking a reasonable amount of fluids. The Foley catheter is taken out when you are up and walking.  If your blood type is Rh negative and your baby's blood type is Rh positive, you will be given a shot of anti-D immune globulin. This shot prevents you from having Rh problems with a future pregnancy. You should get the shot even if you had your tubes tied (tubal ligation).  If you are allowed to take the baby for a walk, place the baby in the bassinet and push it.   This information is not intended to replace advice given to you by your health care provider. Make sure you discuss any questions you have with your health care provider.   Document Released: 06/05/2005 Document Revised: 02/24/2015 Document Reviewed: 01/31/2012 Elsevier Interactive Patient Education 2016 [employer]  

## 2015-07-24 LAB — RPR: RPR: NONREACTIVE

## 2015-07-25 LAB — CULTURE, OB URINE: Colony Count: 60000

## 2015-07-26 ENCOUNTER — Inpatient Hospital Stay (HOSPITAL_COMMUNITY): Payer: Medicaid Other | Admitting: Anesthesiology

## 2015-07-26 ENCOUNTER — Encounter (HOSPITAL_COMMUNITY)
Admission: RE | Disposition: A | Discharge status: Home / Self Care | Payer: Self-pay | Source: Ambulatory Visit | Attending: Obstetrics and Gynecology

## 2015-07-26 ENCOUNTER — Encounter (HOSPITAL_COMMUNITY): Payer: Self-pay | Admitting: Emergency Medicine

## 2015-07-26 ENCOUNTER — Inpatient Hospital Stay (HOSPITAL_COMMUNITY)
Admission: RE | Admit: 2015-07-26 | Discharge: 2015-07-28 | DRG: 765 | Disposition: A | Discharge status: Home / Self Care | Payer: Medicaid Other | Source: Ambulatory Visit | Attending: Obstetrics and Gynecology | Admitting: Obstetrics and Gynecology

## 2015-07-26 DIAGNOSIS — A5901 Trichomonal vulvovaginitis: Secondary | ICD-10-CM | POA: Diagnosis present

## 2015-07-26 DIAGNOSIS — O24420 Gestational diabetes mellitus in childbirth, diet controlled: Principal | ICD-10-CM | POA: Diagnosis present

## 2015-07-26 DIAGNOSIS — O9962 Diseases of the digestive system complicating childbirth: Secondary | ICD-10-CM | POA: Diagnosis present

## 2015-07-26 DIAGNOSIS — Z23 Encounter for immunization: Secondary | ICD-10-CM | POA: Diagnosis not present

## 2015-07-26 DIAGNOSIS — Z79899 Other long term (current) drug therapy: Secondary | ICD-10-CM

## 2015-07-26 DIAGNOSIS — O9832 Other infections with a predominantly sexual mode of transmission complicating childbirth: Secondary | ICD-10-CM | POA: Diagnosis present

## 2015-07-26 DIAGNOSIS — O9902 Anemia complicating childbirth: Secondary | ICD-10-CM | POA: Diagnosis present

## 2015-07-26 DIAGNOSIS — D649 Anemia, unspecified: Secondary | ICD-10-CM | POA: Diagnosis present

## 2015-07-26 DIAGNOSIS — Z302 Encounter for sterilization: Secondary | ICD-10-CM

## 2015-07-26 DIAGNOSIS — Z3A39 39 weeks gestation of pregnancy: Secondary | ICD-10-CM | POA: Diagnosis not present

## 2015-07-26 DIAGNOSIS — O34211 Maternal care for low transverse scar from previous cesarean delivery: Secondary | ICD-10-CM | POA: Diagnosis not present

## 2015-07-26 DIAGNOSIS — K219 Gastro-esophageal reflux disease without esophagitis: Secondary | ICD-10-CM | POA: Diagnosis present

## 2015-07-26 DIAGNOSIS — Z98891 History of uterine scar from previous surgery: Secondary | ICD-10-CM

## 2015-07-26 HISTORY — PX: TUBAL LIGATION: SHX77

## 2015-07-26 LAB — URINE CYTOLOGY ANCILLARY ONLY: Trichomonas: NEGATIVE

## 2015-07-26 LAB — PREPARE RBC (CROSSMATCH)

## 2015-07-26 LAB — GLUCOSE, CAPILLARY: GLUCOSE-CAPILLARY: 95 mg/dL (ref 65–99)

## 2015-07-26 SURGERY — Surgical Case
Anesthesia: Spinal | Site: Abdomen | Laterality: Bilateral

## 2015-07-26 MED ORDER — ONDANSETRON HCL 4 MG/2ML IJ SOLN: 4.0000 mg | Freq: Three times a day (TID) | INTRAMUSCULAR | Status: DC | PRN

## 2015-07-26 MED ORDER — LACTATED RINGERS IV SOLN
INTRAVENOUS | Status: DC
  Administered 2015-07-26: 18:00:00 via INTRAVENOUS

## 2015-07-26 MED ORDER — SIMETHICONE 80 MG PO CHEW
80.0000 mg | CHEWABLE_TABLET | ORAL | Status: DC
  Administered 2015-07-27: 80 mg via ORAL
  Filled 2015-07-26 (×4): qty 1

## 2015-07-26 MED ORDER — NALBUPHINE HCL 10 MG/ML IJ SOLN
INTRAMUSCULAR | Status: AC
  Filled 2015-07-26: qty 1

## 2015-07-26 MED ORDER — SCOPOLAMINE 1 MG/3DAYS TD PT72
MEDICATED_PATCH | TRANSDERMAL | Status: AC
  Administered 2015-07-26: 1.5 mg via TRANSDERMAL
  Filled 2015-07-26: qty 1

## 2015-07-26 MED ORDER — MENTHOL 3 MG MT LOZG
1.0000 | LOZENGE | OROMUCOSAL | Status: DC | PRN
  Filled 2015-07-26: qty 9

## 2015-07-26 MED ORDER — PHENYLEPHRINE 8 MG IN D5W 100 ML (0.08MG/ML) PREMIX OPTIME
INJECTION | INTRAVENOUS | Status: DC | PRN
  Administered 2015-07-26: 60 ug/min via INTRAVENOUS

## 2015-07-26 MED ORDER — IBUPROFEN 600 MG PO TABS
600.0000 mg | ORAL_TABLET | Freq: Four times a day (QID) | ORAL | Status: DC
  Administered 2015-07-27 – 2015-07-28 (×6): 600 mg via ORAL
  Filled 2015-07-26 (×6): qty 1

## 2015-07-26 MED ORDER — KETOROLAC TROMETHAMINE 30 MG/ML IJ SOLN
30.0000 mg | Freq: Four times a day (QID) | INTRAMUSCULAR | Status: AC | PRN
  Administered 2015-07-26: 30 mg via INTRAMUSCULAR

## 2015-07-26 MED ORDER — PRENATAL MULTIVITAMIN CH
1.0000 | ORAL_TABLET | Freq: Every day | ORAL | Status: DC
  Administered 2015-07-27: 1 via ORAL
  Filled 2015-07-26 (×3): qty 1

## 2015-07-26 MED ORDER — OXYCODONE-ACETAMINOPHEN 5-325 MG PO TABS
2.0000 | ORAL_TABLET | ORAL | Status: DC | PRN
Start: 2015-07-26 — End: 2015-07-28

## 2015-07-26 MED ORDER — DIBUCAINE 1 % RE OINT: 1.0000 "application " | TOPICAL_OINTMENT | RECTAL | Status: DC | PRN

## 2015-07-26 MED ORDER — LACTATED RINGERS IV SOLN
Freq: Once | INTRAVENOUS | Status: AC
  Administered 2015-07-26: 09:00:00 via INTRAVENOUS

## 2015-07-26 MED ORDER — DIPHENHYDRAMINE HCL 25 MG PO CAPS
25.0000 mg | ORAL_CAPSULE | Freq: Four times a day (QID) | ORAL | Status: DC | PRN
  Filled 2015-07-26: qty 1

## 2015-07-26 MED ORDER — MORPHINE SULFATE (PF) 0.5 MG/ML IJ SOLN
INTRAMUSCULAR | Status: AC
  Filled 2015-07-26: qty 10

## 2015-07-26 MED ORDER — SODIUM CHLORIDE 0.9% FLUSH: 3.0000 mL | INTRAVENOUS | Status: DC | PRN

## 2015-07-26 MED ORDER — FENTANYL CITRATE (PF) 100 MCG/2ML IJ SOLN
INTRAMUSCULAR | Status: AC
  Filled 2015-07-26: qty 2

## 2015-07-26 MED ORDER — NALOXONE HCL 2 MG/2ML IJ SOSY
1.0000 ug/kg/h | PREFILLED_SYRINGE | INTRAVENOUS | Status: DC | PRN
  Filled 2015-07-26: qty 2

## 2015-07-26 MED ORDER — NALOXONE HCL 0.4 MG/ML IJ SOLN: 0.4000 mg | INTRAMUSCULAR | Status: DC | PRN

## 2015-07-26 MED ORDER — MORPHINE SULFATE (PF) 0.5 MG/ML IJ SOLN
INTRAMUSCULAR | Status: DC | PRN
  Administered 2015-07-26: .2 mg via INTRATHECAL

## 2015-07-26 MED ORDER — INFLUENZA VAC SPLIT QUAD 0.5 ML IM SUSY
0.5000 mL | PREFILLED_SYRINGE | INTRAMUSCULAR | Status: AC
  Administered 2015-07-28: 0.5 mL via INTRAMUSCULAR
  Filled 2015-07-26: qty 0.5

## 2015-07-26 MED ORDER — CEFAZOLIN SODIUM-DEXTROSE 2-3 GM-% IV SOLR
INTRAVENOUS | Status: AC
  Filled 2015-07-26: qty 50

## 2015-07-26 MED ORDER — OXYTOCIN 10 UNIT/ML IJ SOLN
40.0000 [IU] | INTRAMUSCULAR | Status: DC | PRN
  Administered 2015-07-26: 40 [IU] via INTRAVENOUS

## 2015-07-26 MED ORDER — TETANUS-DIPHTH-ACELL PERTUSSIS 5-2.5-18.5 LF-MCG/0.5 IM SUSP
0.5000 mL | Freq: Once | INTRAMUSCULAR | Status: DC
  Filled 2015-07-26: qty 0.5

## 2015-07-26 MED ORDER — OXYTOCIN 10 UNIT/ML IJ SOLN: 2.5000 [IU]/h | INTRAVENOUS | Status: AC

## 2015-07-26 MED ORDER — ONDANSETRON HCL 4 MG/2ML IJ SOLN
INTRAMUSCULAR | Status: DC | PRN
  Administered 2015-07-26: 4 mg via INTRAVENOUS

## 2015-07-26 MED ORDER — DIPHENHYDRAMINE HCL 25 MG PO CAPS
25.0000 mg | ORAL_CAPSULE | ORAL | Status: DC | PRN
Start: 2015-07-26 — End: 2015-07-28
  Administered 2015-07-26: 25 mg via ORAL
  Filled 2015-07-26 (×2): qty 1

## 2015-07-26 MED ORDER — BUPIVACAINE IN DEXTROSE 0.75-8.25 % IT SOLN
INTRATHECAL | Status: DC | PRN
  Administered 2015-07-26: 1.6 mL via INTRATHECAL

## 2015-07-26 MED ORDER — SIMETHICONE 80 MG PO CHEW
80.0000 mg | CHEWABLE_TABLET | ORAL | Status: DC | PRN
  Filled 2015-07-26: qty 1

## 2015-07-26 MED ORDER — PHENYLEPHRINE 8 MG IN D5W 100 ML (0.08MG/ML) PREMIX OPTIME
INJECTION | INTRAVENOUS | Status: AC
  Filled 2015-07-26: qty 100

## 2015-07-26 MED ORDER — NALBUPHINE HCL 10 MG/ML IJ SOLN
5.0000 mg | INTRAMUSCULAR | Status: DC | PRN
  Administered 2015-07-26 (×2): 5 mg via INTRAVENOUS
  Filled 2015-07-26 (×2): qty 1

## 2015-07-26 MED ORDER — NALBUPHINE HCL 10 MG/ML IJ SOLN: 5.0000 mg | INTRAMUSCULAR | Status: DC | PRN

## 2015-07-26 MED ORDER — CEFAZOLIN SODIUM-DEXTROSE 2-3 GM-% IV SOLR
2.0000 g | INTRAVENOUS | Status: AC
  Administered 2015-07-26: 2 g via INTRAVENOUS

## 2015-07-26 MED ORDER — SODIUM CHLORIDE 0.9 % IR SOLN
Status: DC | PRN
  Administered 2015-07-26: 1000 mL

## 2015-07-26 MED ORDER — LANOLIN HYDROUS EX OINT: 1.0000 "application " | TOPICAL_OINTMENT | CUTANEOUS | Status: DC | PRN

## 2015-07-26 MED ORDER — MEPERIDINE HCL 25 MG/ML IJ SOLN: 6.2500 mg | INTRAMUSCULAR | Status: DC | PRN

## 2015-07-26 MED ORDER — FENTANYL CITRATE (PF) 100 MCG/2ML IJ SOLN: 25.0000 ug | INTRAMUSCULAR | Status: DC | PRN

## 2015-07-26 MED ORDER — SIMETHICONE 80 MG PO CHEW
80.0000 mg | CHEWABLE_TABLET | Freq: Three times a day (TID) | ORAL | Status: DC
  Administered 2015-07-26 – 2015-07-27 (×4): 80 mg via ORAL
  Filled 2015-07-26 (×7): qty 1

## 2015-07-26 MED ORDER — SENNOSIDES-DOCUSATE SODIUM 8.6-50 MG PO TABS
2.0000 | ORAL_TABLET | ORAL | Status: DC
  Administered 2015-07-27 (×2): 2 via ORAL
  Filled 2015-07-26 (×3): qty 2

## 2015-07-26 MED ORDER — LACTATED RINGERS IV SOLN
INTRAVENOUS | Status: DC | PRN
  Administered 2015-07-26: 10:00:00 via INTRAVENOUS

## 2015-07-26 MED ORDER — OXYTOCIN 10 UNIT/ML IJ SOLN
INTRAMUSCULAR | Status: AC
  Filled 2015-07-26: qty 4

## 2015-07-26 MED ORDER — LACTATED RINGERS IV BOLUS (SEPSIS)
500.0000 mL | Freq: Once | INTRAVENOUS | Status: AC
  Administered 2015-07-26: 500 mL via INTRAVENOUS

## 2015-07-26 MED ORDER — ONDANSETRON HCL 4 MG/2ML IJ SOLN
INTRAMUSCULAR | Status: AC
  Filled 2015-07-26: qty 2

## 2015-07-26 MED ORDER — IBUPROFEN 600 MG PO TABS: 600.0000 mg | ORAL_TABLET | Freq: Four times a day (QID) | ORAL | Status: DC | PRN

## 2015-07-26 MED ORDER — KETOROLAC TROMETHAMINE 30 MG/ML IJ SOLN
30.0000 mg | Freq: Four times a day (QID) | INTRAMUSCULAR | Status: AC | PRN
  Administered 2015-07-26: 30 mg via INTRAVENOUS
  Filled 2015-07-26: qty 1

## 2015-07-26 MED ORDER — DIPHENHYDRAMINE HCL 50 MG/ML IJ SOLN
12.5000 mg | INTRAMUSCULAR | Status: DC | PRN
Start: 2015-07-26 — End: 2015-07-28

## 2015-07-26 MED ORDER — SCOPOLAMINE 1 MG/3DAYS TD PT72
1.0000 | MEDICATED_PATCH | Freq: Once | TRANSDERMAL | Status: DC
  Administered 2015-07-26: 1.5 mg via TRANSDERMAL

## 2015-07-26 MED ORDER — NALBUPHINE HCL 10 MG/ML IJ SOLN
5.0000 mg | Freq: Once | INTRAMUSCULAR | Status: AC | PRN
  Administered 2015-07-26: 5 mg via INTRAVENOUS

## 2015-07-26 MED ORDER — WITCH HAZEL-GLYCERIN EX PADS: 1.0000 "application " | MEDICATED_PAD | CUTANEOUS | Status: DC | PRN

## 2015-07-26 MED ORDER — FENTANYL CITRATE (PF) 100 MCG/2ML IJ SOLN
INTRAMUSCULAR | Status: DC | PRN
  Administered 2015-07-26: 20 ug via INTRATHECAL

## 2015-07-26 MED ORDER — NALBUPHINE HCL 10 MG/ML IJ SOLN: 5.0000 mg | Freq: Once | INTRAMUSCULAR | Status: AC | PRN

## 2015-07-26 MED ORDER — OXYCODONE-ACETAMINOPHEN 5-325 MG PO TABS
1.0000 | ORAL_TABLET | ORAL | Status: DC | PRN
Start: 2015-07-26 — End: 2015-07-28
  Administered 2015-07-27 (×3): 1 via ORAL
  Filled 2015-07-26 (×3): qty 1

## 2015-07-26 MED ORDER — ACETAMINOPHEN 325 MG PO TABS: 650.0000 mg | ORAL_TABLET | ORAL | Status: DC | PRN

## 2015-07-26 MED ORDER — KETOROLAC TROMETHAMINE 30 MG/ML IJ SOLN
INTRAMUSCULAR | Status: AC
  Filled 2015-07-26: qty 1

## 2015-07-26 MED ORDER — LACTATED RINGERS IV SOLN
INTRAVENOUS | Status: DC
  Administered 2015-07-26 (×3): via INTRAVENOUS

## 2015-07-26 SURGICAL SUPPLY — 35 items
APL SKNCLS STERI-STRIP NONHPOA (GAUZE/BANDAGES/DRESSINGS) ×1
BENZOIN TINCTURE PRP APPL 2/3 (GAUZE/BANDAGES/DRESSINGS) ×4 IMPLANT
BRR ADH 6X5 SEPRAFILM 1 SHT (MISCELLANEOUS)
CLAMP CORD UMBIL (MISCELLANEOUS) IMPLANT
CLOSURE STERI STRIP 1/2 X4 (GAUZE/BANDAGES/DRESSINGS) ×4 IMPLANT
CONTAINER PREFILL 10% NBF 15ML (MISCELLANEOUS) IMPLANT
DRAPE SHEET LG 3/4 BI-LAMINATE (DRAPES) IMPLANT
DRSG OPSITE POSTOP 4X10 (GAUZE/BANDAGES/DRESSINGS) ×4 IMPLANT
DURAPREP 26ML APPLICATOR (WOUND CARE) ×4 IMPLANT
ELECT REM PT RETURN 9FT ADLT (ELECTROSURGICAL) ×4
ELECTRODE REM PT RTRN 9FT ADLT (ELECTROSURGICAL) ×2 IMPLANT
EXTRACTOR VACUUM M CUP 4 TUBE (SUCTIONS) IMPLANT
EXTRACTOR VACUUM M CUP 4' TUBE (SUCTIONS)
GLOVE BIOGEL PI IND STRL 6.5 (GLOVE) ×2 IMPLANT
GLOVE BIOGEL PI IND STRL 7.0 (GLOVE) ×2 IMPLANT
GLOVE BIOGEL PI INDICATOR 6.5 (GLOVE) ×2
GLOVE BIOGEL PI INDICATOR 7.0 (GLOVE) ×2
GLOVE SURG SS PI 6.0 STRL IVOR (GLOVE) ×4 IMPLANT
GOWN STRL REUS W/TWL LRG LVL3 (GOWN DISPOSABLE) ×8 IMPLANT
KIT ABG SYR 3ML LUER SLIP (SYRINGE) IMPLANT
NEEDLE HYPO 25X5/8 SAFETYGLIDE (NEEDLE) IMPLANT
NS IRRIG 1000ML POUR BTL (IV SOLUTION) ×4 IMPLANT
PACK C SECTION WH (CUSTOM PROCEDURE TRAY) ×4 IMPLANT
PAD ABD 7.5X8 STRL (GAUZE/BANDAGES/DRESSINGS) ×4 IMPLANT
PAD OB MATERNITY 4.3X12.25 (PERSONAL CARE ITEMS) ×4 IMPLANT
PENCIL SMOKE EVAC W/HOLSTER (ELECTROSURGICAL) ×4 IMPLANT
RTRCTR C-SECT PINK 25CM LRG (MISCELLANEOUS) IMPLANT
SEPRAFILM MEMBRANE 5X6 (MISCELLANEOUS) IMPLANT
SPONGE GAUZE 4X4 12PLY STER LF (GAUZE/BANDAGES/DRESSINGS) ×8 IMPLANT
SUT PLAIN 0 NONE (SUTURE) ×4 IMPLANT
SUT PLAIN 2 0 XLH (SUTURE) ×4 IMPLANT
SUT VIC AB 0 CT1 36 (SUTURE) ×16 IMPLANT
SUT VIC AB 4-0 KS 27 (SUTURE) ×4 IMPLANT
TOWEL OR 17X24 6PK STRL BLUE (TOWEL DISPOSABLE) ×4 IMPLANT
TRAY FOLEY CATH SILVER 14FR (SET/KITS/TRAYS/PACK) ×4 IMPLANT

## 2015-07-26 NOTE — H&P (Signed)
LABOR AND DELIVERY ADMISSION HISTORY AND PHYSICAL NOTE  [name] is a 30 y.o. female G25P1011 with IUP at [redacted]w[redacted]d by L/6 presenting for scheduled elective repeat c/s.   She reports positive fetal movement. She denies leakage of fluid or vaginal bleeding.  Prenatal History/Complications:  Past Medical History: Past Medical History  Diagnosis Date  . Kidney stones   . Kidney stones   . Kidney stones   . Uterine fibroid   . Migraine   . Gestational diabetes mellitus (GDM), antepartum   . GERD (gastroesophageal reflux disease)     with pregnancy  . Gestational diabetes     with current pregnancy  . Blood dyscrasia     takes 11 1/2 minutes to clot    Past Surgical History: Past Surgical History  Procedure Laterality Date  . Renal artery stent      placed and removed in 2005  . Cesarean section      Nov 2011    Obstetrical History: OB History    Gravida Para Term Preterm AB TAB SAB Ectopic Multiple Living   3 1 1  1  1   1       Social History: Social History   Social History  . Marital Status: Married    Spouse Name: N/A  . Number of Children: N/A  . Years of Education: N/A   Social History Main Topics  . Smoking status: Never Smoker   . Smokeless tobacco: Never Used  . Alcohol Use: 0.0 oz/week    0 Standard drinks or equivalent per week     Comment: occ  . Drug Use: No  . Sexual Activity: Yes    Birth Control/ Protection: None   Other Topics Concern  . None   Social History Narrative    Family History: Family History  Problem Relation Age of Onset  . Hypertension Mother   . Depression Mother   . Diabetes Father   . Hypertension Father   . Heart disease Father   . Cancer Father     foot    Allergies: Allergies  Allergen Reactions  . Erythromycin Anaphylaxis, Hives and Rash  . Ciprofloxacin Nausea And Vomiting  . Latex Other (See Comments)    Causes irritation.  Cristopher [place] Flavor Other (See Comments)    REACTION: MIGRAINES      Prescriptions prior to admission  Medication Sig Dispense Refill Last Dose  . ACCU-CHEK FASTCLIX LANCETS MISC Use as directed 1 each 8  at Unknown time  . acetaminophen (TYLENOL) 500 MG tablet Take 1,000 mg by mouth every 6 (six) hours as needed for moderate pain.   Past Week at Unknown time  . ranitidine (ZANTAC) 75 MG tablet Take 2 tablets (150 mg total) by mouth 2 (two) times daily. 60 tablet 2 07/26/2015 at Unknown time  . calcium carbonate (TUMS - DOSED IN MG ELEMENTAL CALCIUM) 500 MG chewable tablet Chew 1 tablet by mouth 2 (two) times daily as needed for indigestion or heartburn.    Taking  . cephALEXin (KEFLEX) 500 MG capsule Take 1 capsule (500 mg total) by mouth 4 (four) times daily. 40 capsule 0 Taking  . glucose blood test strip Use as instructed 100 each 12 Taking     Review of Systems   All systems reviewed and negative except as stated in HPI  Blood pressure 116/78, pulse 92, temperature 97.5 F (36.4 C), temperature source Oral, resp. rate 18, last menstrual period 10/26/2014, SpO2 100 %. General appearance: alert, cooperative  and appears stated age Lungs: clear to auscultation bilaterally Heart: regular rate and rhythm Abdomen: soft, non-tender; bowel sounds normal Extremities: No calf swelling or tenderness     Prenatal labs: ABO, Rh: --/--/A POS (02/03 1115) Antibody: NEG (02/03 1115) Rubella: !Error!imm RPR: Non Reactive (02/03 1115)  HBsAg: NEGATIVE (06/22 1131)  HIV: NONREACTIVE (11/30 1459)  GBS:   neg 1 hr Glucola: 137 > 92/200/173/92 Genetic screening:  First wnl Anatomy US: wnl  Prenatal Transfer Tool  Maternal Diabetes: Yes:  Diabetes Type:  Diet controlled Genetic Screening: Normal Maternal Ultrasounds/Referrals: Normal Fetal Ultrasounds or other Referrals:  None Maternal Substance Abuse:  No Significant Maternal Medications:  Keflex Significant Maternal Lab Results: Lab values include: Group B Strep negative  Results for orders placed or  performed during the hospital encounter of 07/26/15 (from the past 24 hour(s))  Glucose, capillary   Collection Time: 07/26/15  8:31 AM  Result Value Ref Range   Glucose-Capillary 95 65 - 99 mg/dL  Prepare RBC (crossmatch)   Collection Time: 07/26/15  9:00 AM  Result Value Ref Range   Order Confirmation ORDER PROCESSED BY BLOOD BANK     Patient Active Problem List   Diagnosis Date Noted  . Pyelonephritis affecting pregnancy in third trimester 07/01/2015  . Esophageal reflux   . Gestational diabetes mellitus (GDM), antepartum 06/10/2015  . Trichomonal vaginitis during pregnancy 05/22/2015  . Urinary tract infection during pregnancy in third trimester, antepartum 05/22/2015  . Vaginal bleeding during pregnancy, antepartum 01/26/2015  . Supervision of high-risk pregnancy 12/09/2014  . Previous cesarean delivery affecting pregnancy, antepartum 12/09/2014    Assessment: [name] is a 30 y.o. G3P1011 at [redacted]w[redacted]d here for elective repeat c/s  #Surgery: The risks of cesarean section were discussed with the patient including but were not limited to: bleeding which may require transfusion or reoperation; infection which may require antibiotics; injury to bowel, bladder, ureters or other surrounding organs; injury to the fetus; need for additional procedures including hysterectomy in the event of a life-threatening hemorrhage; placental abnormalities wth subsequent pregnancies, incisional problems, thromboembolic phenomenon and other postoperative/anesthesia complications.  Patient also desires permanent sterilization.  Other reversible forms of contraception were discussed with patient; she declines all other modalities. Risks of procedure discussed with patient including but not limited to: risk of regret, permanence of method, bleeding, infection, injury to surrounding organs and need for additional procedures.  Failure risk of 1-2% with increased risk of ectopic gestation if pregnancy occurs  was also discussed with patient.  The patient concurred with the proposed plan, giving informed written consent for the procedures.  #Pain: spinal #ID:  gbs negative; hx pyelo this pregnancy, stopped taking Keflex 1 week ago, denies dysuria or fever or flank pain #MOF: breast #MOC: btl, papers signed 06/21/2015 #Circ:  Yes, outpatient #GDM: diet controlled, fasting glucose this AM 90s #Trichomonas vaginalis: due for TOC  Deepti Gunawan B Dillie Burandt 07/26/2015, 8:43 AM

## 2015-07-26 NOTE — Lactation Note (Signed)
This note was copied from the chart of [facility]. Lactation Consultation Note  Patient Name: [name] Today's Date: 07/26/2015 Reason for consult: Initial assessment Baby at 8 hr of life and mom is worried about supply. She thinks that baby is not getting enough. Discussed baby behavior, feeding frequency, baby belly size, voids, wt loss, breast changes, and nipple care. Demonstrated manual expression, colostrum noted bilaterally, spoon in room. Given lactation handouts. Aware of OP services and support group.     Maternal Data Has patient been taught Hand Expression?: Yes Does the patient have breastfeeding experience prior to this delivery?: Yes  Feeding Feeding Type: Breast Fed Length of feed: 5 min  LATCH Score/Interventions                      Lactation Tools Discussed/Used WIC Program: No   Consult Status Consult Status: Follow-up Date: 07/27/15 Follow-up type: In-patient    Denzil Hughes 07/26/2015, 6:06 PM

## 2015-07-26 NOTE — Anesthesia Postprocedure Evaluation (Signed)
Anesthesia Post Note  Patient: [name]  Procedure(s) Performed: Procedure(s) (LRB): C section  (Bilateral) BILATERAL TUBAL LIGATION (Bilateral)  Patient location during evaluation: PACU Anesthesia Type: Spinal Level of consciousness: awake Pain management: pain level controlled Vital Signs Assessment: post-procedure vital signs reviewed and stable Respiratory status: spontaneous breathing Cardiovascular status: stable Postop Assessment: no headache, no backache, spinal receding, patient able to bend at knees and no signs of nausea or vomiting Anesthetic complications: no    Last Vitals:  Filed Vitals:   07/26/15 1726 07/26/15 1930  BP: 115/70 106/59  Pulse:  77  Temp:  36.8 C  Resp:  16    Last Pain:  Filed Vitals:   07/26/15 2004  PainSc: [place]

## 2015-07-26 NOTE — Transfer of Care (Signed)
Immediate Anesthesia Transfer of Care Note  Patient: [name]  Procedure(s) Performed: Procedure(s): C section  (Bilateral) BILATERAL TUBAL LIGATION (Bilateral)  Patient Location: PACU  Anesthesia Type:Spinal  Level of Consciousness: awake, alert , oriented and patient cooperative  Airway & Oxygen Therapy: Patient Spontanous Breathing  Post-op Assessment: Report given to RN and Post -op Vital signs reviewed and stable  Post vital signs: Reviewed and stable  Last Vitals:  Filed Vitals:   07/26/15 0822  BP: 116/78  Pulse: 92  Temp: 36.4 C  Resp: 18    Complications: No apparent anesthesia complications

## 2015-07-26 NOTE — Op Note (Signed)
[name] PROCEDURE DATE: 07/26/2015  PREOPERATIVE DIAGNOSIS: Intrauterine pregnancy at  [redacted]w[redacted]d weeks gestation; patient declines vag del attempt amd desire fpr permanent sterilization  POSTOPERATIVE DIAGNOSIS: The same  PROCEDURE:     Cesarean Section and Bilateral Tubal Sterilization using Pomeroy method  SURGEON:  Dr. Mora Bellman  ASSISTANT: Dr. Laurey Arrow  INDICATIONS: [name] is a 30 y.o. G3P1011 at 101w0d scheduled for cesarean section secondary to patient declines vag del attempt and desire for permanent sterilization.  The risks of cesarean section discussed with the patient included but were not limited to: bleeding which may require transfusion or reoperation; infection which may require antibiotics; injury to bowel, bladder, ureters or other surrounding organs; injury to the fetus; need for additional procedures including hysterectomy in the event of a life-threatening hemorrhage; placental abnormalities wth subsequent pregnancies, incisional problems, thromboembolic phenomenon and other postoperative/anesthesia complications. The patient concurred with the proposed plan, giving informed written consent for the procedure.  Patient also desires permanent sterilization. Risks and benefits of procedure discussed with patient including permanence of method, bleeding, infection, injury to surrounding organs and need for additional procedures. Risk failure of 0.5-1% with increased risk of ectopic gestation if pregnancy occurs was also discussed with patient.   FINDINGS:  Viable female infant in cephalic presentation.  Apgars 9 and 9.  Clear amniotic fluid.  Intact placenta, three vessel cord.  Normal uterus, fallopian tubes and ovaries bilaterally.  ANESTHESIA:    Spinal INTRAVENOUS FLUIDS: 3000 ml ESTIMATED BLOOD LOSS: 500 ml URINE OUTPUT:  500 ml SPECIMENS: Placenta sent to L&D COMPLICATIONS: None immediate  PROCEDURE IN DETAIL:  The patient received intravenous  antibiotics and had sequential compression devices applied to her lower extremities while in the preoperative area.  She was then taken to the operating room where anesthesia was induced and was found to be adequate. A foley catheter was placed into her bladder and attached to [name] gravity. She was then placed in a dorsal supine position with a leftward tilt, and prepped and draped in a sterile manner. After an adequate timeout was performed, a Pfannenstiel skin incision was made with scalpel and carried through to the underlying layer of fascia. The fascia was incised in the midline and this incision was extended bilaterally using the Mayo scissors. Kocher clamps were applied to the superior aspect of the fascial incision and the underlying rectus muscles were dissected off bluntly. A similar process was carried out on the inferior aspect of the facial incision. The rectus muscles were separated in the midline bluntly and the peritoneum was entered bluntly. The Alexis self-retaining retractor was introduced into the abdominal cavity. Attention was turned to the lower uterine segment where a bladder flap was created, and a transverse hysterotomy was made with a scalpel and extended bilaterally bluntly. The infant was successfully delivered, and cord was clamped and cut and infant was handed over to awaiting neonatology team. Uterine massage was then administered and the placenta delivered intact with three-vessel cord. The uterus was cleared of clot and debris.  The hysterotomy was closed with 0 Vicryl in a running locked fashion, and an imbricating layer was also placed with a 0 Vicryl. Overall, excellent hemostasis was noted. The pelvis copiously irrigated and cleared of all clot and debris. Hemostasis was confirmed on all surfaces. The patient's left fallopian tube was then identified, brought to the incision, and grasped with a Babcock clamp. The tube was then followed out to the fimbria. The Babcock clamp  was then  used to grasp the tube approximately 4 cm from the cornual region. A 3 cm segment of the tube was then ligated with free tie of plain gut suture, transected and excised. Good hemostasis was noted and the tube was returned to the abdomen. The right fallopian tube was then identified to its fimbriated end, ligated, and a 3 cm segment excised in a similar fashion. Excellent hemostasis was noted, and the tube returned to the abdomen.   The muscles were reapproximated using 0 vicryl interrupted stitches. The fascia was then closed using 0 Vicryl in a running fashion.  The subcutaneous layer was reapproximated with plain gut and the skin was closed in a subcuticular fashion using 3.0 Vicryl. The patient tolerated the procedure well. Sponge, lap, instrument and needle counts were correct x 2. She was taken to the recovery room in stable condition.    [name],PEGGYMD  07/26/2015 10:26 AM

## 2015-07-26 NOTE — Anesthesia Preprocedure Evaluation (Signed)
Anesthesia Evaluation  Patient identified by MRN, date of birth, ID band Patient awake    Reviewed: Allergy & Precautions, NPO status , Patient's Chart, lab work & pertinent test results  Airway Mallampati: I  TM Distance: >3 FB Neck ROM: Full    Dental no notable dental hx. (+) Chipped,    Pulmonary neg pulmonary ROS,    Pulmonary exam normal breath sounds clear to auscultation       Cardiovascular negative cardio ROS Normal cardiovascular exam Rhythm:Regular Rate:Normal     Neuro/Psych  Headaches, negative psych ROS   GI/Hepatic Neg liver ROS, GERD  Medicated and Controlled,  Endo/Other  diabetes, Well Controlled, GestationalMorbid obesity  Renal/GU Renal diseaseHx/o renal calculi Hx/o pyelonephritis 3rd trimester  negative genitourinary   Musculoskeletal negative musculoskeletal ROS (+)   Abdominal (+) + obese,   Peds  Hematology  (+) Blood dyscrasia, anemia ,   Anesthesia Other Findings   Reproductive/Obstetrics (+) Pregnancy Previous C/Section Desires Sterilization                             Anesthesia Physical Anesthesia Plan  ASA: III  Anesthesia Plan: Spinal   Post-op Pain Management:    Induction:   Airway Management Planned: Natural Airway  Additional Equipment:   Intra-op Plan:   Post-operative Plan:   Informed Consent: I have reviewed the patients History and Physical, chart, labs and discussed the procedure including the risks, benefits and alternatives for the proposed anesthesia with the patient or authorized representative who has indicated his/her understanding and acceptance.     Plan Discussed with: CRNA, Anesthesiologist and Surgeon  Anesthesia Plan Comments:         Anesthesia Quick Evaluation

## 2015-07-26 NOTE — Anesthesia Procedure Notes (Signed)
Spinal Patient location during procedure: OR Start time: 07/26/2015 9:26 AM End time: 07/26/2015 9:28 AM Staffing Anesthesiologist: Lyn Hollingshead Performed by: anesthesiologist and other anesthesia staff  Preanesthetic Checklist Completed: patient identified, surgical consent, pre-op evaluation, timeout performed, IV checked, risks and benefits discussed and monitors and equipment checked Spinal Block Patient position: sitting Prep: site prepped and draped and DuraPrep Patient monitoring: heart rate, cardiac monitor, continuous pulse ox and blood pressure Approach: midline Location: L3-4 Injection technique: single-shot Needle Needle type: Sprotte  Needle gauge: 24 G Needle length: 9 cm Needle insertion depth: 6 cm Assessment Sensory level: T4

## 2015-07-27 ENCOUNTER — Encounter (HOSPITAL_COMMUNITY): Payer: Self-pay | Admitting: Obstetrics and Gynecology

## 2015-07-27 LAB — CBC
HEMATOCRIT: 25.7 % — AB (ref 36.0–46.0)
Hemoglobin: 8 g/dL — ABNORMAL LOW (ref 12.0–15.0)
MCH: 24.6 pg — ABNORMAL LOW (ref 26.0–34.0)
MCHC: 31.1 g/dL (ref 30.0–36.0)
MCV: 79.1 fL (ref 78.0–100.0)
PLATELETS: 152 10*3/uL (ref 150–400)
RBC: 3.25 MIL/uL — ABNORMAL LOW (ref 3.87–5.11)
RDW: 17.1 % — AB (ref 11.5–15.5)
WBC: 8.2 10*3/uL (ref 4.0–10.5)

## 2015-07-27 LAB — TYPE AND SCREEN
ABO/RH(D): A POS
ANTIBODY SCREEN: NEGATIVE
UNIT DIVISION: 0
Unit division: 0

## 2015-07-27 LAB — BIRTH TISSUE RECOVERY COLLECTION (PLACENTA DONATION)

## 2015-07-27 NOTE — Lactation Note (Signed)
This note was copied from the chart of [facility]. Lactation Consultation Note  Patient Name: [name] [id number] Date: 07/27/2015 Reason for consult: Follow-up assessment   Maternal Data    Feeding Feeding Type: Breast Fed  LATCH Score/Interventions Latch: Grasps breast easily, tongue down, lips flanged, rhythmical sucking.  Audible Swallowing: A few with stimulation  Type of Nipple: Everted at rest and after stimulation  Comfort (Breast/Nipple): Soft / non-tender     Hold (Positioning): Assistance needed to correctly position infant at breast and maintain latch.  LATCH Score: 8  Lactation Tools Discussed/Used     Consult Status Consult Status: PRN    Ave Filter 07/27/2015, 4:36 PM

## 2015-07-27 NOTE — Progress Notes (Signed)
Encouraged mom to shower and ambulate

## 2015-07-27 NOTE — Addendum Note (Signed)
Addendum  created 07/27/15 0758 by Jonna Munro, CRNA   Modules edited: Clinical Notes   Clinical Notes:  File: [id number]

## 2015-07-27 NOTE — Progress Notes (Signed)
Subjective: Postpartum Day 1: Cesarean Delivery Patient reports incisional pain and tolerating PO.    Objective: Vital signs in last 24 hours: Temp:  [97.5 F (36.4 C)-98.3 F (36.8 C)] 97.7 F (36.5 C) (02/07 0530) Pulse Rate:  [72-98] 73 (02/07 0530) Resp:  [16-24] 16 (02/07 0530) BP: (91-126)/(50-79) 103/54 mmHg (02/07 0530) SpO2:  [96 %-100 %] 97 % (02/07 0530) Weight:  [215 lb (97.523 kg)] 215 lb (97.523 kg) (02/06 1051)  Physical Exam:  General: alert, cooperative, appears stated age and no distress Lochia: appropriate Uterine Fundus: firm Incision: no significant drainage, no dehiscence, no significant erythema DVT Evaluation: No evidence of DVT seen on physical exam. No cords or calf tenderness. No significant calf/ankle edema.   Recent Labs  07/27/15 0512  HGB 8.0*  HCT 25.7*    Assessment/Plan: Status post Cesarean section. Doing well postoperatively.  Continue current care.  Koren Shiver DARLENE 07/27/2015, 6:57 AM

## 2015-07-27 NOTE — Anesthesia Postprocedure Evaluation (Signed)
Anesthesia Post Note  Patient: [name]  Procedure(s) Performed: Procedure(s) (LRB): C section  (Bilateral) BILATERAL TUBAL LIGATION (Bilateral)  Patient location during evaluation: Mother Baby Anesthesia Type: Spinal Level of consciousness: awake and alert and oriented Pain management: satisfactory to patient Vital Signs Assessment: post-procedure vital signs reviewed and stable Respiratory status: spontaneous breathing and nonlabored ventilation Cardiovascular status: stable Postop Assessment: no headache, no backache, patient able to bend at knees, no signs of nausea or vomiting and adequate PO intake Anesthetic complications: no    Last Vitals:  Filed Vitals:   07/26/15 2345 07/27/15 0530  BP: 101/50 103/54  Pulse: 76 73  Temp: 36.8 C 36.5 C  Resp: 16 16    Last Pain:  Filed Vitals:   07/27/15 0637  PainSc: [name]

## 2015-07-27 NOTE — Progress Notes (Signed)
Assisted patient to move to room 141 due to call bell in bathroom malfunctioning.

## 2015-07-28 LAB — GLUCOSE, CAPILLARY: Glucose-Capillary: 93 mg/dL (ref 65–99)

## 2015-07-28 MED ORDER — OXYCODONE-ACETAMINOPHEN 5-325 MG PO TABS: 1.0000 | ORAL_TABLET | ORAL | Status: DC | PRN

## 2015-07-28 MED ORDER — SENNOSIDES-DOCUSATE SODIUM 8.6-50 MG PO TABS: 2.0000 | ORAL_TABLET | ORAL | Status: DC

## 2015-07-28 MED ORDER — PRENATAL VITAMINS 28-0.8 MG PO TABS: 1.0000 | ORAL_TABLET | Freq: Every day | ORAL | Status: DC

## 2015-07-28 MED ORDER — IBUPROFEN 600 MG PO TABS: 600.0000 mg | ORAL_TABLET | Freq: Four times a day (QID) | ORAL | Status: DC | PRN

## 2015-07-28 NOTE — Progress Notes (Signed)
Discharge instructions reviewed with patient.  Patient states understanding of home care for herself and baby , medications, activity, signs/symptoms to report to MD and return MD office visit.  Patients significant other and family will assist with her care @ home.  No home  equipment needed, patient has prescriptions and all personal belongings.  Patient ambulated for discharge in stable condition with staff without incident.  

## 2015-07-28 NOTE — Discharge Instructions (Signed)
Postpartum Care After Cesarean Delivery After you deliver your newborn (postpartum period), the usual stay in the hospital is 24-72 hours. If there were problems with your labor or delivery, or if you have other medical problems, you might be in the hospital longer.  While you are in the hospital, you will receive help and instructions on how to care for yourself and your newborn during the postpartum period.  While you are in the hospital:  It is normal for you to have pain or discomfort from the incision in your abdomen. Be sure to tell your nurses when you are having pain, where the pain is located, and what makes the pain worse.  If you are breastfeeding, you may feel uncomfortable contractions of your uterus for a couple of weeks. This is normal. The contractions help your uterus get back to normal size.  It is normal to have some bleeding after delivery.  For the first 1-3 days after delivery, the flow is red and the amount may be similar to a period.  It is common for the flow to start and stop.  In the first few days, you may pass some small clots. Let your nurses know if you begin to pass large clots or your flow increases.  Do not  flush blood clots down the toilet before having the nurse look at them.  During the next 3-10 days after delivery, your flow should become more watery and pink or brown-tinged in color.  Ten to fourteen days after delivery, your flow should be a small amount of yellowish-white discharge.  The amount of your flow will decrease over the first few weeks after delivery. Your flow may stop in 6-8 weeks. Most women have had their flow stop by 12 weeks after delivery.  You should change your sanitary pads frequently.  Wash your hands thoroughly with soap and water for at least 20 seconds after changing pads, using the toilet, or before holding or feeding your newborn.  Your intravenous (IV) tubing will be removed when you are drinking enough fluids.  The  urine drainage tube (urinary catheter) that was inserted before delivery may be removed within 6-8 hours after delivery or when feeling returns to your legs. You should feel like you need to empty your bladder within the first 6-8 hours after the catheter has been removed.  In case you become weak, lightheaded, or faint, call your nurse before you get out of bed for the first time and before you take a shower for the first time.  Within the first few days after delivery, your breasts may begin to feel tender and full. This is called engorgement. Breast tenderness usually goes away within 48-72 hours after engorgement occurs. You may also notice milk leaking from your breasts. If you are not breastfeeding, do not stimulate your breasts. Breast stimulation can make your breasts produce more milk.  Spending as much time as possible with your newborn is very important. During this time, you and your newborn can feel close and get to know each other. Having your newborn stay in your room (rooming in) will help to strengthen the bond with your newborn. It will give you time to get to know your newborn and become comfortable caring for your newborn.  Your hormones change after delivery. Sometimes the hormone changes can temporarily cause you to feel sad or tearful. These feelings should not last more than a few days. If these feelings last longer than that, you should talk to your  caregiver.  If desired, talk to your caregiver about methods of family planning or contraception.  Talk to your caregiver about immunizations. Your caregiver may want you to have the following immunizations before leaving the hospital:  Tetanus, diphtheria, and pertussis (Tdap) or tetanus and diphtheria (Td) immunization. It is very important that you and your family (including grandparents) or others caring for your newborn are up-to-date with the Tdap or Td immunizations. The Tdap or Td immunization can help protect your newborn  from getting ill.  Rubella immunization.  Varicella (chickenpox) immunization.  Influenza immunization. You should receive this annual immunization if you did not receive the immunization during your pregnancy.   This information is not intended to replace advice given to you by your health care provider. Make sure you discuss any questions you have with your health care provider.   Document Released: 02/28/2012 Document Reviewed: 02/28/2012 Elsevier Interactive Patient Education 2016 [employer].  Postpartum Tubal Ligation, Care After Refer to this sheet in the next few weeks. These instructions provide you with information about caring for yourself after your procedure. Your health care provider may also give you more specific instructions. Your treatment has been planned according to current medical practices, but problems sometimes occur. Call your health care provider if you have any problems or questions after your procedure. WHAT TO EXPECT AFTER THE PROCEDURE After your procedure, it is common to have:  Sore throat.  Soreness at the incision site.  Mild cramping.  Tiredness.  Mild nausea or vomiting. HOME CARE INSTRUCTIONS  Rest for the remainder of the day.  Take medicines only as directed by your health care provider. These include over-the-counter medicines and prescription medicines. Do not take aspirin, which can cause bleeding.  Over the next few days, gradually return to your normal activities and your normal diet.  Avoid sexual intercourse for 2 weeks or as directed by your health care provider.  Do not drive or operate heavy machinery while taking pain medicine.  Do not lift anything that is heavier than 5 lb (2.3 kg) for 2 weeks or as directed by your health care provider.  Do not take baths. Take showers only. Ask your health care provider when you can start taking baths.  Take your temperature twice each day and write it down.  Try to have help for  the first 7-10 days for your household needs.  There are many different ways to close and cover an incision, including stitches (sutures), skin glue, and adhesive strips. Follow instructions from your health care provider about:  Incision care.  Bandage (dressing) changes and removal.  Incision closure removal.  Check your incision area every day for signs of infection. Watch for:  Redness, swelling, or pain.  Fluid, blood, or pus.  Keep all follow-up visits as directed by your health care provider. SEEK MEDICAL CARE IF:  You have redness, swelling, or increasing pain in your incision area.  You have fluid or pus coming from your incision for longer than 1 day.  You notice a bad smell coming from your incision or your dressing.  The edges of your incision break open after the sutures have been removed.  Your pain does not decrease after 2-3 days.  You have a rash.  You repeatedly become dizzy or light-headed.  You have a reaction to your medicine.  Your pain medicine is not helping.  You are constipated. SEEK IMMEDIATE MEDICAL CARE IF:   You have a fever.  You faint.  You have  increasing pain in your abdomen.  You have bleeding or drainage from your suture sites or your vagina after surgery.  You have shortness of breath or have difficulty breathing.  You have chest pain or leg pain.  You have ongoing nausea, vomiting, or diarrhea.   This information is not intended to replace advice given to you by your health care provider. Make sure you discuss any questions you have with your health care provider.   Document Released: 12/05/2011 Document Revised: 10/20/2014 Document Reviewed: 12/05/2011 Elsevier Interactive Patient Education [employer].

## 2015-07-28 NOTE — Discharge Summary (Signed)
OB Discharge Summary     Patient Name: [name] DOB: 04/16/1986 MRN: [id number]  Date of admission: 07/26/2015 Delivering MD: Mora Bellman   Date of discharge: 07/28/2015  Admitting diagnosis: PREVIOUS C S DESIRE STERILZATION  Intrauterine pregnancy: [redacted]w[redacted]d     Secondary diagnosis:  Active Problems:   S/P cesarean section  Additional problems:  H/o C/S delivery Trichomonal infection during pregnancy - treated UTI/Pylenoephritis during third trimester  A1GDM Anemia  GERD     Discharge diagnosis: Term Pregnancy Delivered and GDM A1                                                                                                Post partum procedures:postpartum tubal ligation  Augmentation: n/a  Complications: None  Hospital course:  Sceduled C/S   30 y.o. yo [id number] at [redacted]w[redacted]d was admitted to the hospital 07/26/2015 for scheduled cesarean section with the following indication:Elective Repeat. Postpartum BTL was also performed intraoperatively.  Membrane Rupture Time/Date: 9:52 AM ,07/26/2015   Patient delivered a Viable infant.07/26/2015  Details of operation can be found in separate operative note.  Pateint had an uncomplicated postpartum course.  She is ambulating, tolerating a regular diet, passing flatus, and urinating well. Patient is discharged home in stable condition on  07/28/2015          Physical exam  Filed Vitals:   07/27/15 0530 07/27/15 0945 07/27/15 1905 07/28/15 0528  BP: 103/54 114/68 110/76 111/73  Pulse: 73 78 80 73  Temp: 97.7 F (36.5 C) 98 F (36.7 C) 97.7 F (36.5 C) 97.6 F (36.4 C)  TempSrc: Oral Oral Oral Axillary  Resp: 16 18 18 18   Height:      Weight:      SpO2: 97% 98%     General: alert, cooperative and no distress Lochia: appropriate Uterine Fundus: firm Incision: Healing well with no significant drainage, No significant erythema DVT Evaluation: No evidence of DVT seen on physical exam. Mild pedal edema Labs: Lab Results   Component Value Date   WBC 8.2 07/27/2015   HGB 8.0* 07/27/2015   HCT 25.7* 07/27/2015   MCV 79.1 07/27/2015   PLT 152 07/27/2015   CMP Latest Ref Rng 07/23/2015  Glucose 65 - 99 mg/dL 92  BUN 6 - 20 mg/dL <5(L)  Creatinine 0.44 - 1.00 mg/dL 0.47  Sodium 135 - 145 mmol/L 136  Potassium 3.5 - 5.1 mmol/L 3.9  Chloride 101 - 111 mmol/L 106  CO2 22 - 32 mmol/L 19(L)  Calcium 8.9 - 10.3 mg/dL 8.5(L)  Total Protein 6.5 - 8.1 g/dL -  Total Bilirubin 0.3 - 1.2 mg/dL -  Alkaline Phos 38 - 126 U/L -  AST 15 - 41 U/L -  ALT 14 - 54 U/L -    Discharge instruction: per After Visit Summary and "Baby and Me Booklet".  After visit meds:    Medication List    STOP taking these medications        ACCU-CHEK FASTCLIX LANCETS Misc     cephALEXin 500 MG capsule  Commonly known as:  KEFLEX  glucose blood test strip      TAKE these medications        acetaminophen 500 MG tablet  Commonly known as:  TYLENOL  Take 1,000 mg by mouth every 6 (six) hours as needed for moderate pain.     calcium carbonate 500 MG chewable tablet  Commonly known as:  TUMS - dosed in mg elemental calcium  Chew 1 tablet by mouth 2 (two) times daily as needed for indigestion or heartburn.     ibuprofen 600 MG tablet  Commonly known as:  ADVIL,MOTRIN  Take 1 tablet (600 mg total) by mouth every 6 (six) hours as needed for mild pain.     oxyCODONE-acetaminophen 5-325 MG tablet  Commonly known as:  PERCOCET/ROXICET  Take 1-2 tablets by mouth every 4 (four) hours as needed (pain scale 4-7).     Prenatal Vitamins 28-0.8 MG Tabs  Take 1 tablet by mouth daily.     ranitidine 75 MG tablet  Commonly known as:  ZANTAC  Take 2 tablets (150 mg total) by mouth 2 (two) times daily.     senna-docusate 8.6-50 MG tablet  Commonly known as:  Senokot-S  Take 2 tablets by mouth daily.        Diet: routine diet  Activity: Advance as tolerated. Pelvic rest for 2 weeks.   Outpatient follow up:6 weeks Follow up  Appt:Future Appointments Date Time Provider [place]  08/09/2015 1:15 PM Osborne Oman, MD [facility]  09/09/2015 8:15 AM Donnamae Jude, MD [facility]   Follow up Visit:No Follow-up on file. Follow-up Information    Follow up with [facility] [place] [facility]. Schedule an appointment as soon as possible for a visit in 6 weeks.   Specialty:  Obstetrics and Gynecology   Why:  post partum check   Contact information:   [address] [place] [phone]      Postpartum contraception: Tubal Ligation  Newborn Data: Live born female  Birth Weight: 7 lb 6.2 oz (3351 g) APGAR: 9, 9  Baby Feeding: Breast Disposition:home with mother   07/28/2015 Cyndia Diver, MD   OB FELLOW DISCHARGE ATTESTATION  I have seen and examined this patient and agree with above documentation in the resident's note.   Desma Maxim, MD 9:43 AM

## 2015-07-28 NOTE — Lactation Note (Signed)
This note was copied from a baby's chart. Lactation Consultation Note  Discussed waking techniques, supply and demand and the importance of establishing her milk supply. Mother states baby was hungry during the night so she gave baby formula.  Provided education about cluster feeding. Recommend parents do not go more than 3.5 hours without feeding baby.  Set an alarm if needed. Reviewed engorgement care and monitoring voids/stools. Parents asked about pumping so mother can sleep.  Discussed how mother needs to empty her breasts during the 3 hour window to protect her milk supply. Mom encouraged to feed baby 8-12 times/24 hours and with feeding cues.  Suggest mother breastfeed before offering formula. Encouraged parents to call [facility] if interested in getting a DEBP.  Patient Name: [name] [id number] Date: 07/28/2015 Reason for consult: Follow-up assessment   Maternal Data    Feeding Feeding Type: Formula  LATCH Score/Interventions                      Lactation Tools Discussed/Used     Consult Status Consult Status: Complete    Carlye Grippe 07/28/2015, 10:04 AM

## 2015-08-09 ENCOUNTER — Encounter: Payer: Self-pay | Admitting: Obstetrics & Gynecology

## 2015-08-09 ENCOUNTER — Ambulatory Visit (INDEPENDENT_AMBULATORY_CARE_PROVIDER_SITE_OTHER): Payer: Medicaid Other | Admitting: Obstetrics & Gynecology

## 2015-08-09 VITALS — BP 115/78 | HR 80 | Ht 60.5 in | Wt 191.5 lb

## 2015-08-09 DIAGNOSIS — O24410 Gestational diabetes mellitus in pregnancy, diet controlled: Secondary | ICD-10-CM

## 2015-08-09 DIAGNOSIS — Z4889 Encounter for other specified surgical aftercare: Secondary | ICD-10-CM

## 2015-08-09 DIAGNOSIS — Z8632 Personal history of gestational diabetes: Secondary | ICD-10-CM

## 2015-08-09 NOTE — Progress Notes (Signed)
Here today for incision check.  Csection was 07/26/15.  Still having just light bleeding. Some tenderness around incision site. Has had unprotected intercourse, first time was 5-6 days post op.

## 2015-08-09 NOTE — Progress Notes (Signed)
   CLINIC ENCOUNTER NOTE  History:  30 y.o. [id number] here today for two week incision check s/p RCS and BTS on 07/26/15.  Reports mild tenderness around incision, no drainage or other concerns. Pain is well controlled on NSAIDS. Baby is doing well.   Past Medical History  Diagnosis Date  . Kidney stones   . Uterine fibroid   . Migraine   . Gestational diabetes mellitus (GDM), antepartum   . GERD (gastroesophageal reflux disease)     with pregnancy  . Gestational diabetes     with current pregnancy  . Blood dyscrasia     takes 11 1/2 minutes to clot    Past Surgical History  Procedure Laterality Date  . Renal artery stent      placed and removed in 2005  . Cesarean section      Nov 2011  . Cesarean section with bilateral tubal ligation Bilateral 07/26/2015    Procedure: C section ;  Surgeon: Mora Bellman, MD;  Location: [facility];  Service: Obstetrics;  Laterality: Bilateral;  . Tubal ligation Bilateral 07/26/2015    Procedure: BILATERAL TUBAL LIGATION;  Surgeon: Mora Bellman, MD;  Location: [facility];  Service: Obstetrics;  Laterality: Bilateral;    The following portions of the patient's history were reviewed and updated as appropriate: allergies, current medications, past family history, past medical history, past social history, past surgical history and problem list.   Review of Systems:  Pertinent items noted in HPI and remainder of comprehensive ROS otherwise negative.  Objective:  Physical Exam BP 115/78 mmHg  Pulse 80  Ht 5' 0.5" (1.537 m)  Wt 191 lb 8 oz (86.864 kg)  BMI 36.77 kg/m2  LMP 10/26/2014 (Exact Date)  Breastfeeding? No CONSTITUTIONAL: Well-developed, well-nourished female in no acute distress.  HENT:  Normocephalic, atraumatic. External right and left ear normal. Oropharynx is clear and moist EYES: Conjunctivae and EOM are normal. Pupils are equal, round, and reactive to light. No scleral icterus.  NECK: Normal range of motion, supple, no masses SKIN:  Skin is warm and dry. No rash noted. Not diaphoretic. No erythema. No pallor. NEUROLOGIC: Alert and oriented to person, place, and time. Normal reflexes, muscle tone coordination. No cranial nerve deficit noted. PSYCHIATRIC: Normal mood and affect. Normal behavior. Normal judgment and thought content. CARDIOVASCULAR: Normal heart rate noted RESPIRATORY: Effort and breath sounds normal, no problems with respiration noted ABDOMEN: Soft, no distention noted, incision is C/D/I and no erythema noted after steristrips were removed.  No drainage.   PELVIC: Deferred MUSCULOSKELETAL: Normal range of motion. No edema noted.   Assessment & Plan:  1. Encounter for postoperative wound check Incision is healing well.  Reinforced proper postoperative activity.  2. Diet controlled gestational diabetes mellitus (GDM), antepartum Will do 2 hr GTT at next visit/formal postpartum visit.    Verita Schneiders, MD, [place] Attending Obstetrician & Gynecologist, [place] for [facility]

## 2015-09-09 ENCOUNTER — Ambulatory Visit (INDEPENDENT_AMBULATORY_CARE_PROVIDER_SITE_OTHER): Payer: Medicaid Other | Admitting: Family Medicine

## 2015-09-09 ENCOUNTER — Encounter: Payer: Self-pay | Admitting: Family Medicine

## 2015-09-09 VITALS — BP 136/84 | HR 75 | Temp 97.8°F | Wt 198.5 lb

## 2015-09-09 DIAGNOSIS — Z8632 Personal history of gestational diabetes: Secondary | ICD-10-CM

## 2015-09-09 DIAGNOSIS — O2493 Unspecified diabetes mellitus in the puerperium: Secondary | ICD-10-CM

## 2015-09-09 NOTE — Patient Instructions (Signed)
Postpartum Tubal Ligation Postpartum tubal ligation (PPTL) is a procedure that closes the fallopian tubes right after childbirth or 1-2 days after childbirth. PPTL is done before the uterus returns to its normal location. The procedure is also called a mini-laparotomy. When the fallopian tubes are closed, the eggs that are released from the ovaries cannot enter the uterus, and sperm cannot reach the egg. PPTL is done so you will not be able to get pregnant or have a baby. Although this procedure may be undone (reversed), it should be considered permanent and irreversible. If you want to have future pregnancies, you should not have this procedure. LET YOUR HEALTH CARE PROVIDER KNOW ABOUT:  Any allergies you have.  All medicines you are taking, including vitamins, herbs, eye drops, creams, and over-the-counter medicines. This includes any use of steroids, either by mouth or in cream form.  Previous problems you or members of your family have had with the use of anesthetics.  Any blood disorders you have.  Previous surgeries you have had.  Any medical conditions you may have. RISKS AND COMPLICATIONS  Infection.  Bleeding.  Injury to surrounding organs.  Side effects from anesthetics.  Failure of the procedure.  Ectopic pregnancy.  Future regret about having the procedure done. BEFORE THE PROCEDURE  You may need to sign certain documents, including an informed consent form, up to 30 days before the date of your tubal ligation.  Follow instructions from your health care provider about eating and drinking restrictions. PROCEDURE  If done 1-2 days after a vaginal delivery:  You will be given one or more of the following:  A medicine that helps you relax (sedative).  A medicine that numbs the area (local anesthetic).  A medicine that makes you fall asleep (general anesthetic).  A medicine that is injected into an area of your body that numbs everything below the injection site  (regional anesthetic).  If you have been given general anesthetic, a tube will be put down your throat to help you breathe.  Your bladder may be emptied with a small tube (catheter).  A small cut (incision) will be made just above the pubic hair line.  The fallopian tubes will be located and brought up through the incision.  The fallopian tubes will be tied off or burned (cauterized), or they will be closed with a clamp, ring, or clip. In many cases, a small portion in the center of each fallopian tube will also be removed.  The incision will be closed with stitches (sutures).  A bandage (dressing) will be placed over the incision. The procedure may vary among health care providers and hospitals. If done after a cesarean delivery:  Tubal ligation will be done through the incision that was used for the cesarean delivery of your baby.  After the tubes are closed, the incision will be closed with stitches (sutures).  A bandage (dressing) will be placed over the incision. The procedure may vary among health care providers and hospitals. AFTER THE PROCEDURE  Your blood pressure, heart rate, breathing rate, and blood oxygen level will be monitored often until the medicines you were given have worn off.  You will be given pain medicine as needed.  If you had general anesthetic, you may have some mild discomfort in your throat. This is from the breathing tube that was placed in your throat while you were sleeping.  You may feel tired, and you should rest for the remainder of the day.  You may have some pain   or cramps in the abdominal area for 3-7 days.   This information is not intended to replace advice given to you by your health care provider. Make sure you discuss any questions you have with your health care provider.   Document Released: 06/05/2005 Document Revised: 10/20/2014 Document Reviewed: 09/16/2011 Elsevier Interactive Patient Education 2016 [employer]  

## 2015-09-09 NOTE — Progress Notes (Signed)
Patient ID: [name], female   DOB: 1985/06/21, 30 y.o.   MRN: [id number] Subjective:     [name] is a 30 y.o. female who presents for a postpartum visit. She is 6 weeks postpartum following a c-section on 07/26/2015. I have fully reviewed the prenatal and intrapartum course. The delivery was at 34 gestational weeks. . Anesthesia: Spinal Tap. Postpartum course has been.  63 course has been completed  Baby is feeding by bottle. No bleeding at this time. Bowel function is normal. Bladder function is normal. Patient is sexually active. Contraception method Tubal Ligation . Postpartum depression screening negative.  The following portions of the patient's history were reviewed and updated as appropriate: allergies, current medications, past family history, past medical history, past social history, past surgical history and problem list.  Review of Systems Pertinent items noted in HPI and remainder of comprehensive ROS otherwise negative.   Objective:    BP 136/84 mmHg  Pulse 75  Temp(Src) 97.8 F (36.6 C)  Wt 198 lb 8 oz (90.039 kg)  General:  alert, cooperative and appears stated age  Lungs: normal effort  Heart:  regular rate and rhythm  Abdomen: soft, non-tender; bowel sounds normal; no masses,  no organomegaly        Assessment:     Normal postpartum exam.GDM this pregnancy. Pap smear not done at today's visit.   Plan:    1. Contraception: tubal ligation 2. 2 hour today 3. Follow up in: 1 year or as needed.

## 2015-09-10 LAB — GLUCOSE TOLERANCE, 2 HOURS
GLUCOSE, 2 HOUR: 123 mg/dL (ref <140)
GLUCOSE, FASTING: 96 mg/dL (ref 65–99)

## 2015-11-14 IMAGING — US US MFM OB COMP +14 WKS
1 series · 14 of 28 positions shown · non-contrast
Comparison: none

[Series 1: us mfm ob comp +14 wks · 14 of 72 slices shown]
[im 3/72]
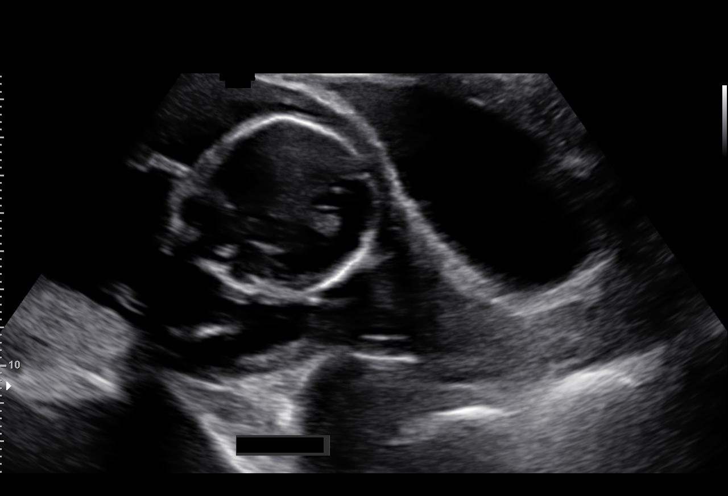
[im 8/72]
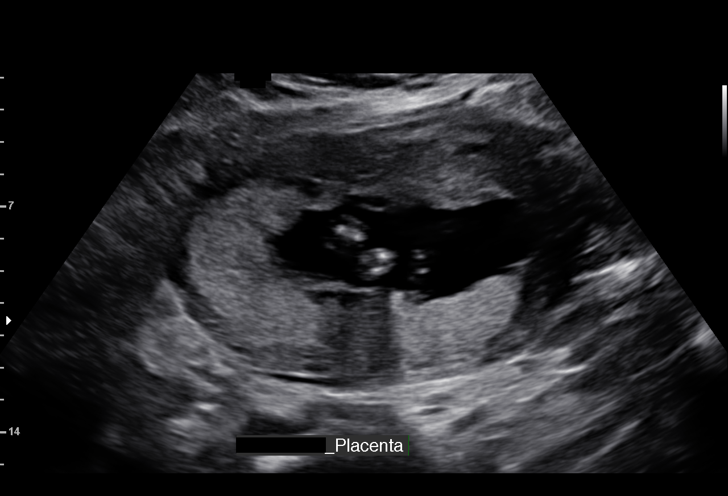
[im 14/72]
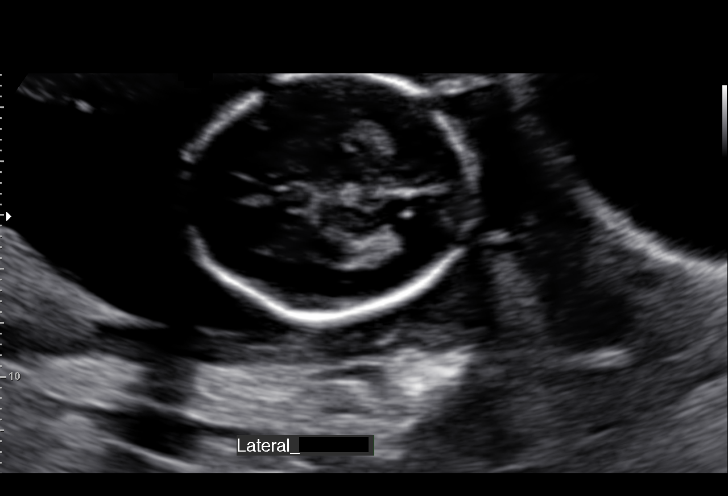
[im 19/72]
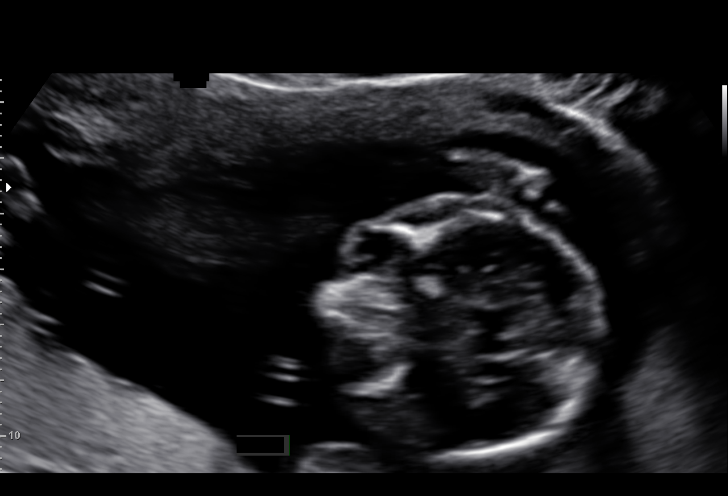
[im 24/72]
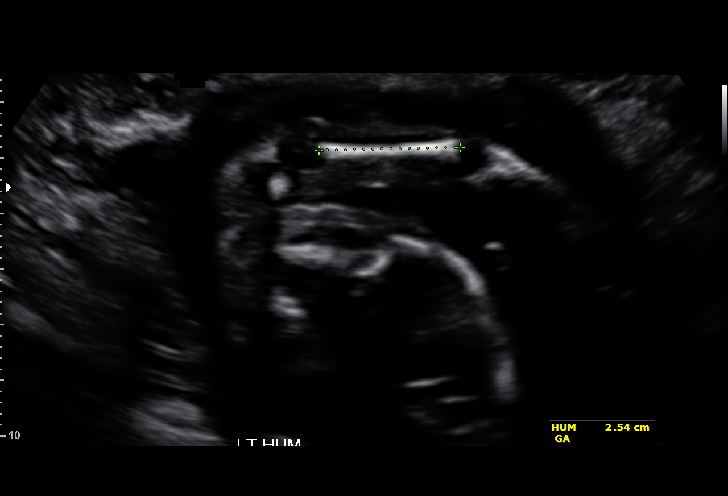
[im 29/72]
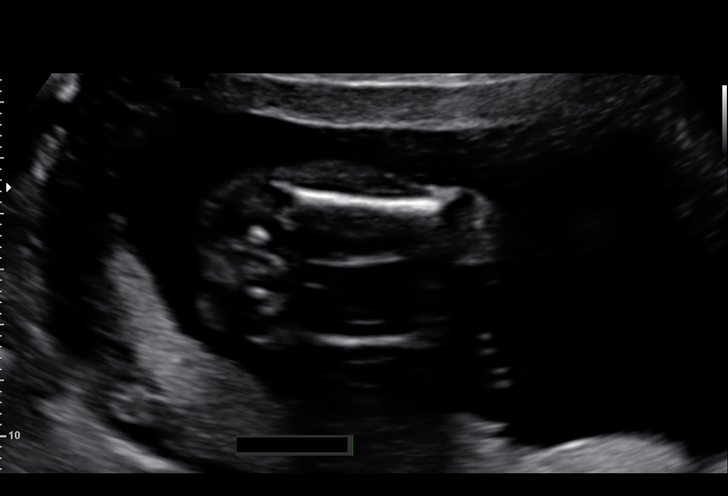
[im 35/72]
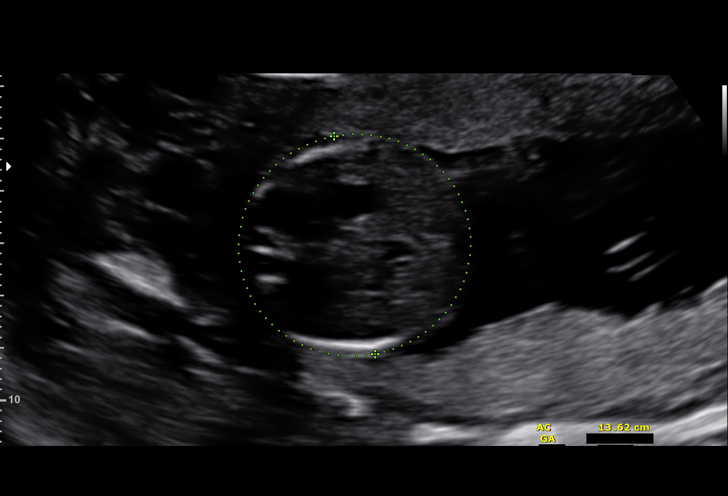
[im 40/72]
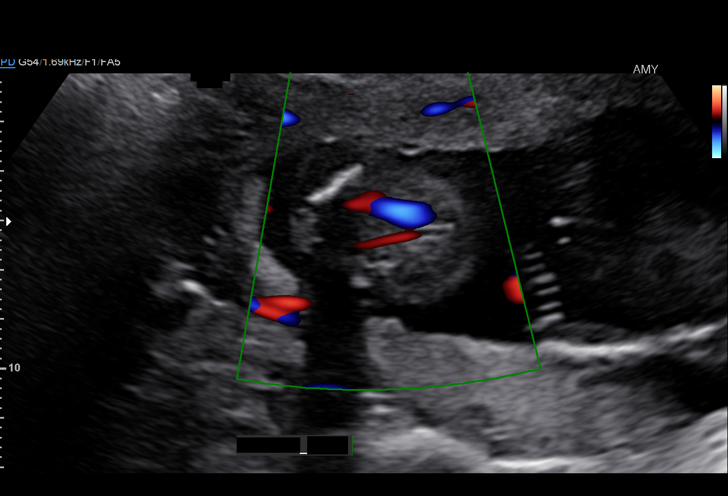
[im 45/72]
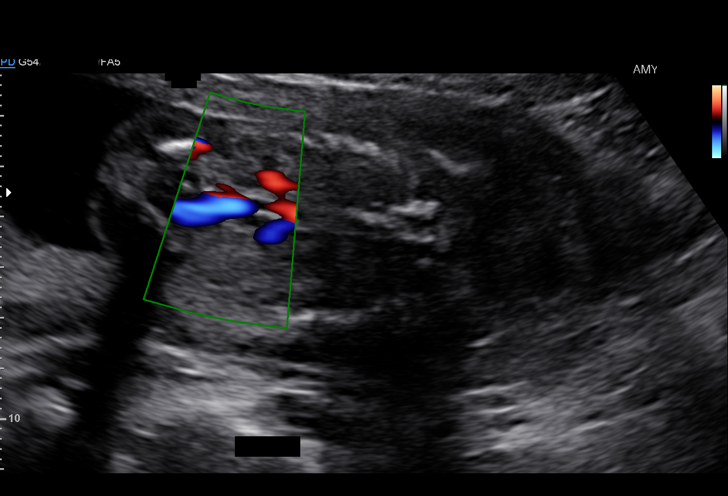
[im 50/72]
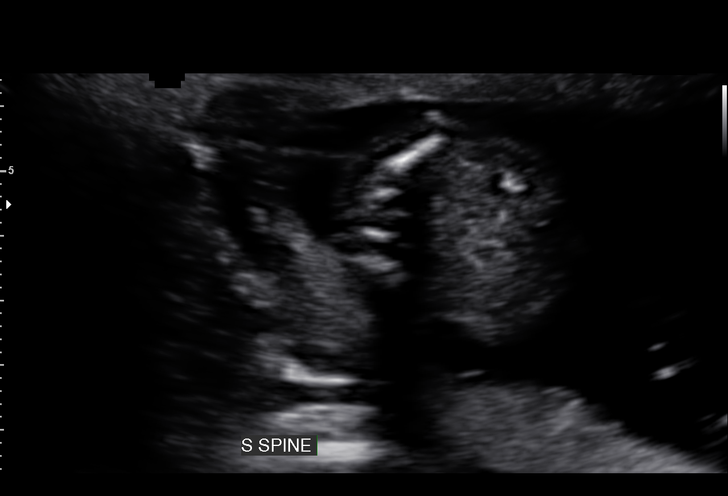
[im 56/72]
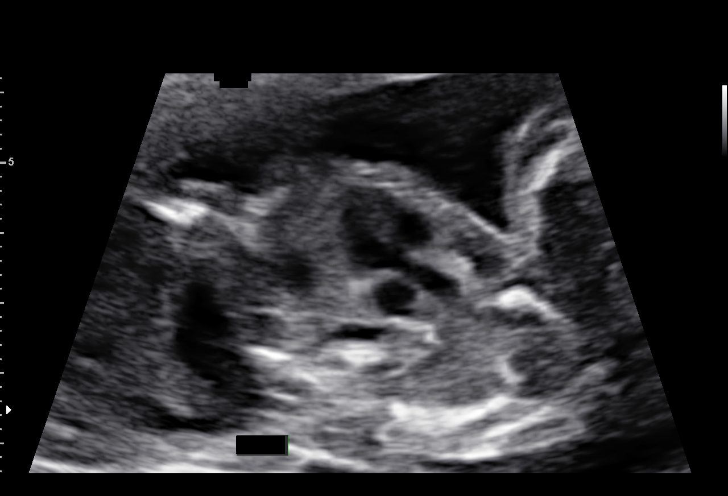
[im 61/72]
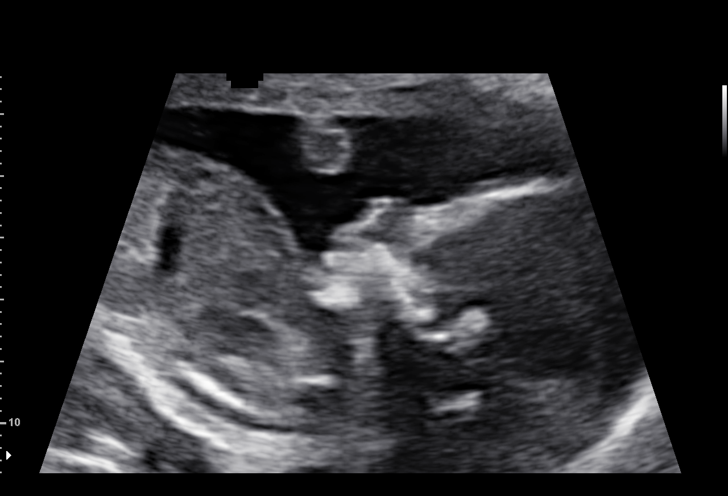
[im 66/72]
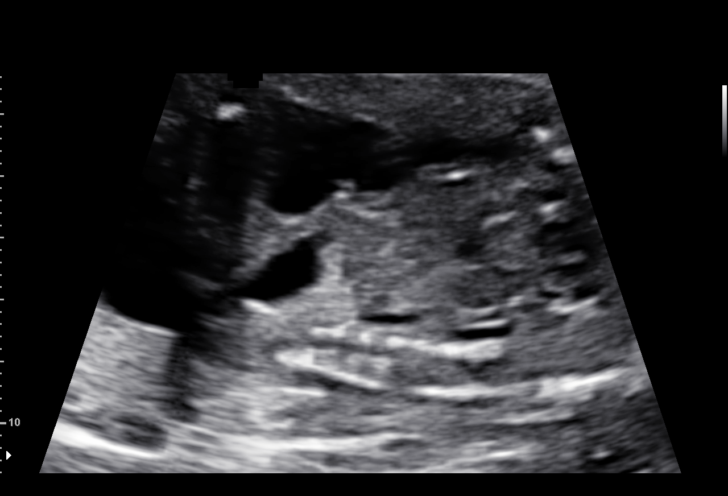
[im 72/72]
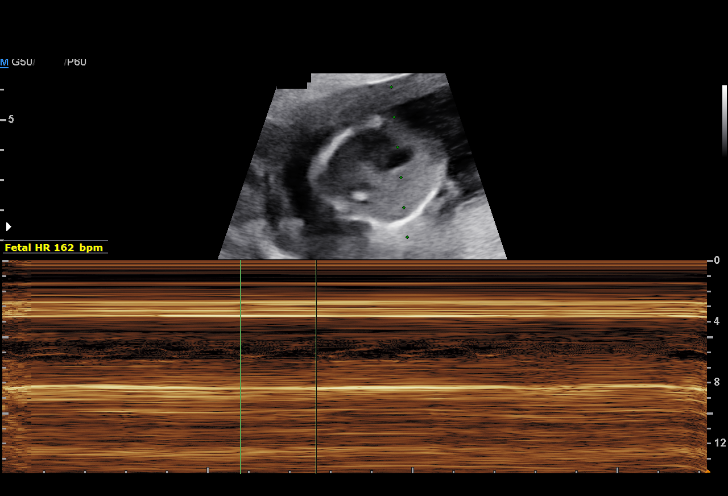

[14 of 28 positions shown; findings below may reference images not displayed]

OBSTETRICS REPORT
(Signed Final 03/04/2015 [DATE])

Name:       [HOSPITAL] NOMASIBULELE MOATSHE                     Visit  03/04/2015 [DATE]
Date:

Service(s) Provided

Indications

Basic anatomic survey                                  Z36
18 weeks gestation of pregnancy
Previous cesarean delivery, antepartum
Fetal Evaluation

Num Of             1
Fetuses:
Fetal Heart        162                          bpm
Rate:
Cardiac Activity:  Observed
Presentation:      Cephalic
Placenta:          Posterior, above cervical
os
P. Cord            Visualized
Insertion:

Amniotic Fluid
AFI FV:      Subjectively within normal limits
Larg Pckt:      4.1  cm
Biometry

BPD:       46   m    G. Age:   19w 6d                 CI:        76.12   70 - 86
m
FL/HC:      15.7   15.8 -
18
HC:     167.1   m    G. Age:   19w 3d        84  %    HC/AC:      1.20   1.07 -
m
AC:     139.6   m    G. Age:   19w 3d        76  %    FL/BPD
m                                     :
FL:      26.2   m    G. Age:   18w 0d        28  %    FL/AC:      18.8   20 - 24
m
HUM:     25.4   m    G. Age:   18w 0d        40  %
m
CER:     19.4   m    G. Age:   18w 5d        59  %
m
NFT:       3.4  m
m

Est.         258   gm    0 lb 9 oz      53   %
FW:
Gestational Age

LMP:           18w 3d        Date:  10/26/14                  EDD:   08/02/15
U/S Today:     19w 1d                                         EDD:   07/28/15
Best:          18w 3d    Det. By:   LMP  (10/26/14)           EDD:   08/02/15
Anatomy

Cranium:          Appears normal         Aortic Arch:       Appears normal
Fetal Cavum:      Appears normal         Ductal Arch:       Appears normal
Ventricles:       Appears normal         Diaphragm:         Appears normal
Choroid Plexus:   Appears normal         Stomach:           Appears normal,
left sided
Cerebellum:       Appears normal         Abdomen:           Appears normal
Posterior         Appears normal         Abdominal          Appears nml (cord
Fossa:                                   Wall:              insert, abd wall)
Nuchal Fold:      Appears normal         Cord Vessels:      Appears normal (3
vessel cord)
Face:             Appears normal         Kidneys:           Appear normal
(orbits and profile)
Lips:             Appears normal         Bladder:           Appears normal
Heart:            Appears normal         Spine:             Not well visualized
(4CH, axis, and
situs)
RVOT:             Appears normal         Lower              Appears normal
Extremities:
LVOT:             Appears normal         Upper              Appears normal
Extremities:

Other:   Fetus appears to be a male. Heels appears normal.
Targeted Anatomy

Fetal Central Nervous System
Cisterna
Magna:
Cervix Uterus Adnexa

Cervical Length:    3.3       cm

Cervix:       Normal appearance by transabdominal scan.
Appears closed, without funnelling.

Left Ovary:    Not visualized. No adnexal mass visualized.
Right Ovary:   Not visualized. No adnexal mass visualized.
Comments

The fetal spine was not well visualized.
Impression

Single living intrauterine pregnancy at 18 weeks 3 days.
Appropriate fetal growth (53%).
Normal amniotic fluid volume.
The fetal anatomic survey is not complete.
No gross fetal anomalies identified.
Recommendations

Recommend follow-up ultrasound examination in 4 weeks
to reassess fetal growth and anatomy.

## 2016-01-14 ENCOUNTER — Encounter (HOSPITAL_COMMUNITY): Payer: Self-pay | Admitting: Emergency Medicine

## 2016-01-14 ENCOUNTER — Emergency Department (HOSPITAL_COMMUNITY)
Admission: EM | Admit: 2016-01-14 | Discharge: 2016-01-15 | Disposition: A | Discharge status: Home / Self Care | Payer: Medicaid Other | Attending: Emergency Medicine | Admitting: Emergency Medicine

## 2016-01-14 DIAGNOSIS — H1132 Conjunctival hemorrhage, left eye: Secondary | ICD-10-CM | POA: Diagnosis not present

## 2016-01-14 DIAGNOSIS — H538 Other visual disturbances: Secondary | ICD-10-CM | POA: Diagnosis present

## 2016-01-14 DIAGNOSIS — Z91040 Latex allergy status: Secondary | ICD-10-CM | POA: Insufficient documentation

## 2016-01-14 MED ORDER — FLUORESCEIN SODIUM 1 MG OP STRP
1.0000 | ORAL_STRIP | Freq: Once | OPHTHALMIC | Status: AC
  Administered 2016-01-14: 1 via OPHTHALMIC
  Filled 2016-01-14: qty 1

## 2016-01-14 MED ORDER — TETRACAINE HCL 0.5 % OP SOLN
2.0000 [drp] | Freq: Once | OPHTHALMIC | Status: AC
Start: 2016-01-14 — End: 2016-01-14
  Administered 2016-01-14: 2 [drp] via OPHTHALMIC
  Filled 2016-01-14: qty 2

## 2016-01-14 NOTE — ED Provider Notes (Signed)
[facility] DEPT Provider Note   CSN: [id number] Arrival date & time: 01/14/16  1959 By signing my name below, I, Emmanuella Mensah, attest that this documentation has been prepared under the direction and in the presence of [employer], PA-C. Electronically Signed: Judithann Sauger, ED Scribe. 01/14/16. 11:41 PM.    History   Chief Complaint Chief Complaint  Patient presents with  . Eye Problem    HPI Comments: Ala A Cherlyn Cushing is a 30 y.o. female with a hx of migraines who presents to the Emergency Department complaining of ongoing left eye redness onset today. Pt reports associated blurred vision out of her left eye and left frontal headache. She adds that these headaches are consistent with her migraines but not as severe. She denies that her eye pain is worse with blinking. She states that she has a hx of allergies and normally has bilateral eye puffiness with sinus congestion and sinus headaches. She denies any recent injuries or trauma but explains that she has been sleeping with her 1-month old on her chest and sometimes he moves his arm around and is unsure if he hit her while sleeping. No alleviating factors noted. Pt has not tried any medications PTA. No fever or chills, nausea or vomiting.    The history is provided by the patient. No language interpreter was used.    Past Medical History:  Diagnosis Date  . Blood dyscrasia    takes 11 1/2 minutes to clot  . GERD (gastroesophageal reflux disease)    with pregnancy  . Gestational diabetes    with current pregnancy  . Gestational diabetes mellitus (GDM), antepartum   . Kidney stones   . Migraine   . Uterine fibroid     Patient Active Problem List   Diagnosis Date Noted  . Esophageal reflux   . History of gestational diabetes 06/10/2015    Past Surgical History:  Procedure Laterality Date  . CESAREAN SECTION     Nov 2011  . CESAREAN SECTION WITH BILATERAL TUBAL LIGATION Bilateral 07/26/2015   Procedure: C  section ;  Surgeon: Mora Bellman, MD;  Location: [facility];  Service: Obstetrics;  Laterality: Bilateral;  . RENAL ARTERY STENT     placed and removed in 2005  . TUBAL LIGATION Bilateral 07/26/2015   Procedure: BILATERAL TUBAL LIGATION;  Surgeon: Mora Bellman, MD;  Location: [facility];  Service: Obstetrics;  Laterality: Bilateral;    OB History    Gravida Para Term Preterm AB Living   3 2 2   1 2    SAB TAB Ectopic Multiple Live Births   1     0         Home Medications    Prior to Admission medications   Medication Sig Start Date End Date Taking? Authorizing Provider  Prenatal Vit-Fe Fumarate-FA (PRENATAL VITAMINS) 28-0.8 MG TABS Take 1 tablet by mouth daily. 07/28/15   Cyndia Diver, MD    Family History Family History  Problem Relation Age of Onset  . Hypertension Mother   . Depression Mother   . Diabetes Father   . Hypertension Father   . Heart disease Father   . Cancer Father     foot    Social History Social History  Substance Use Topics  . Smoking status: Never Smoker  . Smokeless tobacco: Never Used  . Alcohol use 0.0 oz/week     Comment: occ     Allergies   Erythromycin; Ciprofloxacin; Latex; and Watermelon flavor   Review of Systems  Review of Systems  Constitutional: Negative for chills and fever.  Eyes: Positive for pain, redness and visual disturbance.  Neurological: Positive for headaches.     Physical Exam Updated Vital Signs BP 128/79 (BP Location: Left Arm)   Pulse 65   Temp 97.8 F (36.6 C) (Oral)   Resp 18   Ht 5' 0.5" (1.537 m)   Wt 207 lb (93.9 kg)   LMP 12/21/2015 (Approximate)   SpO2 100%   BMI 39.76 kg/m   Physical Exam  Constitutional: She appears well-developed and well-nourished. No distress.  HENT:  Head: Normocephalic and atraumatic.  Eyes: EOM are normal. Pupils are equal, round, and reactive to light. Right eye exhibits no discharge and no exudate. Left eye exhibits no discharge and no exudate. No foreign body present  in the left eye. Right conjunctiva is not injected. Right conjunctiva has no hemorrhage. Left conjunctiva is not injected. Left conjunctiva has a hemorrhage.  Fundoscopic exam:      The right eye shows no AV nicking and no hemorrhage. The right eye shows red reflex.       The left eye shows no AV nicking and no hemorrhage. The left eye shows red reflex.  Slit lamp exam:      The left eye shows no corneal abrasion, no corneal ulcer and no foreign body.  Hematoma noted to lateral aspect of left eye. Eye pressure: right: 18, left: 17    Pulmonary/Chest: Effort normal. No respiratory distress.  Musculoskeletal: Normal range of motion.  Neurological: She is alert. Coordination normal.  Skin: Skin is warm and dry. She is not diaphoretic.  Psychiatric: She has a normal mood and affect. Her behavior is normal.  Nursing note and vitals reviewed.    ED Treatments / Results  DIAGNOSTIC STUDIES: Oxygen Saturation is 100% on RA, normal by my interpretation.    COORDINATION OF CARE: 10:35 PM- Pt advised of plan for treatment and pt agrees. Will use fluorescein strip and tetracaine to perform eye examination for further evaluation.    Labs (all labs ordered are listed, but only abnormal results are displayed) Labs Reviewed - No data to display  EKG  EKG Interpretation None       Radiology No results found.  Procedures Procedures (including critical care time)  Medications Ordered in ED Medications  fluorescein ophthalmic strip 1 strip (1 strip Left Eye Given 01/14/16 2302)  tetracaine (PONTOCAINE) 0.5 % ophthalmic solution 2 drop (2 drops Both Eyes Given 01/14/16 2303)     Initial Impression / Assessment and Plan / ED Course  Jackson Latino, PA-C has reviewed the triage vital signs and the nursing notes.  Pertinent labs & imaging results that were available during my care of the patient were reviewed by me and considered in my medical decision making (see chart for  details).  Clinical Course     Pt dx likely Subconjunctival hematoma based on presentation & eye exam, no antibiotics are indicated.  No known history of trauma. Patient believes this could be related to her sinus congestion/sinus headaches. No evidence of HSV or VSV infection. Pt is not a contact lens wearer.  Exam non-concerning for orbital cellulitis, conjunctivitis, hyphema, corneal ulcers, corneal abrasions or trauma.  Normal IOP's.    Instructed patient to follow-up with ophthalmology tomorrow. Instructed patient to return to the ED if new symptoms including change in vision, purulent drainage, or entrapment occur.    Patient presents understanding to the discharge instructions.  I personally performed the services  described in this documentation, which was scribed in my presence. The recorded information has been reviewed and is accurate.     Final Clinical Impressions(s) / ED Diagnoses   Final diagnoses:  Subconjunctival hemorrhage of left eye     New Prescriptions New Prescriptions   No medications on file     Kalman Drape, [place] [id number]    Delora Fuel, MD [id number]

## 2016-01-14 NOTE — ED Triage Notes (Addendum)
Pt. reports redness at left eye onset yesterday, denies injury or pain  , mild blurred vision .

## 2016-01-15 NOTE — Discharge Instructions (Signed)
Follow up with Dr. Alanda Slim or another ophthalmologist tomorrow to have your eye reevaluated.  Return to the emergency department if you experience pain with eye movements, swelling around your eye, decrease in vision, worsening redness, worsening headache, or any other concerning symptoms.

## 2016-01-20 ENCOUNTER — Encounter (HOSPITAL_COMMUNITY): Payer: Self-pay | Admitting: *Deleted

## 2016-01-20 NOTE — Progress Notes (Signed)
Pt states she had a heart murmur as a child but "I outgrew it". Pt denies chest pain or sob.

## 2016-01-21 ENCOUNTER — Ambulatory Visit (HOSPITAL_COMMUNITY): Payer: Medicaid Other | Admitting: Certified Registered Nurse Anesthetist

## 2016-01-21 ENCOUNTER — Encounter (HOSPITAL_COMMUNITY): Payer: Self-pay | Admitting: Surgery

## 2016-01-21 ENCOUNTER — Ambulatory Visit (HOSPITAL_COMMUNITY)
Admission: RE | Admit: 2016-01-21 | Discharge: 2016-01-21 | Disposition: A | Discharge status: Home / Self Care | Payer: Medicaid Other | Source: Ambulatory Visit | Attending: Oral Surgery | Admitting: Oral Surgery

## 2016-01-21 ENCOUNTER — Encounter (HOSPITAL_COMMUNITY)
Admission: RE | Disposition: A | Discharge status: Home / Self Care | Payer: Self-pay | Source: Ambulatory Visit | Attending: Oral Surgery

## 2016-01-21 DIAGNOSIS — Z6839 Body mass index (BMI) 39.0-39.9, adult: Secondary | ICD-10-CM | POA: Diagnosis not present

## 2016-01-21 DIAGNOSIS — K0530 Chronic periodontitis, unspecified: Secondary | ICD-10-CM | POA: Diagnosis not present

## 2016-01-21 DIAGNOSIS — K029 Dental caries, unspecified: Secondary | ICD-10-CM | POA: Diagnosis present

## 2016-01-21 DIAGNOSIS — M270 Developmental disorders of jaws: Secondary | ICD-10-CM | POA: Diagnosis not present

## 2016-01-21 HISTORY — PX: TOOTH EXTRACTION: SHX859

## 2016-01-21 HISTORY — DX: Personal history of other diseases of the digestive system: Z87.19

## 2016-01-21 HISTORY — DX: Cardiac murmur, unspecified: R01.1

## 2016-01-21 HISTORY — DX: Anemia, unspecified: D64.9

## 2016-01-21 LAB — HEMOGLOBIN: HEMOGLOBIN: 11.4 g/dL — AB (ref 12.0–15.0)

## 2016-01-21 LAB — HCG, SERUM, QUALITATIVE: Preg, Serum: NEGATIVE

## 2016-01-21 SURGERY — DENTAL RESTORATION/EXTRACTIONS
Anesthesia: General

## 2016-01-21 MED ORDER — FENTANYL CITRATE (PF) 100 MCG/2ML IJ SOLN
INTRAMUSCULAR | Status: DC | PRN
  Administered 2016-01-21 (×2): 50 ug via INTRAVENOUS
  Administered 2016-01-21: 100 ug via INTRAVENOUS

## 2016-01-21 MED ORDER — OXYCODONE-ACETAMINOPHEN 5-325 MG PO TABS
1.0000 | ORAL_TABLET | Freq: Once | ORAL | Status: AC
  Administered 2016-01-21: 1 via ORAL

## 2016-01-21 MED ORDER — SUCCINYLCHOLINE CHLORIDE 200 MG/10ML IV SOSY
PREFILLED_SYRINGE | INTRAVENOUS | Status: AC
  Filled 2016-01-21: qty 20

## 2016-01-21 MED ORDER — PROPOFOL 10 MG/ML IV BOLUS
INTRAVENOUS | Status: DC | PRN
  Administered 2016-01-21: 170 mg via INTRAVENOUS

## 2016-01-21 MED ORDER — DEXAMETHASONE SODIUM PHOSPHATE 10 MG/ML IJ SOLN
INTRAMUSCULAR | Status: AC
  Filled 2016-01-21: qty 1

## 2016-01-21 MED ORDER — CEFAZOLIN SODIUM-DEXTROSE 2-4 GM/100ML-% IV SOLN
INTRAVENOUS | Status: AC
  Filled 2016-01-21: qty 100

## 2016-01-21 MED ORDER — OXYCODONE-ACETAMINOPHEN 5-325 MG PO TABS: 1.0000 | ORAL_TABLET | ORAL | 0 refills | Status: DC | PRN

## 2016-01-21 MED ORDER — DEXAMETHASONE SODIUM PHOSPHATE 10 MG/ML IJ SOLN
INTRAMUSCULAR | Status: DC | PRN
  Administered 2016-01-21: 10 mg via INTRAVENOUS

## 2016-01-21 MED ORDER — ONDANSETRON HCL 4 MG/2ML IJ SOLN
INTRAMUSCULAR | Status: AC
  Filled 2016-01-21: qty 2

## 2016-01-21 MED ORDER — LIDOCAINE-EPINEPHRINE 2 %-1:100000 IJ SOLN
INTRAMUSCULAR | Status: AC
  Filled 2016-01-21: qty 1

## 2016-01-21 MED ORDER — LIDOCAINE-EPINEPHRINE 2 %-1:100000 IJ SOLN
INTRAMUSCULAR | Status: DC | PRN
  Administered 2016-01-21: 20 mL via INTRADERMAL

## 2016-01-21 MED ORDER — MIDAZOLAM HCL 2 MG/2ML IJ SOLN
INTRAMUSCULAR | Status: AC
  Filled 2016-01-21: qty 2

## 2016-01-21 MED ORDER — LACTATED RINGERS IV SOLN
INTRAVENOUS | Status: DC
  Administered 2016-01-21: 09:00:00 via INTRAVENOUS

## 2016-01-21 MED ORDER — PROPOFOL 10 MG/ML IV BOLUS
INTRAVENOUS | Status: AC
  Filled 2016-01-21: qty 20

## 2016-01-21 MED ORDER — ONDANSETRON HCL 4 MG/2ML IJ SOLN
INTRAMUSCULAR | Status: DC | PRN
  Administered 2016-01-21 (×2): 4 mg via INTRAVENOUS

## 2016-01-21 MED ORDER — LIDOCAINE HCL (CARDIAC) 20 MG/ML IV SOLN
INTRAVENOUS | Status: DC | PRN
  Administered 2016-01-21: 100 mg via INTRAVENOUS

## 2016-01-21 MED ORDER — CEFAZOLIN SODIUM-DEXTROSE 2-3 GM-% IV SOLR
INTRAVENOUS | Status: DC | PRN
  Administered 2016-01-21: 2 g via INTRAVENOUS

## 2016-01-21 MED ORDER — EPHEDRINE 5 MG/ML INJ
INTRAVENOUS | Status: AC
  Filled 2016-01-21: qty 10

## 2016-01-21 MED ORDER — MIDAZOLAM HCL 5 MG/5ML IJ SOLN
INTRAMUSCULAR | Status: DC | PRN
  Administered 2016-01-21: 1 mg via INTRAVENOUS

## 2016-01-21 MED ORDER — OXYCODONE-ACETAMINOPHEN 5-325 MG PO TABS
ORAL_TABLET | ORAL | Status: AC
  Filled 2016-01-21: qty 1

## 2016-01-21 MED ORDER — SUCCINYLCHOLINE 20MG/ML (10ML) SYRINGE FOR MEDFUSION PUMP - OPTIME
INTRAMUSCULAR | Status: DC | PRN
  Administered 2016-01-21: 160 mg via INTRAVENOUS

## 2016-01-21 MED ORDER — SODIUM CHLORIDE 0.9 % IR SOLN
Status: DC | PRN
  Administered 2016-01-21 (×2): 1000 mL

## 2016-01-21 MED ORDER — PROMETHAZINE HCL 25 MG/ML IJ SOLN: 6.2500 mg | INTRAMUSCULAR | Status: DC | PRN

## 2016-01-21 MED ORDER — FENTANYL CITRATE (PF) 100 MCG/2ML IJ SOLN: 25.0000 ug | INTRAMUSCULAR | Status: DC | PRN

## 2016-01-21 MED ORDER — FENTANYL CITRATE (PF) 250 MCG/5ML IJ SOLN
INTRAMUSCULAR | Status: AC
  Filled 2016-01-21: qty 5

## 2016-01-21 MED ORDER — PHENYLEPHRINE 40 MCG/ML (10ML) SYRINGE FOR IV PUSH (FOR BLOOD PRESSURE SUPPORT)
PREFILLED_SYRINGE | INTRAVENOUS | Status: AC
  Filled 2016-01-21: qty 10

## 2016-01-21 MED ORDER — SUCCINYLCHOLINE CHLORIDE 200 MG/10ML IV SOSY
PREFILLED_SYRINGE | INTRAVENOUS | Status: AC
  Filled 2016-01-21: qty 10

## 2016-01-21 MED ORDER — OXYMETAZOLINE HCL 0.05 % NA SOLN
NASAL | Status: AC
  Filled 2016-01-21: qty 15

## 2016-01-21 SURGICAL SUPPLY — 29 items
BLADE 10 SAFETY STRL DISP (BLADE) IMPLANT
BUR CROSS CUT FISSURE 1.6 (BURR) IMPLANT
BUR EGG ELITE 4.0 (BURR) ×2 IMPLANT
CANISTER SUCTION 2500CC (MISCELLANEOUS) ×2 IMPLANT
COVER SURGICAL LIGHT HANDLE (MISCELLANEOUS) ×2 IMPLANT
GAUZE PACKING FOLDED 2  STR (GAUZE/BANDAGES/DRESSINGS) ×1
GAUZE PACKING FOLDED 2 STR (GAUZE/BANDAGES/DRESSINGS) ×1 IMPLANT
GLOVE BIO SURGEON STRL SZ 6.5 (GLOVE) ×2 IMPLANT
GLOVE BIO SURGEON STRL SZ7 (GLOVE) IMPLANT
GLOVE BIO SURGEON STRL SZ7.5 (GLOVE) ×2 IMPLANT
GLOVE BIOGEL PI IND STRL 6.5 (GLOVE) IMPLANT
GLOVE BIOGEL PI IND STRL 7.0 (GLOVE) ×1 IMPLANT
GLOVE BIOGEL PI INDICATOR 6.5 (GLOVE)
GLOVE BIOGEL PI INDICATOR 7.0 (GLOVE) ×1
GOWN STRL REUS W/ TWL LRG LVL3 (GOWN DISPOSABLE) ×1 IMPLANT
GOWN STRL REUS W/ TWL XL LVL3 (GOWN DISPOSABLE) ×1 IMPLANT
GOWN STRL REUS W/TWL LRG LVL3 (GOWN DISPOSABLE) ×2
GOWN STRL REUS W/TWL XL LVL3 (GOWN DISPOSABLE) ×2
KIT BASIN OR (CUSTOM PROCEDURE TRAY) ×2 IMPLANT
KIT ROOM TURNOVER OR (KITS) ×2 IMPLANT
NEEDLE 22X1 1/2 (OR ONLY) (NEEDLE) ×4 IMPLANT
NS IRRIG 1000ML POUR BTL (IV SOLUTION) ×2 IMPLANT
PAD ARMBOARD 7.5X6 YLW CONV (MISCELLANEOUS) ×2 IMPLANT
SPONGE SURGIFOAM ABS GEL 12-7 (HEMOSTASIS) IMPLANT
SUT CHROMIC 3 0 PS 2 (SUTURE) ×4 IMPLANT
TOWEL OR 17X24 6PK STRL BLUE (TOWEL DISPOSABLE) IMPLANT
TRAY ENT MC OR (CUSTOM PROCEDURE TRAY) ×2 IMPLANT
TUBING IRRIGATION (MISCELLANEOUS) ×2 IMPLANT
YANKAUER SUCT BULB TIP NO VENT (SUCTIONS) ×2 IMPLANT

## 2016-01-21 NOTE — Transfer of Care (Signed)
Immediate Anesthesia Transfer of Care Note  Patient: [name]  Procedure(s) Performed: Procedure(s): DENTAL RESTORATION/EXTRACTIONS (N/A)  Patient Location: PACU  Anesthesia Type:General  Level of Consciousness: awake, alert , oriented and patient cooperative  Airway & Oxygen Therapy: Patient Spontanous Breathing and Patient connected to face mask oxygen  Post-op Assessment: Report given to RN and Post -op Vital signs reviewed and stable  Post vital signs: Reviewed and stable  Last Vitals:  Vitals:   01/21/16 0955 01/21/16 0956  BP:  (!) 143/86  Pulse:    Resp:    Temp: 36.1 C     Last Pain:  Vitals:   01/21/16 0748  TempSrc: Oral         Complications: No apparent anesthesia complications

## 2016-01-21 NOTE — OR Nursing (Signed)
Pt expressing increasing pain after d/c instruction completed (numbing wearing off). Anesthesiology informed and orders recd. Husband called and informed that patient need to be observed longer following administration of pain medicine.

## 2016-01-21 NOTE — Anesthesia Postprocedure Evaluation (Signed)
Anesthesia Post Note  Patient: [name]  Procedure(s) Performed: Procedure(s) (LRB): DENTAL RESTORATION/EXTRACTIONS (N/A)  Patient location during evaluation: PACU Anesthesia Type: General Level of consciousness: awake and alert Pain management: pain level controlled Vital Signs Assessment: post-procedure vital signs reviewed and stable Respiratory status: spontaneous breathing, nonlabored ventilation, respiratory function stable and patient connected to nasal cannula oxygen Cardiovascular status: blood pressure returned to baseline and stable Postop Assessment: no signs of nausea or vomiting Anesthetic complications: no    Last Vitals:  Vitals:   01/21/16 1010 01/21/16 1025  BP: (!) 131/93 136/84  Pulse:  90  Resp:  17  Temp:      Last Pain:  Vitals:   01/21/16 1010  TempSrc:   PainSc: Asleep                 Kindel Rochefort J

## 2016-01-21 NOTE — Anesthesia Preprocedure Evaluation (Addendum)
Anesthesia Evaluation  Patient identified by MRN, date of birth, ID band Patient awake    Reviewed: Allergy & Precautions, NPO status , Patient's Chart, lab work & pertinent test results  Airway Mallampati: II  TM Distance: >3 FB Neck ROM: Full    Dental no notable dental hx.    Pulmonary neg pulmonary ROS,    Pulmonary exam normal breath sounds clear to auscultation       Cardiovascular negative cardio ROS Normal cardiovascular exam+ Valvular Problems/Murmurs  Rhythm:Regular Rate:Normal     Neuro/Psych negative neurological ROS  negative psych ROS   GI/Hepatic Neg liver ROS, GERD  ,  Endo/Other  negative endocrine ROS  Renal/GU Renal disease  negative genitourinary   Musculoskeletal negative musculoskeletal ROS (+)   Abdominal (+) + obese,   Peds negative pediatric ROS (+)  Hematology  (+) Blood dyscrasia, anemia , H/O elevated bleeding time (11 minutes)   Anesthesia Other Findings   Reproductive/Obstetrics negative OB ROS                           Anesthesia Physical Anesthesia Plan  ASA: III  Anesthesia Plan: General   Post-op Pain Management:    Induction: Intravenous  Airway Management Planned: Oral ETT  Additional Equipment:   Intra-op Plan:   Post-operative Plan: Extubation in OR  Informed Consent: I have reviewed the patients History and Physical, chart, labs and discussed the procedure including the risks, benefits and alternatives for the proposed anesthesia with the patient or authorized representative who has indicated his/her understanding and acceptance.   Dental advisory given  Plan Discussed with: CRNA  Anesthesia Plan Comments:        Anesthesia Quick Evaluation

## 2016-01-21 NOTE — Anesthesia Procedure Notes (Signed)
Procedure Name: Intubation Performed by: Oletta Lamas Pre-anesthesia Checklist: Emergency Drugs available, Suction available, Patient being monitored, Timeout performed and Patient identified Patient Re-evaluated:Patient Re-evaluated prior to inductionOxygen Delivery Method: Circle system utilized Preoxygenation: Pre-oxygenation with 100% oxygen Intubation Type: IV induction Ventilation: Mask ventilation without difficulty Laryngoscope Size: Mac and 3 Grade View: Grade I Tube type: Oral Rae Tube size: 7.0 mm Number of attempts: 1 Placement Confirmation: ETT inserted through vocal cords under direct vision,  positive ETCO2,  CO2 detector and breath sounds checked- equal and bilateral Tube secured with: Tape Dental Injury: Teeth and Oropharynx as per pre-operative assessment

## 2016-01-21 NOTE — Op Note (Signed)
01/21/2016  9:47 AM  PATIENT:  [name]  30 y.o. female  PRE-OPERATIVE DIAGNOSIS:  non-restorable teeth # 1, 2, 3, 4, 5, 6, 7, 8, 9, 10, 11, 12, 13, 14, 15, 16, 17, 18, 19, 20, 28, 29, 31, 32, bilateral mandibular lingual tori  POST-OPERATIVE DIAGNOSIS:  SAME  PROCEDURE:  Procedure(s): DENTAL EXTRACTIONS teeth # 1, 2, 3, 4, 5, 6, 7, 8, 9, 10, 11, 12, 13, 14, 15, 16, 17, 18, 19, 20, 28, 29, 31, 32; ALVEOLOPLASTY MAXILLA AND MANDIBLE BILATERAL. REMOVAL BILATERAL MANDIBULAR LINGUAL TORI  SURGEON:  Surgeon(s): Diona Browner, DDS  ANESTHESIA:   local and general  EBL:  minimal  DRAINS: none   SPECIMEN:  No Specimen  COUNTS:  YES  PLAN OF CARE: Discharge to home after PACU  PATIENT DISPOSITION:  PACU - hemodynamically stable.   PROCEDURE DETAILS: Dictation # [id number]  Gae Bon, DMD 01/21/2016 9:47 AM

## 2016-01-21 NOTE — H&P (Signed)
HISTORY AND PHYSICAL  [name] is a 30 y.o. female patient referred by general dentist for extraction multiple teeth and removal mandibular tori prior to upper denture and lower partial fabrication:  No diagnosis found.  Past Medical History:  Diagnosis Date  . Anemia    after pregnancy  . Blood dyscrasia    takes 11 1/2 minutes to clot - as of 01/20/16 no longer seems to have this problem  . GERD (gastroesophageal reflux disease)    with pregnancy  . Gestational diabetes mellitus (GDM), antepartum   . Heart murmur    states she outgrew it  . History of IBS    in high school  . Kidney stones   . Migraine   . Uterine fibroid     Current Facility-Administered Medications  Medication Dose Route Frequency Provider Last Rate Last Dose  . lactated ringers infusion   Intravenous Continuous Franne Grip, MD       Allergies  Allergen Reactions  . Erythromycin Anaphylaxis, Hives and Rash  . Latex Other (See Comments)    Causes irritation.  Cristopher [place] Flavor Other (See Comments)    REACTION: MIGRAINES   . Ciprofloxacin Nausea And Vomiting   Active Problems:   * No active hospital problems. *  Vitals: Blood pressure 134/88, pulse 84, temperature 97.6 F (36.4 C), temperature source Oral, resp. rate 18, last menstrual period 01/16/2016, SpO2 98 %, not currently breastfeeding. Lab results: Results for orders placed or performed during the hospital encounter of 01/21/16 (from the past 24 hour(s))  Hemoglobin     Status: Abnormal   Collection Time: 01/21/16  7:28 AM  Result Value Ref Range   Hemoglobin 11.4 (L) 12.0 - 15.0 g/dL  hCG, serum, qualitative     Status: None   Collection Time: 01/21/16  7:28 AM  Result Value Ref Range   Preg, Serum NEGATIVE NEGATIVE   Radiology Results: No results found. General appearance: alert, cooperative, no distress and morbidly obese Head: Normocephalic, without obvious abnormality, atraumatic Eyes: negative Nose: Nares normal.  Septum midline. Mucosa normal. No drainage or sinus tenderness. Throat: mulptiple decayed and periodontally involved teeth. bilateral mandibular lingual tori. 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 [facility] Day surgery   Aundrea Horace M 01/21/2016

## 2016-01-21 NOTE — Op Note (Signed)
NAME:  [name], [name]             ACCOUNT NO.:  [id number]  MEDICAL RECORD NO.:  [id number]  LOCATION:  [facility]                         FACILITY:  [facility]  PHYSICIAN:  Gae Bon, M.D.  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  PROCEDURE:  Extraction of teeth #1, 2, 3, 4, 5, 6, 7, 8, 9, 10, 11, 12, 13, 14, 15, 16, 17, 18, 19, 20, 28, 29, 31, 32, alveoplasty, bilateral maxillary and mandible.  Removal of bilateral mandibular lingual tori.  SURGEON:  Gae Bon, MD.  ANESTHESIA:  General, oral intubation, Dr. Abner Greenspan, attending.  DESCRIPTION OF PROCEDURE:  The patient was taken to the operating room and placed on the table in supine position.  General anesthesia was administered intravenously and an oral endotracheal tube was placed and secured.  The eyes were protected.  The patient was draped for the procedure.  Time-out was performed.  The posterior pharynx was suctioned.  A throat pack was placed.  A 2% lidocaine with 1:100,000 epinephrine was infiltrated in inferior alveolar block on the right and left side and buccal and palatal infiltration of the maxilla, total of 20 mL utilized.  A bite block was placed on the right side of the mouth and a sweetheart retractor was used to retract the tongue.  A #15 blade was used to make an incision beginning at the area of tooth #17, carried forward in the gingival sulcus until tooth #20 was encountered.   The periosteum was reflected.  The teeth were elevated with 3-1 elevator. Bone was removed from around the posterior molars and the premolars with Stryker handpiece to facilitate extraction.  The teeth were elevated with 3-1 elevator and removed from the mouth with a dental forceps. Then, the area was curetted and the periosteum was reflected to expose the alveolar crest as well as the left lingual torus.  A Seldin retractor was placed to protect the lingual tissues and the torus was removed and alveoplasty was then performed using the egg-shaped bur and bone file.  Then, the area was irrigated and closed with 3-0 chromic. Then, the 15 blade was used to make an incision beginning distally at tooth #16 in the gingival sulcus both buccally and palatally and carried forward across the midline until tooth #6 was encountered.  The periosteum was reflected with a periosteal elevator.  The teeth were elevated with 301 elevator and then the teeth were removed with a dental forceps.  The sockets were curetted.  The periosteum was reflected to expose the alveolar crest and then the alveoplasty was performed using the egg-shaped bur and bone file.  Then, the  areas irrigated and closed with 3-0 chromic.  Then, the bite block and sweetheart were removed from the mouth.  An endotracheal tube was repositioned into the left side as was the bite block and sweetheart retractor.  Then, a 15 blade was used to make an incision beginning at teeth #28 and carried into the gingival sulcus buccally and lingually to tooth #32 in the mandible.  The periosteum was reflected.  The teeth were elevated with a 301 elevator. Bone was removed from around tooth #32 to allow for extraction and then the teeth were removed using the dental forceps.  The sockets were curetted and then the periosteum was reflected to expose the alveolar crest and the lingual torus.  Alveoplasty was then performed using the egg-shaped bur and  bone file and the lingual torus was removed using the egg-shaped bur and bone file as well as the Seldin retractor.  Then, the areas were irrigated and closed with 3-0 chromic in the right maxilla. An incision was made around teeth #1, 2, 6.  The periosteum was reflected.  The teeth were elevated and removed with the forceps.  The alveolar crest was exposed, and alveoplasty was performed using the egg- shaped bur and bone file.  Then, the areas irrigated and closed with 3-0 chromic.  The oral cavity was then inspected, found to have good contour, hemostasis, and closure.  The oral cavity was irrigated and suctioned.  Throat pack was removed.  The patient was awakened and taken to the recovery room, breathing spontaneously in good condition.  ESTIMATED BLOOD LOSS:  Minimal.  COMPLICATIONS:  None.  SPECIMENS:  None.     Gae Bon, M.D.     SMJ/MEDQ  D:  01/21/2016  T:  01/21/2016  Job:  [id number]

## 2016-01-24 ENCOUNTER — Encounter (HOSPITAL_COMMUNITY): Payer: Self-pay | Admitting: Oral Surgery

## 2016-02-01 ENCOUNTER — Other Ambulatory Visit (HOSPITAL_COMMUNITY): Payer: Medicaid Other

## 2016-02-04 ENCOUNTER — Encounter (HOSPITAL_COMMUNITY): Admission: RE | Payer: Self-pay | Source: Ambulatory Visit

## 2016-02-04 ENCOUNTER — Ambulatory Visit (HOSPITAL_COMMUNITY): Admission: RE | Admit: 2016-02-04 | Payer: Medicaid Other | Source: Ambulatory Visit | Admitting: Oral Surgery

## 2016-02-04 SURGERY — MULTIPLE EXTRACTION WITH ALVEOLOPLASTY
Anesthesia: General

## 2016-03-31 ENCOUNTER — Emergency Department (HOSPITAL_COMMUNITY)
Admission: EM | Admit: 2016-03-31 | Discharge: 2016-03-31 | Disposition: A | Discharge status: Home / Self Care | Payer: Medicaid Other | Attending: Emergency Medicine | Admitting: Emergency Medicine

## 2016-03-31 ENCOUNTER — Encounter (HOSPITAL_COMMUNITY): Payer: Self-pay | Admitting: *Deleted

## 2016-03-31 DIAGNOSIS — Y929 Unspecified place or not applicable: Secondary | ICD-10-CM | POA: Insufficient documentation

## 2016-03-31 DIAGNOSIS — X58XXXA Exposure to other specified factors, initial encounter: Secondary | ICD-10-CM | POA: Insufficient documentation

## 2016-03-31 DIAGNOSIS — S299XXA Unspecified injury of thorax, initial encounter: Secondary | ICD-10-CM | POA: Diagnosis present

## 2016-03-31 DIAGNOSIS — Y939 Activity, unspecified: Secondary | ICD-10-CM | POA: Diagnosis not present

## 2016-03-31 DIAGNOSIS — Z91040 Latex allergy status: Secondary | ICD-10-CM | POA: Insufficient documentation

## 2016-03-31 DIAGNOSIS — L988 Other specified disorders of the skin and subcutaneous tissue: Secondary | ICD-10-CM

## 2016-03-31 DIAGNOSIS — Y999 Unspecified external cause status: Secondary | ICD-10-CM | POA: Diagnosis not present

## 2016-03-31 DIAGNOSIS — S2001XA Contusion of right breast, initial encounter: Secondary | ICD-10-CM | POA: Diagnosis not present

## 2016-03-31 DIAGNOSIS — N649 Disorder of breast, unspecified: Secondary | ICD-10-CM

## 2016-03-31 MED ORDER — CEPHALEXIN 500 MG PO CAPS: 500.0000 mg | ORAL_CAPSULE | Freq: Four times a day (QID) | ORAL | 0 refills | Status: DC

## 2016-03-31 NOTE — ED Notes (Signed)
Pt stable, understands discharge instructions, and reasons for return.   

## 2016-03-31 NOTE — ED Provider Notes (Signed)
[facility] DEPT Provider Note   CSN: [id number] Arrival date & time: 03/31/16  1603  By signing my name below, I, Rayna Sexton, attest that this documentation has been prepared under the direction and in the presence of [employer], PA-C. Electronically Signed: Rayna Sexton, ED Scribe. 03/31/16. 5:00 PM.   History   Chief Complaint Chief Complaint  Patient presents with  . Abscess    HPI HPI Comments: [name] is a 30 y.o. female who presents to the Emergency Department complaining of worsening, moderate, point of swelling to her right breast x 3 months. She reports it began as a nontender lesion in June 2017 that developed into what looked like a "blood blister" in August 2017. Pt states she has a point of swelling that has gradually worsened and ruptured last night with resulting bloody/clear discharge but denies visualizing any purulent discharge. Pt reports associated, mild, pain across the affected region. She reports a h/o gestational diabetes but denies currently being diabetic. She has not breast fed since March of 2017. She denies fevers, chills or other associated symptoms at this time.    The history is provided by the patient and medical records. No language interpreter was used.    Past Medical History:  Diagnosis Date  . Anemia    after pregnancy  . Blood dyscrasia    takes 11 1/2 minutes to clot - as of 01/20/16 no longer seems to have this problem  . GERD (gastroesophageal reflux disease)    with pregnancy  . Gestational diabetes mellitus (GDM), antepartum   . Heart murmur    states she outgrew it  . History of IBS    in high school  . Kidney stones   . Migraine   . Uterine fibroid     Patient Active Problem List   Diagnosis Date Noted  . Esophageal reflux   . History of gestational diabetes 06/10/2015    Past Surgical History:  Procedure Laterality Date  . CESAREAN SECTION     Nov 2011  . CESAREAN SECTION WITH BILATERAL TUBAL  LIGATION Bilateral 07/26/2015   Procedure: C section ;  Surgeon: Mora Bellman, MD;  Location: [facility];  Service: Obstetrics;  Laterality: Bilateral;  . RENAL ARTERY STENT     placed and removed in 2005 - stents for kidney stones  . TOOTH EXTRACTION N/A 01/21/2016   Procedure: DENTAL RESTORATION/EXTRACTIONS;  Surgeon: Diona Browner, DDS;  Location: [facility];  Service: Oral Surgery;  Laterality: N/A;  . TUBAL LIGATION Bilateral 07/26/2015   Procedure: BILATERAL TUBAL LIGATION;  Surgeon: Mora Bellman, MD;  Location: [facility];  Service: Obstetrics;  Laterality: Bilateral;    OB History    Gravida Para Term Preterm AB Living   3 2 2   1 2    SAB TAB Ectopic Multiple Live Births   1     0 2       Home Medications    Prior to Admission medications   Medication Sig Start Date End Date Taking? Authorizing Provider  cephALEXin (KEFLEX) 500 MG capsule Take 1 capsule (500 mg total) by mouth 4 (four) times daily. 03/31/16   Katonya Blecher, PA-C  oxyCODONE-acetaminophen (PERCOCET) 5-325 MG tablet Take 1-2 tablets by mouth every 4 (four) hours as needed. 01/21/16   Diona Browner, DDS    Family History Family History  Problem Relation Age of Onset  . Hypertension Mother   . Depression Mother   . Diabetes Father   . Hypertension Father   .  Heart disease Father   . Cancer Father     foot    Social History Social History  Substance Use Topics  . Smoking status: Never Smoker  . Smokeless tobacco: Never Used  . Alcohol use 0.0 oz/week     Comment: occ     Allergies   Erythromycin; Latex; Watermelon flavor; and Ciprofloxacin   Review of Systems Review of Systems  Constitutional: Negative for chills and fever.  Musculoskeletal: Positive for myalgias.  Skin: Positive for color change and wound.  All other systems reviewed and are negative.  Physical Exam Updated Vital Signs BP 122/80 (BP Location: Right Arm)   Pulse (!) 126   Temp 97.8 F (36.6 C) (Oral)   Resp 20   LMP 03/07/2016  Comment: tubal ligation 07/2015  SpO2 100%   Physical Exam  Constitutional: She appears well-developed and well-nourished. No distress.  Awake, alert, nontoxic appearance  HENT:  Head: Normocephalic and atraumatic.  Mouth/Throat: Oropharynx is clear and moist. No oropharyngeal exudate.  Eyes: Conjunctivae are normal. No scleral icterus.  Neck: Normal range of motion. Neck supple.  Cardiovascular: Normal rate, regular rhythm and intact distal pulses.   Pulmonary/Chest: Effort normal and breath sounds normal. No respiratory distress. She has no wheezes. Right breast exhibits skin change and tenderness. Right breast exhibits no inverted nipple, no mass and no nipple discharge. Left breast exhibits no inverted nipple, no mass, no nipple discharge, no skin change and no tenderness.  Equal chest expansion. Right breast with 3 cm x 3 cm area of erythema and ecchymosis with central ulceration. No increased warmth or palpable induration. Draining serous and purulent fluid noted. Cystic breast but no discrete mass.    Abdominal: Soft. Bowel sounds are normal. She exhibits no mass. There is no tenderness. There is no rebound and no guarding.  Musculoskeletal: Normal range of motion. She exhibits no edema.  Neurological: She is alert.  Speech is clear and goal oriented Moves extremities without ataxia  Skin: Skin is warm and dry. Ecchymosis noted. She is not diaphoretic. There is erythema.  Psychiatric: She has a normal mood and affect.  Nursing note and vitals reviewed.  ED Treatments / Results  Procedures Procedures  DIAGNOSTIC STUDIES: Oxygen Saturation is 100% on RA, normal by my interpretation.    COORDINATION OF CARE: 4:55 PM Discussed next steps with pt. Pt verbalized understanding and is agreeable with the plan.    Medications Ordered in ED Medications - No data to display  EMERGENCY DEPARTMENT US SOFT TISSUE INTERPRETATION "Study: Limited Ultrasound of the noted body part in comments  below"  INDICATIONS: Pain Multiple views of the body part are obtained with a multi-frequency linear probe  PERFORMED BY:  Myself  IMAGES ARCHIVED?: Yes  SIDE:Right   BODY PART:Breast  FINDINGS: No abcess noted, Cellulitis absent and Other questionable calcified area   LIMITATIONS:  Body Habitus  INTERPRETATION:  No abcess noted and No cellulitis noted  COMMENT:  Concern for possible calcified area under the lesion, but no palpable mass on physical exam  Initial Impression / Assessment and Plan / ED Course  I have reviewed the triage vital signs and the nursing notes.  Pertinent labs & imaging results that were available during my care of the patient were reviewed by me and considered in my medical decision making (see chart for details).  Clinical Course   Patient with breast lesion that has been present for months now draining serous and purulent fluid. Clinical exam is not consistent  with cellulitis. No abscess on ultrasound. No palpable discrete or hard mass under the lesion however questionable calcified area on ultrasound. Patient is afebrile and without systemic signs of infection. Will begin on a course of Keflex and referred to the breast center. She is also to follow-up with her OB/GYN if she is unable to be seen at the breast center with an ER referral. Patient is well appearing. Discussed the great importance of a timely follow-up as the etiology of this lesion is unclear. Patient and spouse at bedside state understanding and agreed to follow-up within the next week or so.  I personally performed the services described in this documentation, which was scribed in my presence. The recorded information has been reviewed and is accurate.   Final Clinical Impressions(s) / ED Diagnoses   Final diagnoses:  Lesion of skin of breast    New Prescriptions New Prescriptions   CEPHALEXIN (KEFLEX) 500 MG CAPSULE    Take 1 capsule (500 mg total) by mouth 4 (four) times daily.      Jarrett Soho Marcas Bowsher, PA-C 03/31/16 [place], MD 04/01/16 1144

## 2016-03-31 NOTE — ED Triage Notes (Signed)
Pt states her right breast has been swollen since June. Pt reports serosanguinous drainage since last night. Pt states mass is tender and "mushy".

## 2016-03-31 NOTE — Discharge Instructions (Signed)
1. Medications: Keflex, usual home medications 2. Treatment: rest, drink plenty of fluids, complete antibiotic course 3. Follow Up: Please followup with the breast center or your OB/GYN in 3-5 days days for discussion of your diagnoses and further evaluation after today's visit; if you do not have a primary care doctor use the resource guide provided to find one; Please return to the ER for signs of worsening infection

## 2016-05-17 ENCOUNTER — Emergency Department (HOSPITAL_COMMUNITY)
Admission: EM | Admit: 2016-05-17 | Discharge: 2016-05-17 | Disposition: A | Discharge status: Home / Self Care | Payer: Medicaid Other | Attending: Emergency Medicine | Admitting: Emergency Medicine

## 2016-05-17 ENCOUNTER — Encounter (HOSPITAL_COMMUNITY): Payer: Self-pay | Admitting: *Deleted

## 2016-05-17 DIAGNOSIS — Z91040 Latex allergy status: Secondary | ICD-10-CM | POA: Diagnosis not present

## 2016-05-17 DIAGNOSIS — R51 Headache: Secondary | ICD-10-CM | POA: Diagnosis not present

## 2016-05-17 DIAGNOSIS — R519 Headache, unspecified: Secondary | ICD-10-CM

## 2016-05-17 LAB — I-STAT BETA HCG BLOOD, ED (MC, WL, AP ONLY)

## 2016-05-17 MED ORDER — SODIUM CHLORIDE 0.9 % IV BOLUS (SEPSIS)
1000.0000 mL | Freq: Once | INTRAVENOUS | Status: AC
  Administered 2016-05-17: 1000 mL via INTRAVENOUS

## 2016-05-17 MED ORDER — PROCHLORPERAZINE EDISYLATE 5 MG/ML IJ SOLN
10.0000 mg | Freq: Once | INTRAMUSCULAR | Status: AC
  Administered 2016-05-17: 10 mg via INTRAVENOUS
  Filled 2016-05-17: qty 2

## 2016-05-17 MED ORDER — DIPHENHYDRAMINE HCL 50 MG/ML IJ SOLN
25.0000 mg | Freq: Once | INTRAMUSCULAR | Status: AC
  Administered 2016-05-17: 25 mg via INTRAVENOUS
  Filled 2016-05-17: qty 1

## 2016-05-17 NOTE — ED Triage Notes (Signed)
Pt reports waking up this am with headache, n/v and hot flashes. Has hx of migraines.

## 2016-05-17 NOTE — ED Provider Notes (Signed)
[facility] DEPT Provider Note   CSN: [id number] Arrival date & time: 05/17/16  1300  By signing my name below, I, [name], attest that this documentation has been prepared under the direction and in the presence of Deno Etienne, DO . Electronically Signed: Evelene Meyers, Scribe. 05/17/2016. 5:02 PM.  History   Chief Complaint Chief Complaint  Patient presents with  . Headache   The history is provided by the patient. No language interpreter was used.  Headache   This is a recurrent problem. The current episode started 3 to 5 hours ago. The problem occurs constantly. The problem has not changed since onset.The headache is associated with bright light and loud noise. The quality of the pain is described as dull and throbbing. The pain is at a severity of 9/10. The pain is severe. The pain does not radiate. Associated symptoms include nausea. Pertinent negatives include no fever, no palpitations and no shortness of breath. She has tried nothing for the symptoms. The treatment provided no relief.     HPI Comments:  [name] is a 30 y.o. female with a history of who presents to the Emergency Department complaining of a frontal HA which began this AM. Pt states pain is exacerbated by certain movements, sound, and light. Her pain today is similar to past HAs. Pt reports associated nausea. Pt denies neck pain, fever, dizziness, cough, congestion and dysuria. No alleviating factors noted.    Past Medical History:  Diagnosis Date  . Anemia    after pregnancy  . Blood dyscrasia    takes 11 1/2 minutes to clot - as of 01/20/16 no longer seems to have this problem  . GERD (gastroesophageal reflux disease)    with pregnancy  . Gestational diabetes mellitus (GDM), antepartum   . Heart murmur    states she outgrew it  . History of IBS    in high school  . Kidney stones   . Migraine   . Uterine fibroid     Patient Active Problem List   Diagnosis Date Noted  . Esophageal  reflux   . History of gestational diabetes 06/10/2015    Past Surgical History:  Procedure Laterality Date  . CESAREAN SECTION     Nov 2011  . CESAREAN SECTION WITH BILATERAL TUBAL LIGATION Bilateral 07/26/2015   Procedure: C section ;  Surgeon: Mora Bellman, MD;  Location: [facility];  Service: Obstetrics;  Laterality: Bilateral;  . RENAL ARTERY STENT     placed and removed in 2005 - stents for kidney stones  . TOOTH EXTRACTION N/A 01/21/2016   Procedure: DENTAL RESTORATION/EXTRACTIONS;  Surgeon: Diona Browner, DDS;  Location: [facility];  Service: Oral Surgery;  Laterality: N/A;  . TUBAL LIGATION Bilateral 07/26/2015   Procedure: BILATERAL TUBAL LIGATION;  Surgeon: Mora Bellman, MD;  Location: [facility];  Service: Obstetrics;  Laterality: Bilateral;    OB History    Gravida Para Term Preterm AB Living   3 2 2   1 2    SAB TAB Ectopic Multiple Live Births   1     0 2       Home Medications    Prior to Admission medications   Medication Sig Start Date End Date Taking? Authorizing Provider  cephALEXin (KEFLEX) 500 MG capsule Take 1 capsule (500 mg total) by mouth 4 (four) times daily. 03/31/16   Hannah Muthersbaugh, PA-C  oxyCODONE-acetaminophen (PERCOCET) 5-325 MG tablet Take 1-2 tablets by mouth every 4 (four) hours as needed. 01/21/16  Diona Browner, DDS    Family History Family History  Problem Relation Age of Onset  . Hypertension Mother   . Depression Mother   . Diabetes Father   . Hypertension Father   . Heart disease Father   . Cancer Father     foot    Social History Social History  Substance Use Topics  . Smoking status: Never Smoker  . Smokeless tobacco: Never Used  . Alcohol use 0.0 oz/week     Comment: occ     Allergies   Erythromycin; Latex; Watermelon flavor; and Ciprofloxacin   Review of Systems Review of Systems  Constitutional: Negative for chills and fever.  HENT: Negative for congestion and rhinorrhea.   Eyes: Negative for redness and visual  disturbance.  Respiratory: Negative for shortness of breath and wheezing.   Cardiovascular: Negative for chest pain and palpitations.  Gastrointestinal: Positive for nausea.  Genitourinary: Negative for dysuria and urgency.  Musculoskeletal: Negative for arthralgias, myalgias and neck pain.  Skin: Negative for pallor and wound.  Neurological: Positive for headaches. Negative for dizziness, syncope, weakness and numbness.     Physical Exam Updated Vital Signs BP 120/70 (BP Location: Right Arm)   Pulse 99   Temp 97.9 F (36.6 C) (Oral)   Resp 16   Ht 5' (1.524 m)   Wt 199 lb (90.3 kg)   LMP 05/05/2016   SpO2 100%   BMI 38.86 kg/m   Physical Exam  Constitutional: She is oriented to person, place, and time. She appears well-developed and well-nourished. No distress.  HENT:  Head: Normocephalic and atraumatic.  Eyes: EOM are normal. Pupils are equal, round, and reactive to light.  Neck: Normal range of motion. Neck supple.  Cardiovascular: Normal rate and regular rhythm.  Exam reveals no gallop and no friction rub.   No murmur heard. Pulmonary/Chest: Effort normal. She has no wheezes. She has no rales.  Abdominal: Soft. She exhibits no distension. There is no tenderness.  Musculoskeletal: She exhibits no edema or tenderness.  Neurological: She is alert and oriented to person, place, and time. She displays normal reflexes. No cranial nerve deficit or sensory deficit. She exhibits normal muscle tone. Coordination and gait normal. GCS eye subscore is 4. GCS verbal subscore is 5. GCS motor subscore is 6. She displays no Babinski's sign on the right side. She displays no Babinski's sign on the left side.  Skin: Skin is warm and dry. She is not diaphoretic.  Psychiatric: She has a normal mood and affect. Her behavior is normal.  Nursing note and vitals reviewed.    ED Treatments / Results  DIAGNOSTIC STUDIES:  Oxygen Saturation is 100% on RA, normal by my interpretation.     COORDINATION OF CARE:  5:00 PM Discussed treatment plan with pt at bedside and pt agreed to plan.  Labs (all labs ordered are listed, but only abnormal results are displayed) Labs Reviewed  I-STAT BETA HCG BLOOD, ED ([facility], [facility], [facility] ONLY)    EKG  EKG Interpretation None       Radiology No results found.  Procedures Procedures (including critical care time)  Medications Ordered in ED Medications  prochlorperazine (COMPAZINE) injection 10 mg (10 mg Intravenous Given 05/17/16 1800)  diphenhydrAMINE (BENADRYL) injection 25 mg (25 mg Intravenous Given 05/17/16 1800)  sodium chloride 0.9 % bolus 1,000 mL (0 mLs Intravenous Stopped 05/17/16 1900)     Initial Impression / Assessment and Plan / ED Course  I have reviewed the triage vital signs and  the nursing notes.  Pertinent labs & imaging results that were available during my care of the patient were reviewed by me and considered in my medical decision making (see chart for details).  Clinical Course     30 yo F With a chief complaint of a headache. Patient has a history of same. Feels the same to her as previous. Patient denies any fevers or chills denies neck pain. Slow in onset. Normal neuro exam. Pain completely relieved with migraine cocktail. Discharge home.  8:24 PM:  I have discussed the diagnosis/risks/treatment options with the patient and family and believe the pt to be eligible for discharge home to follow-up with PCP. We also discussed returning to the ED immediately if new or worsening sx occur. We discussed the sx which are most concerning (e.g., sudden worsening pain, fever, inability to tolerate by mouth) that necessitate immediate return. Medications administered to the patient during their visit and any new prescriptions provided to the patient are listed below.  Medications given during this visit Medications  prochlorperazine (COMPAZINE) injection 10 mg (10 mg Intravenous Given 05/17/16 1800)   diphenhydrAMINE (BENADRYL) injection 25 mg (25 mg Intravenous Given 05/17/16 1800)  sodium chloride 0.9 % bolus 1,000 mL (0 mLs Intravenous Stopped 05/17/16 1900)     The patient appears reasonably screen and/or stabilized for discharge and I doubt any other medical condition or other [facility] requiring further screening, evaluation, or treatment in the ED at this time prior to discharge.    Final Clinical Impressions(s) / ED Diagnoses   Final diagnoses:  Nonintractable headache, unspecified chronicity pattern, unspecified headache type    New Prescriptions Discharge Medication List as of 05/17/2016  6:51 PM      I personally performed the services described in this documentation, which was scribed in my presence. The recorded information has been reviewed and is accurate.     Deno Etienne, DO 05/17/16 2024

## 2016-07-31 ENCOUNTER — Emergency Department (HOSPITAL_COMMUNITY)
Admission: EM | Admit: 2016-07-31 | Discharge: 2016-07-31 | Disposition: A | Discharge status: Home / Self Care | Payer: Medicaid Other | Attending: Emergency Medicine | Admitting: Emergency Medicine

## 2016-07-31 ENCOUNTER — Encounter (HOSPITAL_COMMUNITY): Payer: Self-pay | Admitting: Emergency Medicine

## 2016-07-31 DIAGNOSIS — J019 Acute sinusitis, unspecified: Secondary | ICD-10-CM | POA: Diagnosis not present

## 2016-07-31 DIAGNOSIS — Z91040 Latex allergy status: Secondary | ICD-10-CM | POA: Diagnosis not present

## 2016-07-31 DIAGNOSIS — G43909 Migraine, unspecified, not intractable, without status migrainosus: Secondary | ICD-10-CM | POA: Diagnosis present

## 2016-07-31 MED ORDER — KETOROLAC TROMETHAMINE 30 MG/ML IJ SOLN
30.0000 mg | Freq: Once | INTRAMUSCULAR | Status: AC
  Administered 2016-07-31: 30 mg via INTRAMUSCULAR
  Filled 2016-07-31: qty 1

## 2016-07-31 MED ORDER — METOCLOPRAMIDE HCL 10 MG PO TABS
10.0000 mg | ORAL_TABLET | Freq: Once | ORAL | Status: AC
  Administered 2016-07-31: 10 mg via ORAL
  Filled 2016-07-31: qty 1

## 2016-07-31 MED ORDER — AMOXICILLIN-POT CLAVULANATE 875-125 MG PO TABS: 1.0000 | ORAL_TABLET | Freq: Two times a day (BID) | ORAL | 0 refills | Status: DC

## 2016-07-31 MED ORDER — DIPHENHYDRAMINE HCL 25 MG PO CAPS
25.0000 mg | ORAL_CAPSULE | Freq: Once | ORAL | Status: AC
  Administered 2016-07-31: 25 mg via ORAL
  Filled 2016-07-31: qty 1

## 2016-07-31 NOTE — ED Triage Notes (Signed)
Patient c/o migraine for 3-4 days that has been constant. patient has been sick on her stomach. Patient denies any visual disturbances. 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  Patient reports the posterior part of head feels numb.

## 2016-07-31 NOTE — ED Notes (Signed)
Patient left before discharge instructions were reviewed and given. Pt left gown on the chair in the room. When seen last, patient was alert, oriented x 4, and appeared in acute distress. Informed provider that patient didn't receive discharge instructions.

## 2016-07-31 NOTE — ED Provider Notes (Signed)
[facility] DEPT Provider Note   CSN: [id number] Arrival date & time: 07/31/16  [id number]   History   Chief Complaint Chief Complaint  Patient presents with  . Migraine    HPI [name] is a 31 y.o. female.  HPI   31 year old female presents today with complaints of migraine. Patient reports a significant past medical history of the same. Patient notes that this seems identical to previous, she denies any neurological deficits, fever, neck stiffness, or any other red flags for headache. Patient reports taking Excedrin sinus medication which only minorly improved symptoms. Patient notes that for the last week she has had on and off headache located over the left frontal sinus, she reports dry nonproductive cough and sinus pressure. Patient has no other complaints.  Past Medical History:  Diagnosis Date  . Anemia    after pregnancy  . Blood dyscrasia    takes 11 1/2 minutes to clot - as of 01/20/16 no longer seems to have this problem  . GERD (gastroesophageal reflux disease)    with pregnancy  . Gestational diabetes mellitus (GDM), antepartum   . Heart murmur    states she outgrew it  . History of IBS    in high school  . Kidney stones   . Migraine   . Uterine fibroid     Patient Active Problem List   Diagnosis Date Noted  . Esophageal reflux   . History of gestational diabetes 06/10/2015    Past Surgical History:  Procedure Laterality Date  . CESAREAN SECTION     Nov 2011  . CESAREAN SECTION WITH BILATERAL TUBAL LIGATION Bilateral 07/26/2015   Procedure: C section ;  Surgeon: Mora Bellman, MD;  Location: [facility];  Service: Obstetrics;  Laterality: Bilateral;  . RENAL ARTERY STENT     placed and removed in 2005 - stents for kidney stones  . TOOTH EXTRACTION N/A 01/21/2016   Procedure: DENTAL RESTORATION/EXTRACTIONS;  Surgeon: Diona Browner, DDS;  Location: [facility];  Service: Oral Surgery;  Laterality: N/A;  . TUBAL LIGATION Bilateral 07/26/2015   Procedure: BILATERAL  TUBAL LIGATION;  Surgeon: Mora Bellman, MD;  Location: [facility];  Service: Obstetrics;  Laterality: Bilateral;    OB History    Gravida Para Term Preterm AB Living   3 2 2   1 2    SAB TAB Ectopic Multiple Live Births   1     0 2       Home Medications    Prior to Admission medications   Medication Sig Start Date End Date Taking? Authorizing Provider  ibuprofen (ADVIL,MOTRIN) 200 MG tablet Take 800 mg by mouth every 6 (six) hours as needed for headache, mild pain or moderate pain.   Yes Historical Provider, MD  phenylephrine (SUDAFED PE) 10 MG TABS tablet Take 20 mg by mouth every 4 (four) hours as needed (for congestion).   Yes Historical Provider, MD  amoxicillin-clavulanate (AUGMENTIN) 875-125 MG tablet Take 1 tablet by mouth every 12 (twelve) hours. 07/31/16   Okey Regal, PA-C    Family History Family History  Problem Relation Age of Onset  . Hypertension Mother   . Depression Mother   . Diabetes Father   . Hypertension Father   . Heart disease Father   . Cancer Father     foot    Social History Social History  Substance Use Topics  . Smoking status: Never Smoker  . Smokeless tobacco: Never Used  . Alcohol use 0.0 oz/week  Comment: occ     Allergies   Erythromycin; Latex; Food; and Ciprofloxacin   Review of Systems Review of Systems  All other systems reviewed and are negative.    Physical Exam Updated Vital Signs BP 122/88 (BP Location: Left Arm)   Pulse 70   Temp 97.8 F (36.6 C) (Oral)   Resp 18   Ht 5' 0.5" (1.537 m)   Wt 89.8 kg   LMP 07/23/2016   SpO2 100%   BMI 38.03 kg/m   Physical Exam  Constitutional: She is oriented to person, place, and time. She appears well-developed and well-nourished.  HENT:  Head: Normocephalic and atraumatic.  Eyes: Conjunctivae are normal. Pupils are equal, round, and reactive to light. Right eye exhibits no discharge. Left eye exhibits no discharge. No scleral icterus.  Neck: Normal range of motion.  No JVD present. No tracheal deviation present.  Pulmonary/Chest: Effort normal. No stridor.  Neurological: She is alert and oriented to person, place, and time. She has normal strength. No cranial nerve deficit or sensory deficit. Coordination normal. GCS eye subscore is 4. GCS verbal subscore is 5. GCS motor subscore is 6.  Psychiatric: She has a normal mood and affect. Her behavior is normal. Judgment and thought content normal.  Nursing note and vitals reviewed.    ED Treatments / Results  Labs (all labs ordered are listed, but only abnormal results are displayed) Labs Reviewed - No data to display  EKG  EKG Interpretation None       Radiology No results found.  Procedures Procedures (including critical care time)  Medications Ordered in ED Medications  ketorolac (TORADOL) 30 MG/ML injection 30 mg (30 mg Intramuscular Given 07/31/16 1642)  metoCLOPramide (REGLAN) tablet 10 mg (10 mg Oral Given 07/31/16 1642)  diphenhydrAMINE (BENADRYL) capsule 25 mg (25 mg Oral Given 07/31/16 1641)     Initial Impression / Assessment and Plan / ED Course  I have reviewed the triage vital signs and the nursing notes.  Pertinent labs & imaging results that were available during my care of the patient were reviewed by me and considered in my medical decision making (see chart for details).       Final Clinical Impressions(s) / ED Diagnoses   Final diagnoses:  Acute non-recurrent sinusitis, unspecified location   Labs:   Imaging:  Consults:  Therapeutics:Reglan, Benadryl, Toradol  Discharge Meds: Augmentin  Assessment/Plan:  31 year old female presents today with migraine. Patient having sinus pressure over the last week, this is unilateral, not improving. She has no signs of significant infectious etiology. With persistence of symptoms exacerbating her migraine she'll be treated for bacterial sinusitis. She will receive Augmentin, encouraged to use Tylenol or ibuprofen as needed  for headache, focal to primary care for reevaluation. Patient was given strict cautions, she verbalized understanding and agreement to today's plan had no further questions or concerns.  4:48 PM she reassessed, she still has not received her medication after 2 hours. Patient requesting medication and immediate discharge. Patient will receive medication and be discharged home, no acute concerns.        New Prescriptions New Prescriptions   AMOXICILLIN-CLAVULANATE (AUGMENTIN) 875-125 MG TABLET    Take 1 tablet by mouth every 12 (twelve) hours.     Okey Regal, PA-C 07/31/16 [place], MD 08/01/16 1450

## 2016-07-31 NOTE — Discharge Instructions (Signed)
Please read attached information. If you experience any new or worsening signs or symptoms please return to the emergency room for evaluation. Please follow-up with your primary care provider or specialist as discussed. Please use medication prescribed only as directed and discontinue taking if you have any concerning signs or symptoms.   °

## 2016-08-28 ENCOUNTER — Emergency Department (HOSPITAL_COMMUNITY)
Admission: EM | Admit: 2016-08-28 | Discharge: 2016-08-28 | Disposition: A | Discharge status: Home / Self Care | Payer: Medicaid Other | Attending: Emergency Medicine | Admitting: Emergency Medicine

## 2016-08-28 ENCOUNTER — Encounter (HOSPITAL_COMMUNITY): Payer: Self-pay | Admitting: Emergency Medicine

## 2016-08-28 DIAGNOSIS — R51 Headache: Secondary | ICD-10-CM | POA: Diagnosis present

## 2016-08-28 DIAGNOSIS — G43809 Other migraine, not intractable, without status migrainosus: Secondary | ICD-10-CM | POA: Insufficient documentation

## 2016-08-28 MED ORDER — DIPHENHYDRAMINE HCL 25 MG PO CAPS
50.0000 mg | ORAL_CAPSULE | Freq: Once | ORAL | Status: AC
  Administered 2016-08-28: 50 mg via ORAL
  Filled 2016-08-28: qty 2

## 2016-08-28 MED ORDER — KETOROLAC TROMETHAMINE 30 MG/ML IJ SOLN
30.0000 mg | Freq: Once | INTRAMUSCULAR | Status: AC
  Administered 2016-08-28: 30 mg via INTRAMUSCULAR
  Filled 2016-08-28: qty 1

## 2016-08-28 MED ORDER — METOCLOPRAMIDE HCL 5 MG/ML IJ SOLN
10.0000 mg | Freq: Once | INTRAMUSCULAR | Status: AC
  Administered 2016-08-28: 10 mg via INTRAMUSCULAR
  Filled 2016-08-28: qty 2

## 2016-08-28 NOTE — Discharge Instructions (Signed)
You have been seen in the Emergency Department (ED) for a migraine.  Please use Tylenol or Motrin as needed for symptoms, but only as written on the box, and take any regular medications that have been prescribed for you. °As we have discussed, please follow up with your doctor as soon as possible regarding today’s ED visit and your headache symptoms.   ° °Call your doctor or return to the Emergency Department (ED) if you have a worsening headache, sudden and severe headache, confusion, slurred speech, facial droop, weakness or numbness in any arm or leg, extreme fatigue, or other symptoms that concern you. ° ° °Migraine Headache °A migraine headache is an intense, throbbing pain on one or both sides of your head. A migraine can last for 30 minutes to several hours. °CAUSES  °The exact cause of a migraine headache is not always known. However, a migraine may be caused when nerves in the brain become irritated and release chemicals that cause inflammation. This causes pain. °Certain things may also trigger migraines, such as: °· Alcohol. °· Smoking. °· Stress. °· Menstruation. °· Aged cheeses. °· Foods or drinks that contain nitrates, glutamate, aspartame, or tyramine. °· Lack of sleep. °· Chocolate. °· Caffeine. °· Hunger. °· Physical exertion. °· Fatigue. °· Medicines used to treat chest pain (nitroglycerine), birth control pills, estrogen, and some blood pressure medicines. °SIGNS AND SYMPTOMS °· Pain on one or both sides of your head. °· Pulsating or throbbing pain. °· Severe pain that prevents daily activities. °· Pain that is aggravated by any physical activity. °· Nausea, vomiting, or both. °· Dizziness. °· Pain with exposure to bright lights, loud noises, or activity. °· General sensitivity to bright lights, loud noises, or smells. °Before you get a migraine, you may get warning signs that a migraine is coming (aura). An aura may include: °· Seeing flashing lights. °· Seeing bright spots, halos, or zigzag  lines. °· Having tunnel vision or blurred vision. °· Having feelings of numbness or tingling. °· Having trouble talking. °· Having muscle weakness. °DIAGNOSIS  °A migraine headache is often diagnosed based on: °· Symptoms. °· Physical exam. °· A CT scan or MRI of your head. These imaging tests cannot diagnose migraines, but they can help rule out other causes of headaches. °TREATMENT °Medicines may be given for pain and nausea. Medicines can also be given to help prevent recurrent migraines.  °HOME CARE INSTRUCTIONS °· Only take over-the-counter or prescription medicines for pain or discomfort as directed by your health care provider. The use of Santosha Jividen-term narcotics is not recommended. °· Lie down in a dark, quiet room when you have a migraine. °· Keep a journal to find out what may trigger your migraine headaches. For example, write down: °¨ What you eat and drink. °¨ How much sleep you get. °¨ Any change to your diet or medicines. °· Limit alcohol consumption. °· Quit smoking if you smoke. °· Get 7-9 hours of sleep, or as recommended by your health care provider. °· Limit stress. °· Keep lights dim if bright lights bother you and make your migraines worse. °SEEK IMMEDIATE MEDICAL CARE IF:  °· Your migraine becomes severe. °· You have a fever. °· You have a stiff neck. °· You have vision loss. °· You have muscular weakness or loss of muscle control. °· You start losing your balance or have trouble walking. °· You feel faint or pass out. °· You have severe symptoms that are different from your first symptoms. °MAKE SURE YOU:  °·   Understand these instructions. °· Will watch your condition. °· Will get help right away if you are not doing well or get worse. °  °This information is not intended to replace advice given to you by your health care provider. Make sure you discuss any questions you have with your health care provider. °  °Document Released: 06/05/2005 Document Revised: 06/26/2014 Document Reviewed:  02/10/2013 °Elsevier Interactive Patient Education ©2016 [employer] ° ° ° °

## 2016-08-28 NOTE — ED Notes (Signed)
Papers reviewed with patient and she reports decrease in pain and ready to go home

## 2016-08-28 NOTE — ED Provider Notes (Signed)
Emergency Department Provider Note   I have reviewed the triage vital signs and the nursing notes.   HISTORY  Chief Complaint Migraine   HPI Brizeyda A Nuon is a 31 y.o. female with PMH of migraine HA, GERD, and kidney stones presents to the emergency department for evaluation of headache. She states the headache is very similar to her migraine headaches of the past. She describes it as left sided and throbbing in quality. The pain is severe. She took some sinus medication yesterday evening when the headache started and had mild relief in symptoms. This morning she noted that the pain and return if she had associated photophobia with nausea and photophobia. No vision changes. No numbness or weakness. No gait instability. No radiation of pain.   Past Medical History:  Diagnosis Date  . Anemia    after pregnancy  . Blood dyscrasia    takes 11 1/2 minutes to clot - as of 01/20/16 no longer seems to have this problem  . GERD (gastroesophageal reflux disease)    with pregnancy  . Gestational diabetes mellitus (GDM), antepartum   . Heart murmur    states she outgrew it  . History of IBS    in high school  . Kidney stones   . Migraine   . Uterine fibroid     Patient Active Problem List   Diagnosis Date Noted  . Esophageal reflux   . History of gestational diabetes 06/10/2015    Past Surgical History:  Procedure Laterality Date  . CESAREAN SECTION     Nov 2011  . CESAREAN SECTION WITH BILATERAL TUBAL LIGATION Bilateral 07/26/2015   Procedure: C section ;  Surgeon: Mora Bellman, MD;  Location: [facility];  Service: Obstetrics;  Laterality: Bilateral;  . RENAL ARTERY STENT     placed and removed in 2005 - stents for kidney stones  . TOOTH EXTRACTION N/A 01/21/2016   Procedure: DENTAL RESTORATION/EXTRACTIONS;  Surgeon: Diona Browner, DDS;  Location: [facility];  Service: Oral Surgery;  Laterality: N/A;  . TUBAL LIGATION Bilateral 07/26/2015   Procedure: BILATERAL TUBAL LIGATION;  Surgeon:  Mora Bellman, MD;  Location: [facility];  Service: Obstetrics;  Laterality: Bilateral;    Current Outpatient Rx  . Order #: [id number] Class: Print  . Order #: [id number] Class: Historical Med  . Order #: [id number] Class: Historical Med    Allergies Erythromycin; Latex; Food; and Ciprofloxacin  Family History  Problem Relation Age of Onset  . Hypertension Mother   . Depression Mother   . Diabetes Father   . Hypertension Father   . Heart disease Father   . Cancer Father     foot    Social History Social History  Substance Use Topics  . Smoking status: Never Smoker  . Smokeless tobacco: Never Used  . Alcohol use 0.0 oz/week     Comment: occ    Review of Systems  Constitutional: No fever/chills Eyes: No visual changes. ENT: No sore throat. Cardiovascular: Denies chest pain. Respiratory: Denies shortness of breath. Gastrointestinal: No abdominal pain.  No nausea, no vomiting.  No diarrhea.  No constipation. Genitourinary: Negative for dysuria. Musculoskeletal: Negative for back pain. Skin: Negative for rash. Neurological: Negative for focal weakness or numbness. Positive HA, photophobia, and phonophobia.   10-point ROS otherwise negative.  ____________________________________________   PHYSICAL EXAM:  VITAL SIGNS: ED Triage Vitals  Enc Vitals Group     BP 08/28/16 0859 130/78     Pulse Rate 08/28/16 0859 73     Resp  08/28/16 0859 18     Temp 08/28/16 0859 97.7 F (36.5 C)     Temp Source 08/28/16 0859 Oral     SpO2 08/28/16 0859 100 %     Weight 08/28/16 0859 204 lb (92.5 kg)     Height 08/28/16 0859 5\' 1"  (1.549 m)     Pain Score 08/28/16 0903 8   Constitutional: Alert and oriented. Well appearing and in no acute distress. Sitting in dark room.  Eyes: Conjunctivae are normal. Positive photophobia. EOMI.  Head: Atraumatic. Nose: No congestion/rhinnorhea. Mouth/Throat: Mucous membranes are moist.   Neck: No stridor.   Cardiovascular: Normal rate, regular  rhythm. Good peripheral circulation. Grossly normal heart sounds.   Respiratory: Normal respiratory effort.  No retractions. Lungs CTAB. Gastrointestinal: Soft and nontender. No distention.  Musculoskeletal: No lower extremity tenderness nor edema. No gross deformities of extremities. Neurologic:  Normal speech and language. No gross focal neurologic deficits are appreciated. Normal CN exam.  Skin:  Skin is warm, dry and intact. No rash noted. Psychiatric: Mood and affect are normal. Speech and behavior are normal.  ____________________________________________  RADIOLOGY  None ____________________________________________   PROCEDURES  Procedure(s) performed:   Procedures  None ____________________________________________   INITIAL IMPRESSION / ASSESSMENT AND PLAN / ED COURSE  Pertinent labs & imaging results that were available during my care of the patient were reviewed by me and considered in my medical decision making (see chart for details).  Patient resents to the emergency department for evaluation of migraine headache symptoms. Patient has a history of migraines and states this feels very similar to her typical migraine headache. She was unable to manage at home with over-the-counter medications. Patient has no focal neurological deficits. Her fever or meningismus to suggest infectious etiology. Patient does not have a neurologist or primary care physician. Plan for migraine cocktail medications and reassessment.  Differential diagnosis includes but is not exclusive to subarachnoid hemorrhage, meningitis, encephalitis, previous head trauma, cavernous venous thrombosis, muscle tension headache, glaucoma, temporal arteritis, migraine or migraine equivalent, etc.  11:00 AM Patient reports near resolution of HA symptoms and would like to be discharged. Provided information regarding establishing PCP in the area along with detailed return precautions.   At this time, I do not  feel there is any life-threatening condition present. I have reviewed and discussed all results (EKG, imaging, lab, urine as appropriate), exam findings with patient. I have reviewed nursing notes and appropriate previous records.  I feel the patient is safe to be discharged home without further emergent workup. Discussed usual and customary return precautions. Patient and family (if present) verbalize understanding and are comfortable with this plan.  Patient will follow-up with their primary care provider. If they do not have a primary care provider, information for follow-up has been provided to them. All questions have been answered.  ____________________________________________  FINAL CLINICAL IMPRESSION(S) / ED DIAGNOSES  Final diagnoses:  Other migraine without status migrainosus, not intractable     MEDICATIONS GIVEN DURING THIS VISIT:  Medications  ketorolac (TORADOL) 30 MG/ML injection 30 mg (30 mg Intramuscular Given 08/28/16 1021)  metoCLOPramide (REGLAN) injection 10 mg (10 mg Intramuscular Given 08/28/16 1020)  diphenhydrAMINE (BENADRYL) capsule 50 mg (50 mg Oral Given 08/28/16 1019)     NEW OUTPATIENT MEDICATIONS STARTED DURING THIS VISIT:  None   Note:  This document was prepared using Dragon voice recognition software and may include unintentional dictation errors.  Nanda Quinton, MD Emergency Medicine   Margette Fast, MD 08/28/16 1101

## 2016-08-28 NOTE — ED Triage Notes (Signed)
Pt reports hx of migraines, states she started getting one yesterday, took some otc meds that helped some but headache returned last night. Denies any vision issues at this time. Pt a/ox4, resp e/u, nad.

## 2016-09-14 ENCOUNTER — Ambulatory Visit: Payer: Medicaid Other | Admitting: Family Medicine

## 2016-10-24 ENCOUNTER — Ambulatory Visit: Payer: Medicaid Other | Admitting: Family Medicine

## 2016-10-24 NOTE — Progress Notes (Deleted)
Last pap 11/2014 - Normal

## 2016-10-25 DIAGNOSIS — Z79899 Other long term (current) drug therapy: Secondary | ICD-10-CM | POA: Diagnosis not present

## 2016-11-07 ENCOUNTER — Encounter (HOSPITAL_COMMUNITY): Payer: Self-pay

## 2016-11-07 ENCOUNTER — Emergency Department (HOSPITAL_COMMUNITY)
Admission: EM | Admit: 2016-11-07 | Discharge: 2016-11-07 | Disposition: A | Discharge status: Home / Self Care | Payer: Medicaid Other | Attending: Emergency Medicine | Admitting: Emergency Medicine

## 2016-11-07 DIAGNOSIS — Z79899 Other long term (current) drug therapy: Secondary | ICD-10-CM | POA: Insufficient documentation

## 2016-11-07 DIAGNOSIS — N760 Acute vaginitis: Secondary | ICD-10-CM | POA: Insufficient documentation

## 2016-11-07 DIAGNOSIS — B9689 Other specified bacterial agents as the cause of diseases classified elsewhere: Secondary | ICD-10-CM

## 2016-11-07 DIAGNOSIS — R300 Dysuria: Secondary | ICD-10-CM | POA: Diagnosis present

## 2016-11-07 DIAGNOSIS — N72 Inflammatory disease of cervix uteri: Secondary | ICD-10-CM | POA: Diagnosis not present

## 2016-11-07 DIAGNOSIS — Z91040 Latex allergy status: Secondary | ICD-10-CM | POA: Diagnosis not present

## 2016-11-07 LAB — PREGNANCY, URINE: PREG TEST UR: NEGATIVE

## 2016-11-07 LAB — URINALYSIS, ROUTINE W REFLEX MICROSCOPIC
BILIRUBIN URINE: NEGATIVE
Glucose, UA: NEGATIVE mg/dL
Hgb urine dipstick: NEGATIVE
Ketones, ur: 20 mg/dL — AB
Nitrite: NEGATIVE
PH: 7 (ref 5.0–8.0)
Protein, ur: 30 mg/dL — AB
SPECIFIC GRAVITY, URINE: 1.025 (ref 1.005–1.030)

## 2016-11-07 LAB — WET PREP, GENITAL
SPERM: NONE SEEN
TRICH WET PREP: NONE SEEN
YEAST WET PREP: NONE SEEN

## 2016-11-07 MED ORDER — METRONIDAZOLE 500 MG PO TABS: 500.0000 mg | ORAL_TABLET | Freq: Two times a day (BID) | ORAL | 0 refills | Status: DC

## 2016-11-07 MED ORDER — DOXYCYCLINE HYCLATE 100 MG PO CAPS: 100.0000 mg | ORAL_CAPSULE | Freq: Two times a day (BID) | ORAL | 0 refills | Status: DC

## 2016-11-07 MED ORDER — DOXYCYCLINE HYCLATE 100 MG PO TABS
100.0000 mg | ORAL_TABLET | Freq: Once | ORAL | Status: AC
  Administered 2016-11-07: 100 mg via ORAL
  Filled 2016-11-07: qty 1

## 2016-11-07 MED ORDER — CEFTRIAXONE SODIUM 250 MG IJ SOLR
250.0000 mg | Freq: Once | INTRAMUSCULAR | Status: AC
  Administered 2016-11-07: 250 mg via INTRAMUSCULAR
  Filled 2016-11-07: qty 250

## 2016-11-07 MED ORDER — PROBIOTIC 250 MG PO CAPS: 1.0000 | ORAL_CAPSULE | Freq: Every day | ORAL | 0 refills | Status: DC

## 2016-11-07 NOTE — ED Provider Notes (Signed)
[facility] DEPT Provider Note   CSN: [id number] Arrival date & time: 11/07/16  1836     History   Chief Complaint Chief Complaint  Patient presents with  . Abdominal Pain    HPI [name] is a 31 y.o. female.  Pt presents to the ED today with suprapubic pain and dysuria.  The pt said that she has had sx for the past 2 weeks.  The pt does admit to "swinging" without protection.  She is concerned it is a STD.      Past Medical History:  Diagnosis Date  . Anemia    after pregnancy  . Blood dyscrasia    takes 11 1/2 minutes to clot - as of 01/20/16 no longer seems to have this problem  . GERD (gastroesophageal reflux disease)    with pregnancy  . Gestational diabetes mellitus (GDM), antepartum   . Heart murmur    states she outgrew it  . History of IBS    in high school  . Kidney stones   . Migraine   . Uterine fibroid     Patient Active Problem List   Diagnosis Date Noted  . Esophageal reflux   . History of gestational diabetes 06/10/2015    Past Surgical History:  Procedure Laterality Date  . CESAREAN SECTION     Nov 2011  . CESAREAN SECTION WITH BILATERAL TUBAL LIGATION Bilateral 07/26/2015   Procedure: C section ;  Surgeon: Mora Bellman, MD;  Location: [facility];  Service: Obstetrics;  Laterality: Bilateral;  . RENAL ARTERY STENT     placed and removed in 2005 - stents for kidney stones  . TOOTH EXTRACTION N/A 01/21/2016   Procedure: DENTAL RESTORATION/EXTRACTIONS;  Surgeon: Diona Browner, DDS;  Location: [facility];  Service: Oral Surgery;  Laterality: N/A;  . TUBAL LIGATION Bilateral 07/26/2015   Procedure: BILATERAL TUBAL LIGATION;  Surgeon: Mora Bellman, MD;  Location: [facility];  Service: Obstetrics;  Laterality: Bilateral;    OB History    Gravida Para Term Preterm AB Living   3 2 2   1 2    SAB TAB Ectopic Multiple Live Births   1     0 2       Home Medications    Prior to Admission medications   Medication Sig Start Date End Date Taking?  Authorizing Provider  amoxicillin-clavulanate (AUGMENTIN) 875-125 MG tablet Take 1 tablet by mouth every 12 (twelve) hours. 07/31/16   Hedges, Dellis Filbert, PA-C  doxycycline (VIBRAMYCIN) 100 MG capsule Take 1 capsule (100 mg total) by mouth 2 (two) times daily. 11/07/16   Isla Pence, MD  ibuprofen (ADVIL,MOTRIN) 200 MG tablet Take 800 mg by mouth every 6 (six) hours as needed for headache, mild pain or moderate pain.    [provider]  metroNIDAZOLE (FLAGYL) 500 MG tablet Take 1 tablet (500 mg total) by mouth 2 (two) times daily. 11/07/16   Isla Pence, MD  phenylephrine (SUDAFED PE) 10 MG TABS tablet Take 20 mg by mouth every 4 (four) hours as needed (for congestion).    [provider]  Saccharomyces boulardii (PROBIOTIC) 250 MG CAPS Take 1 capsule by mouth daily. 11/07/16   Isla Pence, MD    Family History Family History  Problem Relation Age of Onset  . Hypertension Mother   . Depression Mother   . Diabetes Father   . Hypertension Father   . Heart disease Father   . Cancer Father        foot  Social History Social History  Substance Use Topics  . Smoking status: Never Smoker  . Smokeless tobacco: Never Used  . Alcohol use 0.0 oz/week     Comment: occ     Allergies   Erythromycin; Latex; Food; and Ciprofloxacin   Review of Systems Review of Systems  Genitourinary: Positive for dysuria.  All other systems reviewed and are negative.    Physical Exam Updated Vital Signs BP 106/78 (BP Location: Left Arm)   Pulse 81   Temp 98.8 F (37.1 C) (Axillary)   Resp 18   Ht 5\' 1"  (1.549 m)   Wt 93.9 kg (207 lb)   LMP 10/18/2016 (Approximate)   SpO2 100%   BMI 39.11 kg/m   Physical Exam  Constitutional: She is oriented to person, place, and time. She appears well-developed and well-nourished.  HENT:  Head: Normocephalic and atraumatic.  Right Ear: External ear normal.  Left Ear: External ear normal.  Nose: Nose normal.  Mouth/Throat:  Oropharynx is clear and moist.  Eyes: Conjunctivae and EOM are normal. Pupils are equal, round, and reactive to light.  Neck: Normal range of motion. Neck supple.  Cardiovascular: Normal rate, regular rhythm, normal heart sounds and intact distal pulses.   Pulmonary/Chest: Effort normal and breath sounds normal.  Abdominal: Soft. Bowel sounds are normal.  Genitourinary: Cervix exhibits discharge. Vaginal discharge found.  Musculoskeletal: Normal range of motion.  Neurological: She is alert and oriented to person, place, and time.  Skin: Skin is warm.  Psychiatric: She has a normal mood and affect. Her behavior is normal. Judgment and thought content normal.  Nursing note and vitals reviewed.    ED Treatments / Results  Labs (all labs ordered are listed, but only abnormal results are displayed) Labs Reviewed  WET PREP, GENITAL - Abnormal; Notable for the following:       Result Value   Clue Cells Wet Prep HPF POC PRESENT (*)    WBC, Wet Prep HPF POC MODERATE (*)    All other components within normal limits  URINALYSIS, ROUTINE W REFLEX MICROSCOPIC - Abnormal; Notable for the following:    APPearance CLOUDY (*)    Ketones, ur 20 (*)    Protein, ur 30 (*)    Leukocytes, UA SMALL (*)    Bacteria, UA MANY (*)    Squamous Epithelial / LPF 6-30 (*)    All other components within normal limits  GC/CHLAMYDIA PROBE AMP ([place]) NOT AT [facility] - Abnormal; Notable for the following:    Chlamydia **POSITIVE** (*)    All other components within normal limits  PREGNANCY, URINE    EKG  EKG Interpretation None       Radiology No results found.  Procedures Procedures (including critical care time)  Medications Ordered in ED Medications  cefTRIAXone (ROCEPHIN) injection 250 mg (250 mg Intramuscular Given 11/07/16 2101)  doxycycline (VIBRA-TABS) tablet 100 mg (100 mg Oral Given 11/07/16 2101)     Initial Impression / Assessment and Plan / ED Course  I have reviewed the triage  vital signs and the nursing notes.  Pertinent labs & imaging results that were available during my care of the patient were reviewed by me and considered in my medical decision making (see chart for details).    I suspect pt has an STI as the cause of her sx.  Pt severely allergic to erythromycin, so she will be treated with 1 week of doxy.  She will also be given rocephin.    Pt's husband  also treated in the ED. Final Clinical Impressions(s) / ED Diagnoses   Final diagnoses:  Cervicitis  Bacterial vaginosis    New Prescriptions Discharge Medication List as of 11/07/2016  8:57 PM    START taking these medications   Details  doxycycline (VIBRAMYCIN) 100 MG capsule Take 1 capsule (100 mg total) by mouth 2 (two) times daily., Starting Tue 11/07/2016, Print    metroNIDAZOLE (FLAGYL) 500 MG tablet Take 1 tablet (500 mg total) by mouth 2 (two) times daily., Starting Tue 11/07/2016, Print    Saccharomyces boulardii (PROBIOTIC) 250 MG CAPS Take 1 capsule by mouth daily., Starting Tue 11/07/2016, Print         Isla Pence, MD 11/08/16 [phone]

## 2016-11-07 NOTE — ED Notes (Signed)
Denies fever, nausea, vomiting, and diarrhea.

## 2016-11-07 NOTE — ED Triage Notes (Signed)
Pt  Presents with 2 week h/o suprapubic pain and dysuria.  Pt reports urine is neon yellow with bad odor.  o

## 2016-11-08 LAB — GC/CHLAMYDIA PROBE AMP (~~LOC~~) NOT AT ARMC
CHLAMYDIA, DNA PROBE: POSITIVE — AB
Neisseria Gonorrhea: NEGATIVE

## 2016-11-23 ENCOUNTER — Ambulatory Visit: Payer: Medicaid Other | Admitting: Adult Health

## 2017-01-31 ENCOUNTER — Encounter: Payer: Self-pay | Admitting: Adult Health

## 2017-01-31 ENCOUNTER — Ambulatory Visit (INDEPENDENT_AMBULATORY_CARE_PROVIDER_SITE_OTHER): Payer: 59 | Admitting: Adult Health

## 2017-01-31 VITALS — BP 111/68 | HR 83 | Ht 60.5 in | Wt 208.8 lb

## 2017-01-31 DIAGNOSIS — Z0000 Encounter for general adult medical examination without abnormal findings: Secondary | ICD-10-CM

## 2017-01-31 DIAGNOSIS — K219 Gastro-esophageal reflux disease without esophagitis: Secondary | ICD-10-CM

## 2017-01-31 DIAGNOSIS — Z202 Contact with and (suspected) exposure to infections with a predominantly sexual mode of transmission: Secondary | ICD-10-CM

## 2017-01-31 DIAGNOSIS — F319 Bipolar disorder, unspecified: Secondary | ICD-10-CM | POA: Diagnosis not present

## 2017-01-31 DIAGNOSIS — R5383 Other fatigue: Secondary | ICD-10-CM | POA: Diagnosis not present

## 2017-01-31 DIAGNOSIS — E66813 Obesity, class 3: Secondary | ICD-10-CM | POA: Insufficient documentation

## 2017-01-31 NOTE — Patient Instructions (Signed)
Heart-Healthy Eating Plan Many factors influence your heart health, including eating and exercise habits. Heart (coronary) risk increases with abnormal blood fat (lipid) levels. Heart-healthy meal planning includes limiting unhealthy fats, increasing healthy fats, and making other small dietary changes. This includes maintaining a healthy body weight to help keep lipid levels within a normal range. What is my plan? Your health care provider recommends that you:  Get no more than _25_% of the total calories in your daily diet from fat.  Limit your intake of saturated fat to less than _5__% of your total calories each day.  Limit the amount of cholesterol in your diet to less than 300___ mg per day.  What types of fat should I choose?  Choose healthy fats more often. Choose monounsaturated and polyunsaturated fats, such as olive oil and canola oil, flaxseeds, walnuts, almonds, and seeds.  Eat more omega-3 fats. Good choices include salmon, mackerel, sardines, tuna, flaxseed oil, and ground flaxseeds. Aim to eat fish at least two times each week.  Limit saturated fats. Saturated fats are primarily found in animal products, such as meats, butter, and cream. Plant sources of saturated fats include palm oil, palm kernel oil, and coconut oil.  Avoid foods with partially hydrogenated oils in them. These contain trans fats. Examples of foods that contain trans fats are stick margarine, some tub margarines, cookies, crackers, and other baked goods. What general guidelines do I need to follow?  Check food labels carefully to identify foods with trans fats or high amounts of saturated fat.  Fill one half of your plate with vegetables and green salads. Eat 4-5 servings of vegetables per day. A serving of vegetables equals 1 cup of raw leafy vegetables,  cup of raw or cooked cut-up vegetables, or  cup of vegetable juice.  Fill one fourth of your plate with whole grains. Look for the word "whole" as the  first word in the ingredient list.  Fill one fourth of your plate with lean protein foods.  Eat 4-5 servings of fruit per day. A serving of fruit equals one medium whole fruit,  cup of dried fruit,  cup of fresh, frozen, or canned fruit, or  cup of 100% fruit juice.  Eat more foods that contain soluble fiber. Examples of foods that contain this type of fiber are apples, broccoli, carrots, beans, peas, and barley. Aim to get 20-30 g of fiber per day.  Eat more home-cooked food and less restaurant, buffet, and fast food.  Limit or avoid alcohol.  Limit foods that are high in starch and sugar.  Avoid fried foods.  Cook foods by using methods other than frying. Baking, boiling, grilling, and broiling are all great options. Other fat-reducing suggestions include: ? Removing the skin from poultry. ? Removing all visible fats from meats. ? Skimming the fat off of stews, soups, and gravies before serving them. ? Steaming vegetables in water or broth.  Lose weight if you are overweight. Losing just 5-10% of your initial body weight can help your overall health and prevent diseases such as diabetes and heart disease.  Increase your consumption of nuts, legumes, and seeds to 4-5 servings per week. One serving of dried beans or legumes equals  cup after being cooked, one serving of nuts equals 1 ounces, and one serving of seeds equals  ounce or 1 tablespoon.  You may need to monitor your salt (sodium) intake, especially if you have high blood pressure. Talk with your health care provider or dietitian to get  more information about reducing sodium. What foods can I eat? Grains  Breads, including [place], white, pita, wheat, raisin, rye, oatmeal, and [place]. Tortillas that are neither fried nor made with lard or trans fat. Low-fat rolls, including hotdog and hamburger buns and English muffins. Biscuits. Muffins. Waffles. Pancakes. Light popcorn. Whole-grain cereals. Flatbread. Melba toast.  Pretzels. Breadsticks. Rusks. Low-fat snacks and crackers, including oyster, saltine, matzo, graham, animal, and rye. Rice and pasta, including brown rice and those that are made with whole wheat. Vegetables All vegetables. Fruits All fruits, but limit coconut. Meats and Other Protein Sources Lean, well-trimmed beef, veal, pork, and lamb. Chicken and [place] without skin. All fish and shellfish. Wild duck, rabbit, pheasant, and venison. Egg whites or low-cholesterol egg substitutes. Dried beans, peas, lentils, and tofu.Seeds and most nuts. Dairy Low-fat or nonfat cheeses, including ricotta, string, and mozzarella. Skim or 1% milk that is liquid, powdered, or evaporated. Buttermilk that is made with low-fat milk. Nonfat or low-fat yogurt. Beverages Mineral water. Diet carbonated beverages. Sweets and Desserts Sherbets and fruit ices. Honey, jam, marmalade, jelly, and syrups. Meringues and gelatins. Pure sugar candy, such as hard candy, jelly beans, gumdrops, mints, marshmallows, and small amounts of dark chocolate. [employer] Eat all sweets and desserts in moderation. Fats and Oils Nonhydrogenated (trans-free) margarines. Vegetable oils, including soybean, sesame, sunflower, olive, peanut, safflower, corn, canola, and cottonseed. Salad dressings or mayonnaise that are made with a vegetable oil. Limit added fats and oils that you use for cooking, baking, salads, and as spreads. Other Cocoa powder. Coffee and tea. All seasonings and condiments. The items listed above may not be a complete list of recommended foods or beverages. Contact your dietitian for more options. What foods are not recommended? Grains Breads that are made with saturated or trans fats, oils, or whole milk. Croissants. Butter rolls. Cheese breads. Sweet rolls. Donuts. Buttered popcorn. Chow mein noodles. High-fat crackers, such as cheese or butter crackers. Meats and Other Protein Sources Fatty meats, such as hotdogs,  short ribs, sausage, spareribs, bacon, ribeye roast or steak, and mutton. High-fat deli meats, such as salami and bologna. Caviar. Domestic duck and goose. Organ meats, such as kidney, liver, sweetbreads, brains, gizzard, chitterlings, and heart. Dairy Cream, sour cream, cream cheese, and creamed cottage cheese. Whole milk cheeses, including blue (bleu), Monterey Jack, [place], [place], American, [place], Swiss, [place], [place], and [place]. Whole or 2% milk that is liquid, evaporated, or condensed. Whole buttermilk. Cream sauce or high-fat cheese sauce. Yogurt that is made from whole milk. Beverages Regular sodas and drinks with added sugar. Sweets and Desserts Frosting. Pudding. Cookies. Cakes other than angel food cake. Candy that has milk chocolate or white chocolate, hydrogenated fat, butter, coconut, or unknown ingredients. Buttered syrups. Full-fat ice cream or ice cream drinks. Fats and Oils Gravy that has suet, meat fat, or shortening. Cocoa butter, hydrogenated oils, palm oil, coconut oil, palm kernel oil. These can often be found in baked products, candy, fried foods, nondairy creamers, and whipped toppings. Solid fats and shortenings, including bacon fat, salt pork, lard, and butter. Nondairy cream substitutes, such as coffee creamers and sour cream substitutes. Salad dressings that are made of unknown oils, cheese, or sour cream. The items listed above may not be a complete list of foods and beverages to avoid. Contact your dietitian for more information. This information is not intended to replace advice given to you by your health care provider. Make sure you discuss any questions you have with your health care  provider. Document Released: 03/14/2008 Document Revised: 12/24/2015 Document Reviewed: 11/27/2013 Elsevier Interactive Patient Education  2017 [place].  Increase water intake, strive for at least 100 ounces/day. Follow heart healthy diet, try meal prepping to have healthier  options while at work. We will call you when lab results are available. Please schedule nurse visit at your convenience for fasting labs. Please schedule complete physical this fall. WELCOME TO THE PRACTICE!

## 2017-01-31 NOTE — Assessment & Plan Note (Signed)
Follow heart healthy diet. Reduce spicy/acidic foods. Reduce-stop soda consumption. Increase water, strive to drink at least 100 ounces/day. Increase regular movement, at least 165mins/week.-walking, biking, swimming, YouTube/Pinterest Videos.

## 2017-01-31 NOTE — Progress Notes (Signed)
Subjective:    Patient ID: [name], female    DOB: 12/20/1985, 31 y.o.   MRN: [id number]  HPI:  [name] is here to establish as a new pt.  She is a very pleasant 31 year old female.  PMH:  Bipolar I, obesity, fatigue, and Von Willebrand Disease. She was recently treated for STI/UTI/BV at local ED, however did not complete ABX course.  She denies tobacco/EOTH use. She only drinks soda/sweet tea, no water at all. Diet is high in saturated fat/sugar/CHO. She was dx'd with Bipolar I disorder winter 2018 and Depakote and Prozac and visits psychiatrist 1/2 times/month.  She feels that her sx's are well controlled and denies thoughts of harming herself/others.  She works Oncologist shift" as an Education officer, community and extremely fatigued. She denies thoughts of harming herself/others.   Patient Care Team    Relationship Specialty Notifications Start End  Esaw Grandchild, NP PCP - General Family Medicine  01/23/17     Patient Active Problem List   Diagnosis Date Noted  . Esophageal reflux   . History of gestational diabetes 06/10/2015     Past Medical History:  Diagnosis Date  . Anemia    after pregnancy  . Bipolar 1 disorder ([place])   . Blood dyscrasia    takes 11 1/2 minutes to clot - as of 01/20/16 no longer seems to have this problem  . Depression   . GERD (gastroesophageal reflux disease)    with pregnancy  . Gestational diabetes mellitus (GDM), antepartum   . Heart murmur    states she outgrew it  . History of IBS    in high school  . Kidney stones   . Migraine   . Uterine fibroid      Past Surgical History:  Procedure Laterality Date  . CESAREAN SECTION     Nov 2011  . CESAREAN SECTION WITH BILATERAL TUBAL LIGATION Bilateral 07/26/2015   Procedure: C section ;  Surgeon: Mora Bellman, MD;  Location: [facility];  Service: Obstetrics;  Laterality: Bilateral;  . RENAL ARTERY STENT     placed and removed in 2005 - stents for kidney stones  . TOOTH EXTRACTION N/A 01/21/2016   Procedure: DENTAL RESTORATION/EXTRACTIONS;  Surgeon: Diona Browner, DDS;  Location: [facility];  Service: Oral Surgery;  Laterality: N/A;  . TUBAL LIGATION Bilateral 07/26/2015   Procedure: BILATERAL TUBAL LIGATION;  Surgeon: Mora Bellman, MD;  Location: [facility];  Service: Obstetrics;  Laterality: Bilateral;     Family History  Problem Relation Age of Onset  . Hypertension Mother   . Depression Mother   . Diabetes Father   . Hypertension Father   . Heart disease Father   . Cancer Father        foot     History  Drug Use No     History  Alcohol Use  . 0.0 oz/week    Comment: occ     History  Smoking Status  . Never Smoker  Smokeless Tobacco  . Never Used     Outpatient Encounter Prescriptions as of 01/31/2017  Medication Sig  . ibuprofen (ADVIL,MOTRIN) 200 MG tablet Take 800 mg by mouth every 6 (six) hours as needed for headache, mild pain or moderate pain.  . [DISCONTINUED] amoxicillin-clavulanate (AUGMENTIN) 875-125 MG tablet Take 1 tablet by mouth every 12 (twelve) hours.  . [DISCONTINUED] doxycycline (VIBRAMYCIN) 100 MG capsule Take 1 capsule (100 mg total) by mouth 2 (two) times daily.  . [DISCONTINUED] metroNIDAZOLE (FLAGYL)  500 MG tablet Take 1 tablet (500 mg total) by mouth 2 (two) times daily.  . [DISCONTINUED] phenylephrine (SUDAFED PE) 10 MG TABS tablet Take 20 mg by mouth every 4 (four) hours as needed (for congestion).  . [DISCONTINUED] Saccharomyces boulardii (PROBIOTIC) 250 MG CAPS Take 1 capsule by mouth daily.   No facility-administered encounter medications on file as of 01/31/2017.     Allergies: Erythromycin; Latex; Food; and Ciprofloxacin  Body mass index is 40.11 kg/m.  Blood pressure 111/68, pulse 83, height 5' 0.5" (1.537 m), weight 208 lb 12.8 oz (94.7 kg), last menstrual period 01/12/2017, not currently breastfeeding.   Review of Systems  Constitutional: Positive for fatigue. Negative for activity change, appetite change, chills,  diaphoresis, fever and unexpected weight change.  Eyes: Negative for visual disturbance.  Respiratory: Negative for cough, chest tightness, shortness of breath, wheezing and stridor.   Cardiovascular: Negative for chest pain, palpitations and leg swelling.  Gastrointestinal: Negative for abdominal distention, abdominal pain, blood in stool, constipation, diarrhea, nausea and vomiting.  Endocrine: Negative for cold intolerance, heat intolerance, polydipsia, polyphagia and polyuria.  Genitourinary: Negative for difficulty urinating, dyspareunia, dysuria, flank pain, genital sores, hematuria, menstrual problem, pelvic pain and vaginal discharge.  Musculoskeletal: Negative for arthralgias, back pain, gait problem, joint swelling, myalgias and neck stiffness.  Skin: Negative for color change, pallor, rash and wound.  Allergic/Immunologic: Negative for immunocompromised state.  Neurological: Negative for dizziness, tremors, weakness and headaches.  Psychiatric/Behavioral: Positive for sleep disturbance. Negative for decreased concentration, dysphoric mood, hallucinations, self-injury and suicidal ideas. The patient is not nervous/anxious and is not hyperactive.        Objective:   Physical Exam  Constitutional: She is oriented to person, place, and time. She appears well-developed and well-nourished. No distress.  HENT:  Head: Normocephalic and atraumatic.  Right Ear: External ear normal.  Left Ear: External ear normal.  Eyes: Pupils are equal, round, and reactive to light. Conjunctivae are normal.  Neck: Normal range of motion. Neck supple.  Cardiovascular: Normal rate, regular rhythm, normal heart sounds and intact distal pulses.   No murmur heard. Pulmonary/Chest: Effort normal and breath sounds normal. No respiratory distress. She has no wheezes. She has no rales. She exhibits no tenderness.  Lymphadenopathy:    She has no cervical adenopathy.  Neurological: She is alert and oriented to  person, place, and time. Coordination normal.  Skin: Skin is warm and dry. No rash noted. She is not diaphoretic. No erythema. No pallor.  Psychiatric: She has a normal mood and affect. Her behavior is normal. Judgment and thought content normal.  Nursing note and vitals reviewed.         Assessment & Plan:   1. Healthcare maintenance   2. Exposure to STD   3. Other fatigue   4. Obesity, Class III, BMI 40-49.9 (morbid obesity) ([place])   5. Gastroesophageal reflux disease, esophagitis presence not specified   6. Bipolar 1 disorder (H[facility])     Esophageal reflux Reduce spicy/acidic foods. Reduce-stop soda consumption. Increase water, strive to drink at least 100 ounces/day.  Obesity, Class III, BMI 40-49.9 (morbid obesity) ([place]) Follow heart healthy diet. Reduce spicy/acidic foods. Reduce-stop soda consumption. Increase water, strive to drink at least 100 ounces/day. Increase regular movement, at least 181mins/week.-walking, biking, swimming, YouTube/Pinterest Videos.   Bipolar 1 disorder (HCC) Continue depakote 500mg  and prozac 20mg  daily. Continue with psychiatrist and recommend CBT as well.    FOLLOW-UP:  No Follow-up on file.

## 2017-01-31 NOTE — Assessment & Plan Note (Signed)
Reduce spicy/acidic foods. Reduce-stop soda consumption. Increase water, strive to drink at least 100 ounces/day.

## 2017-01-31 NOTE — Assessment & Plan Note (Signed)
Continue depakote 500mg  and prozac 20mg  daily. Continue with psychiatrist and recommend CBT as well.

## 2017-02-01 ENCOUNTER — Other Ambulatory Visit: Payer: 59

## 2017-02-01 DIAGNOSIS — Z0000 Encounter for general adult medical examination without abnormal findings: Secondary | ICD-10-CM

## 2017-02-01 DIAGNOSIS — R5383 Other fatigue: Secondary | ICD-10-CM | POA: Diagnosis not present

## 2017-02-01 DIAGNOSIS — Z202 Contact with and (suspected) exposure to infections with a predominantly sexual mode of transmission: Secondary | ICD-10-CM

## 2017-02-02 LAB — CBC WITH DIFFERENTIAL/PLATELET
BASOS: 1 %
Basophils Absolute: 0 10*3/uL (ref 0.0–0.2)
EOS (ABSOLUTE): 0.1 10*3/uL (ref 0.0–0.4)
Eos: 1 %
HEMATOCRIT: 38.6 % (ref 34.0–46.6)
HEMOGLOBIN: 12.8 g/dL (ref 11.1–15.9)
IMMATURE GRANS (ABS): 0 10*3/uL (ref 0.0–0.1)
IMMATURE GRANULOCYTES: 0 %
Lymphocytes Absolute: 1.7 10*3/uL (ref 0.7–3.1)
Lymphs: 26 %
MCH: 29.4 pg (ref 26.6–33.0)
MCHC: 33.2 g/dL (ref 31.5–35.7)
MCV: 89 fL (ref 79–97)
Monocytes Absolute: 0.4 10*3/uL (ref 0.1–0.9)
Monocytes: 5 %
NEUTROS PCT: 67 %
Neutrophils Absolute: 4.4 10*3/uL (ref 1.4–7.0)
PLATELETS: 231 10*3/uL (ref 150–379)
RBC: 4.36 x10E6/uL (ref 3.77–5.28)
RDW: 14.7 % (ref 12.3–15.4)
WBC: 6.6 10*3/uL (ref 3.4–10.8)

## 2017-02-02 LAB — COMPREHENSIVE METABOLIC PANEL
A/G RATIO: 1.3 (ref 1.2–2.2)
ALBUMIN: 4.1 g/dL (ref 3.5–5.5)
ALT: 15 IU/L (ref 0–32)
AST: 18 IU/L (ref 0–40)
Alkaline Phosphatase: 77 IU/L (ref 39–117)
BUN / CREAT RATIO: 17 (ref 9–23)
BUN: 9 mg/dL (ref 6–20)
Bilirubin Total: 0.3 mg/dL (ref 0.0–1.2)
CALCIUM: 9.3 mg/dL (ref 8.7–10.2)
CHLORIDE: 105 mmol/L (ref 96–106)
CO2: 22 mmol/L (ref 20–29)
Creatinine, Ser: 0.52 mg/dL — ABNORMAL LOW (ref 0.57–1.00)
GFR, EST AFRICAN AMERICAN: 147 mL/min/{1.73_m2} (ref >59)
GFR, EST NON AFRICAN AMERICAN: 128 mL/min/{1.73_m2} (ref >59)
Globulin, Total: 3.1 g/dL (ref 1.5–4.5)
Glucose: 96 mg/dL (ref 65–99)
POTASSIUM: 4.2 mmol/L (ref 3.5–5.2)
Sodium: 140 mmol/L (ref 134–144)
TOTAL PROTEIN: 7.2 g/dL (ref 6.0–8.5)

## 2017-02-02 LAB — LIPID PANEL
CHOL/HDL RATIO: 4.1 ratio (ref 0.0–4.4)
CHOLESTEROL TOTAL: 160 mg/dL (ref 100–199)
HDL: 39 mg/dL — ABNORMAL LOW (ref >39)
LDL CALC: 82 mg/dL (ref 0–99)
TRIGLYCERIDES: 196 mg/dL — AB (ref 0–149)
VLDL CHOLESTEROL CAL: 39 mg/dL (ref 5–40)

## 2017-02-02 LAB — RPR: RPR Ser Ql: NONREACTIVE

## 2017-02-02 LAB — HIV ANTIBODY (ROUTINE TESTING W REFLEX): HIV Screen 4th Generation wRfx: NONREACTIVE

## 2017-02-02 LAB — HEMOGLOBIN A1C
Est. average glucose Bld gHb Est-mCnc: 97 mg/dL
Hgb A1c MFr Bld: 5 % (ref 4.8–5.6)

## 2017-02-02 LAB — HEPATITIS C ANTIBODY

## 2017-02-02 LAB — VITAMIN D 25 HYDROXY (VIT D DEFICIENCY, FRACTURES): VIT D 25 HYDROXY: 21.2 ng/mL — AB (ref 30.0–100.0)

## 2017-02-02 LAB — TSH: TSH: 1.54 u[IU]/mL (ref 0.450–4.500)

## 2017-02-03 LAB — GC/CHLAMYDIA PROBE AMP
Chlamydia trachomatis, NAA: NEGATIVE
NEISSERIA GONORRHOEAE BY PCR: NEGATIVE

## 2017-02-07 ENCOUNTER — Other Ambulatory Visit: Payer: Self-pay | Admitting: Adult Health

## 2017-02-07 DIAGNOSIS — E559 Vitamin D deficiency, unspecified: Secondary | ICD-10-CM

## 2017-02-07 MED ORDER — VITAMIN D (ERGOCALCIFEROL) 1.25 MG (50000 UNIT) PO CAPS: 50000.0000 [IU] | ORAL_CAPSULE | ORAL | 0 refills | Status: DC

## 2017-02-08 DIAGNOSIS — Z202 Contact with and (suspected) exposure to infections with a predominantly sexual mode of transmission: Secondary | ICD-10-CM | POA: Insufficient documentation

## 2017-02-13 LAB — HSV(HERPES SIMPLEX VRS) I + II AB-IGG
HSV 1 Glycoprotein G Ab, IgG: 38 index — ABNORMAL HIGH (ref 0.00–0.90)
HSV 2 IgG, Type Spec: 5.5 index — ABNORMAL HIGH (ref 0.00–0.90)

## 2017-02-13 LAB — SPECIMEN STATUS REPORT

## 2017-02-13 LAB — HSV(HERPES SIMPLEX VRS) I + II AB-IGM: HSVI/II Comb IgM: 1.51 Ratio — ABNORMAL HIGH (ref 0.00–0.90)

## 2017-02-15 ENCOUNTER — Ambulatory Visit: Payer: 59 | Admitting: Adult Health

## 2017-03-15 ENCOUNTER — Encounter: Payer: 59 | Admitting: Adult Health

## 2017-03-29 ENCOUNTER — Encounter: Payer: Self-pay | Admitting: Adult Health

## 2017-03-29 ENCOUNTER — Other Ambulatory Visit: Payer: Self-pay | Admitting: Adult Health

## 2017-03-29 ENCOUNTER — Ambulatory Visit (INDEPENDENT_AMBULATORY_CARE_PROVIDER_SITE_OTHER): Payer: 59 | Admitting: Adult Health

## 2017-03-29 VITALS — BP 101/69 | HR 79 | Temp 98.7°F | Ht 60.5 in | Wt 212.5 lb

## 2017-03-29 DIAGNOSIS — S21001A Unspecified open wound of right breast, initial encounter: Secondary | ICD-10-CM

## 2017-03-29 DIAGNOSIS — Z0000 Encounter for general adult medical examination without abnormal findings: Secondary | ICD-10-CM | POA: Diagnosis not present

## 2017-03-29 DIAGNOSIS — Z23 Encounter for immunization: Secondary | ICD-10-CM | POA: Diagnosis not present

## 2017-03-29 DIAGNOSIS — B009 Herpesviral infection, unspecified: Secondary | ICD-10-CM | POA: Insufficient documentation

## 2017-03-29 DIAGNOSIS — R234 Changes in skin texture: Secondary | ICD-10-CM

## 2017-03-29 NOTE — Progress Notes (Signed)
Subjective:    Patient ID: [name], female    DOB: [birth year], 31 y.o.   MRN: [id number]  HPI:  01/31/2017 OV Notes: [name] is here to establish as a new pt.  She is a very pleasant 31 year old female.  PMH:  Bipolar I, obesity, fatigue, and Von Willebrand Disease. She was recently treated for STI/UTI/BV at local ED, however did not complete ABX course.  She denies tobacco/EOTH use. She only drinks soda/sweet tea, no water at all. Diet is high in saturated fat/sugar/CHO. She was dx'd with Bipolar I disorder winter 2018 and Depakote and Prozac and visits psychiatrist 1/2 times/month.  She feels that her sx's are well controlled and denies thoughts of harming herself/others.  She works Oncologist shift" as an Education officer, community and extremely fatigued. She denies thoughts of harming herself/others.  Today's OV notes 03/29/2017: [name] is here for CPE and to review labs.  She denies any acute changes since initial OV and has one current complaint: non-healing R breast site. R Breast- noticed > 1 year ago and it will fill with "pus" every few months and she will express it which yields bloody/yellow discharge- last time she did this was "a few months ago"-? She is unsure.  She denies pain at site.  She denies any other changes in either breast. She denies family hx of breast ca. She has not discussed this with her current OB/GYN. She denies fever/night sweats/unexplained wt loss. She is trying to drink more water and due to her floating work schedule does not exercise.  She denies tobacco/ETOH use.  Pt gave verbal permission-usband at [facility] during examination Healthcare Mx: PAP- due 06/20419 Immunizations-UTD, will return for flu shot this fall Reviewed labs at length, she is aware of HSV results and declined antiviral tx.  Stressed importance of safe sex practices. Patient Care Team    Relationship Specialty Notifications Start End  Esaw Grandchild, NP PCP - General Family Medicine  01/23/17      Patient Active Problem List   Diagnosis Date Noted  . Exposure to STD 02/08/2017  . Obesity, Class III, BMI 40-49.9 (morbid obesity) ([place]) 01/31/2017  . Bipolar 1 disorder ([place]) 01/31/2017  . Esophageal reflux   . History of gestational diabetes 06/10/2015     Past Medical History:  Diagnosis Date  . Anemia    after pregnancy  . Bipolar 1 disorder ([place])   . Blood dyscrasia    takes 11 1/2 minutes to clot - as of 01/20/16 no longer seems to have this problem  . Depression   . GERD (gastroesophageal reflux disease)    with pregnancy  . Gestational diabetes mellitus (GDM), antepartum   . Heart murmur    states she outgrew it  . History of IBS    in high school  . Kidney stones   . Migraine   . Uterine fibroid      Past Surgical History:  Procedure Laterality Date  . CESAREAN SECTION     Nov 2011  . CESAREAN SECTION WITH BILATERAL TUBAL LIGATION Bilateral 07/26/2015   Procedure: C section ;  Surgeon: Mora Bellman, MD;  Location: [facility];  Service: Obstetrics;  Laterality: Bilateral;  . RENAL ARTERY STENT     placed and removed in 2005 - stents for kidney stones  . TOOTH EXTRACTION N/A 01/21/2016   Procedure: DENTAL RESTORATION/EXTRACTIONS;  Surgeon: Diona Browner, DDS;  Location: [facility];  Service: Oral Surgery;  Laterality: N/A;  . TUBAL  LIGATION Bilateral 07/26/2015   Procedure: BILATERAL TUBAL LIGATION;  Surgeon: Mora Bellman, MD;  Location: [facility];  Service: Obstetrics;  Laterality: Bilateral;     Family History  Problem Relation Age of Onset  . Hypertension Mother   . Depression Mother   . Diabetes Father   . Hypertension Father   . Heart disease Father   . Cancer Father        foot     History  Drug Use No     History  Alcohol Use  . 0.0 oz/week    Comment: occ     History  Smoking Status  . Never Smoker  Smokeless Tobacco  . Never Used     Outpatient Encounter Prescriptions as of 03/29/2017  Medication Sig  . divalproex (DEPAKOTE) 500  MG DR tablet Take 500 mg by mouth daily.  Marland Kitchen FLUoxetine (PROZAC) 20 MG tablet Take 20 mg by mouth daily.  Marland Kitchen ibuprofen (ADVIL,MOTRIN) 200 MG tablet Take 800 mg by mouth every 6 (six) hours as needed for headache, mild pain or moderate pain.  . Vitamin D, Ergocalciferol, (DRISDOL) 50000 units CAPS capsule Take 1 capsule (50,000 Units total) by mouth every 7 (seven) days. (Patient not taking: Reported on 03/29/2017)  . [DISCONTINUED] divalproex (DEPAKOTE) 500 MG DR tablet Take 500 mg by mouth 2 (two) times daily.   No facility-administered encounter medications on file as of 03/29/2017.     Allergies: Erythromycin; Latex; Food; and Ciprofloxacin  Body mass index is 40.82 kg/m.  Blood pressure 101/69, pulse 79, temperature 98.7 F (37.1 C), temperature source Oral, height 5' 0.5" (1.537 m), weight 212 lb 8 oz (96.4 kg), last menstrual period 03/10/2017, not currently breastfeeding.   Review of Systems  Constitutional: Positive for fatigue. Negative for activity change, appetite change, chills, diaphoresis, fever and unexpected weight change.  Eyes: Negative for visual disturbance.  Respiratory: Negative for cough, chest tightness, shortness of breath, wheezing and stridor.   Cardiovascular: Negative for chest pain, palpitations and leg swelling.  Gastrointestinal: Negative for abdominal distention, abdominal pain, blood in stool, constipation, diarrhea, nausea and vomiting.  Endocrine: Negative for cold intolerance, heat intolerance, polydipsia, polyphagia and polyuria.  Genitourinary: Negative for difficulty urinating, dyspareunia, dysuria, flank pain, genital sores, hematuria, menstrual problem, pelvic pain and vaginal discharge.  Musculoskeletal: Negative for arthralgias, back pain, gait problem, joint swelling, myalgias and neck stiffness.  Skin: Negative for color change, pallor, rash and wound.  Allergic/Immunologic: Negative for immunocompromised state.  Neurological: Negative for  dizziness, tremors, weakness and headaches.  Psychiatric/Behavioral: Positive for sleep disturbance. Negative for decreased concentration, dysphoric mood, hallucinations, self-injury and suicidal ideas. The patient is not nervous/anxious and is not hyperactive.        Objective:   Physical Exam  Constitutional: She is oriented to person, place, and time. She appears well-developed and well-nourished. No distress.  HENT:  Head: Normocephalic and atraumatic.  Right Ear: Hearing, tympanic membrane, external ear and ear canal normal. Tympanic membrane is not erythematous and not bulging. No decreased hearing is noted.  Left Ear: Hearing, tympanic membrane, external ear and ear canal normal. Tympanic membrane is not erythematous and not bulging. No decreased hearing is noted.  Nose: Nose normal. Right sinus exhibits no maxillary sinus tenderness and no frontal sinus tenderness. Left sinus exhibits no maxillary sinus tenderness and no frontal sinus tenderness.  Mouth/Throat: Uvula is midline and oropharynx is clear and moist. Abnormal dentition. Dental caries present.  Eyes: Pupils are equal, round, and reactive to light.  Conjunctivae are normal.  Neck: Normal range of motion. Neck supple.  Cardiovascular: Normal rate, regular rhythm, normal heart sounds and intact distal pulses.   No murmur heard. Pulmonary/Chest: Effort normal and breath sounds normal. No respiratory distress. She has no wheezes. She has no rales. She exhibits no tenderness. Right breast exhibits skin change. Right breast exhibits no inverted nipple, no nipple discharge and no tenderness. Left breast exhibits no inverted nipple, no nipple discharge, no skin change and no tenderness.    1.6cm by 2.1cm flat, darkened area of skin. At bottom/left of area a few superficial layers of skin missing. No mass underneath site appreciated. No excessive warmth/streaking/discharge noted.   Abdominal: Soft. Bowel sounds are normal.    Musculoskeletal: Normal range of motion.  Lymphadenopathy:    She has no cervical adenopathy.  Neurological: She is alert and oriented to person, place, and time. Coordination normal.  Skin: Skin is warm and dry. No rash noted. She is not diaphoretic. No erythema. No pallor.  Psychiatric: She has a normal mood and affect. Her behavior is normal. Judgment and thought content normal.  Nursing note and vitals reviewed.         Assessment & Plan:   1. Need for influenza vaccination     No problem-specific Assessment & Plan notes found for this encounter.  1. Need for influenza vaccination   2. Wound of right breast, initial encounter   3. Healthcare maintenance   4. HSV (herpes simplex virus) infection     Wound of right breast R breast US ordered Referral to dermatology placed  Healthcare maintenance Please pick up rx vit d and take once weekly for 16 weeks. We will re-check your vit d level in 4 months. Discussed labs at length- needs to increase exercise and reduce CHO/sugar intake. She is aware of HSV results and declined antiviral tx.  Stressed importance of safe sex practices. Increase water intake, strive for at least 110 ounces/day.   Follow Heart Healthy diet Increase regular exercise.  Recommend at least 30 minutes daily, 5 days per week of walking, jogging, biking, swimming, YouTube/Pinterest workout videos. Follow-up in year, sooner  If needed.  HSV (herpes simplex virus) infection She is aware of HSV results and declined antiviral tx.  Stressed importance of safe sex practices.    FOLLOW-UP:  Return in about 1 year (around 03/29/2018) for CPE.

## 2017-03-29 NOTE — Assessment & Plan Note (Signed)
She is aware of HSV results and declined antiviral tx.  Stressed importance of safe sex practices.

## 2017-03-29 NOTE — Assessment & Plan Note (Signed)
R breast US ordered Referral to dermatology placed

## 2017-03-29 NOTE — Patient Instructions (Addendum)
Heart-Healthy Eating Plan Heart-healthy meal planning includes:  Limiting unhealthy fats.  Increasing healthy fats.  Making other small dietary changes.  You may need to talk with your doctor or a diet specialist (dietitian) to create an eating plan that is right for you. What types of fat should I choose?  Choose healthy fats. These include olive oil and canola oil, flaxseeds, walnuts, almonds, and seeds.  Eat more omega-3 fats. These include salmon, mackerel, sardines, tuna, flaxseed oil, and ground flaxseeds. Try to eat fish at least twice each week.  Limit saturated fats. ? Saturated fats are often found in animal products, such as meats, butter, and cream. ? Plant sources of saturated fats include palm oil, palm kernel oil, and coconut oil.  Avoid foods with partially hydrogenated oils in them. These include stick margarine, some tub margarines, cookies, crackers, and other baked goods. These contain trans fats. What general guidelines do I need to follow?  Check food labels carefully. Identify foods with trans fats or high amounts of saturated fat.  Fill one half of your plate with vegetables and green salads. Eat 4-5 servings of vegetables per day. A serving of vegetables is: ? 1 cup of raw leafy vegetables. ?  cup of raw or cooked cut-up vegetables. ?  cup of vegetable juice.  Fill one fourth of your plate with whole grains. Look for the word "whole" as the first word in the ingredient list.  Fill one fourth of your plate with lean protein foods.  Eat 4-5 servings of fruit per day. A serving of fruit is: ? One medium whole fruit. ?  cup of dried fruit. ?  cup of fresh, frozen, or canned fruit. ?  cup of 100% fruit juice.  Eat more foods that contain soluble fiber. These include apples, broccoli, carrots, beans, peas, and barley. Try to get 20-30 g of fiber per day.  Eat more home-cooked food. Eat less restaurant, buffet, and fast food.  Limit or avoid  alcohol.  Limit foods high in starch and sugar.  Avoid fried foods.  Avoid frying your food. Try baking, boiling, grilling, or broiling it instead. You can also reduce fat by: ? Removing the skin from poultry. ? Removing all visible fats from meats. ? Skimming the fat off of stews, soups, and gravies before serving them. ? Steaming vegetables in water or broth.  Lose weight if you are overweight.  Eat 4-5 servings of nuts, legumes, and seeds per week: ? One serving of dried beans or legumes equals  cup after being cooked. ? One serving of nuts equals 1 ounces. ? One serving of seeds equals  ounce or one tablespoon.  You may need to keep track of how much salt or sodium you eat. This is especially true if you have high blood pressure. Talk with your doctor or dietitian to get more information. What foods can I eat? Grains Breads, including French, white, pita, wheat, raisin, rye, oatmeal, and Italian. Tortillas that are neither fried nor made with lard or trans fat. Low-fat rolls, including hotdog and hamburger buns and English muffins. Biscuits. Muffins. Waffles. Pancakes. Light popcorn. Whole-grain cereals. Flatbread. Melba toast. Pretzels. Breadsticks. Rusks. Low-fat snacks. Low-fat crackers, including oyster, saltine, matzo, graham, animal, and rye. Rice and pasta, including brown rice and pastas that are made with whole wheat. Vegetables All vegetables. Fruits All fruits, but limit coconut. Meats and Other Protein Sources Lean, well-trimmed beef, veal, pork, and lamb. Chicken and turkey without skin. All fish and shellfish.   Wild duck, rabbit, pheasant, and venison. Egg whites or low-cholesterol egg substitutes. Dried beans, peas, lentils, and tofu. Seeds and most nuts. Dairy Low-fat or nonfat cheeses, including ricotta, string, and mozzarella. Skim or 1% milk that is liquid, powdered, or evaporated. Buttermilk that is made with low-fat milk. Nonfat or low-fat  yogurt. Beverages Mineral water. Diet carbonated beverages. Sweets and Desserts Sherbets and fruit ices. Honey, jam, marmalade, jelly, and syrups. Meringues and gelatins. Pure sugar candy, such as hard candy, jelly beans, gumdrops, mints, marshmallows, and small amounts of dark chocolate. [employer] Eat all sweets and desserts in moderation. Fats and Oils Nonhydrogenated (trans-free) margarines. Vegetable oils, including soybean, sesame, sunflower, olive, peanut, safflower, corn, canola, and cottonseed. Salad dressings or mayonnaise made with a vegetable oil. Limit added fats and oils that you use for cooking, baking, salads, and as spreads. Other Cocoa powder. Coffee and tea. All seasonings and condiments. The items listed above may not be a complete list of recommended foods or beverages. Contact your dietitian for more options. What foods are not recommended? Grains Breads that are made with saturated or trans fats, oils, or whole milk. Croissants. Butter rolls. Cheese breads. Sweet rolls. Donuts. Buttered popcorn. Chow mein noodles. High-fat crackers, such as cheese or butter crackers. Meats and Other Protein Sources Fatty meats, such as hotdogs, short ribs, sausage, spareribs, bacon, rib eye roast or steak, and mutton. High-fat deli meats, such as salami and bologna. Caviar. Domestic duck and goose. Organ meats, such as kidney, liver, sweetbreads, and heart. Dairy Cream, sour cream, cream cheese, and creamed cottage cheese. Whole-milk cheeses, including blue (bleu), Monterey Jack, [place], [place], American, [place], Swiss, cheddar, [place], and [place]. Whole or 2% milk that is liquid, evaporated, or condensed. Whole buttermilk. Cream sauce or high-fat cheese sauce. Yogurt that is made from whole milk. Beverages Regular sodas and juice drinks with added sugar. Sweets and Desserts Frosting. Pudding. Cookies. Cakes other than angel food cake. Candy that has milk chocolate or white  chocolate, hydrogenated fat, butter, coconut, or unknown ingredients. Buttered syrups. Full-fat ice cream or ice cream drinks. Fats and Oils Gravy that has suet, meat fat, or shortening. Cocoa butter, hydrogenated oils, palm oil, coconut oil, palm kernel oil. These can often be found in baked products, candy, fried foods, nondairy creamers, and whipped toppings. Solid fats and shortenings, including bacon fat, salt pork, lard, and butter. Nondairy cream substitutes, such as coffee creamers and sour cream substitutes. Salad dressings that are made of unknown oils, cheese, or sour cream. The items listed above may not be a complete list of foods and beverages to avoid. Contact your dietitian for more information. This information is not intended to replace advice given to you by your health care provider. Make sure you discuss any questions you have with your health care provider. Document Released: 12/05/2011 Document Revised: 11/11/2015 Document Reviewed: 11/27/2013 Elsevier Interactive Patient Education  2018 [employer].  Please pick up rx vit d and take once weekly for 16 weeks. We will re-check your vit d level in 4 months. Right breast ultrasound and referral to dermatology placed to address non-healing site on right breast. Increase water intake, strive for at least 110 ounces/day.   Follow Heart Healthy diet Increase regular exercise.  Recommend at least 30 minutes daily, 5 days per week of walking, jogging, biking, swimming, YouTube/Pinterest workout videos. Follow-up in year, sooner  If needed. NICE TO SEE YOU!

## 2017-03-29 NOTE — Assessment & Plan Note (Addendum)
Please pick up rx vit d and take once weekly for 16 weeks. We will re-check your vit d level in 4 months. Discussed labs at length- needs to increase exercise and reduce CHO/sugar intake. She is aware of HSV results and declined antiviral tx.  Stressed importance of safe sex practices. Increase water intake, strive for at least 110 ounces/day.   Follow Heart Healthy diet Increase regular exercise.  Recommend at least 30 minutes daily, 5 days per week of walking, jogging, biking, swimming, YouTube/Pinterest workout videos. Follow-up in year, sooner  If needed.

## 2017-04-03 ENCOUNTER — Other Ambulatory Visit: Payer: 59

## 2017-04-03 ENCOUNTER — Ambulatory Visit (INDEPENDENT_AMBULATORY_CARE_PROVIDER_SITE_OTHER): Payer: 59 | Admitting: Adult Health

## 2017-04-03 ENCOUNTER — Encounter: Payer: Self-pay | Admitting: Adult Health

## 2017-04-03 VITALS — BP 127/81 | HR 54 | Ht 60.5 in | Wt 212.4 lb

## 2017-04-03 DIAGNOSIS — E639 Nutritional deficiency, unspecified: Secondary | ICD-10-CM

## 2017-04-03 DIAGNOSIS — Z23 Encounter for immunization: Secondary | ICD-10-CM

## 2017-04-03 DIAGNOSIS — G43111 Migraine with aura, intractable, with status migrainosus: Secondary | ICD-10-CM | POA: Diagnosis not present

## 2017-04-03 MED ORDER — PROPRANOLOL HCL ER 80 MG PO CP24: 80.0000 mg | ORAL_CAPSULE | Freq: Every day | ORAL | 0 refills | Status: DC

## 2017-04-03 MED ORDER — TRAMADOL HCL 50 MG PO TABS: 50.0000 mg | ORAL_TABLET | Freq: Three times a day (TID) | ORAL | 0 refills | Status: DC | PRN

## 2017-04-03 MED ORDER — ONDANSETRON 8 MG PO TBDP: 8.0000 mg | ORAL_TABLET | Freq: Three times a day (TID) | ORAL | 0 refills | Status: DC | PRN

## 2017-04-03 NOTE — Assessment & Plan Note (Signed)
Increase water intake, strive for at least > 100 oz/day. Ondansetron 8mg  as needed for nausea. OTC Ibuprofen  and Acetaminophen for moderate pain and Tramadol for severe pain. Start Propranolol 80mg  once daily for first week, then increase to twice daily. Follow-up in 4 weeks. WATER, WATER, WATER!

## 2017-04-03 NOTE — Assessment & Plan Note (Signed)
Increase water and increase fruits/vegetables/lean protein. She is chronically dehydrated and nutritionally depleted. Nutritionist referral placed.

## 2017-04-03 NOTE — Progress Notes (Signed)
Subjective:    Patient ID: [name], female    DOB: 04/26/1986, 31 y.o.   MRN: [id number]  HPI: [name] over right occipital/parietal/frontal skull Pain described as "jack hammer" and over right occipital/parietal/frontal skull.  Pain is constant and rated 7/10.  She has taken 3 doses of OTC "migraine medication" and 3 doses of Ibuprofen without sx relief.  She reports blurred vision and nausea without vomiting.  She has had migraines >15 years and estimates HA > "half of the month".  She has never been seen/evaluated by Neurologist.  She only drinks Dr. Malachi Bonds and consumes foods high in nitrates (fast food, heavily processed foods) and has poor sleep due to erratic work schedule.  She also denies any exercise.    Patient Care Team    Relationship Specialty Notifications Start End  Esaw Grandchild, NP PCP - General Family Medicine  01/23/17     Patient Active Problem List   Diagnosis Date Noted  . Poor nutrition 04/03/2017  . Intractable migraine with aura with status migrainosus 04/03/2017  . Wound of right breast 03/29/2017  . Healthcare maintenance 03/29/2017  . HSV (herpes simplex virus) infection 03/29/2017  . Exposure to STD 02/08/2017  . Obesity, Class III, BMI 40-49.9 (morbid obesity) ([place]) 01/31/2017  . Bipolar 1 disorder ([place]) 01/31/2017  . Esophageal reflux   . History of gestational diabetes 06/10/2015     Past Medical History:  Diagnosis Date  . Anemia    after pregnancy  . Bipolar 1 disorder ([place])   . Blood dyscrasia    takes 11 1/2 minutes to clot - as of 01/20/16 no longer seems to have this problem  . Depression   . GERD (gastroesophageal reflux disease)    with pregnancy  . Gestational diabetes mellitus (GDM), antepartum   . Heart murmur    states she outgrew it  . History of IBS    in high school  . Kidney stones   . Migraine   . Uterine fibroid      Past Surgical History:  Procedure Laterality Date  . CESAREAN SECTION     Nov 2011  .  CESAREAN SECTION WITH BILATERAL TUBAL LIGATION Bilateral 07/26/2015   Procedure: C section ;  Surgeon: Mora Bellman, MD;  Location: [facility];  Service: Obstetrics;  Laterality: Bilateral;  . RENAL ARTERY STENT     placed and removed in 2005 - stents for kidney stones  . TOOTH EXTRACTION N/A 01/21/2016   Procedure: DENTAL RESTORATION/EXTRACTIONS;  Surgeon: Diona Browner, DDS;  Location: [facility];  Service: Oral Surgery;  Laterality: N/A;  . TUBAL LIGATION Bilateral 07/26/2015   Procedure: BILATERAL TUBAL LIGATION;  Surgeon: Mora Bellman, MD;  Location: [facility];  Service: Obstetrics;  Laterality: Bilateral;     Family History  Problem Relation Age of Onset  . Hypertension Mother   . Depression Mother   . Diabetes Father   . Hypertension Father   . Heart disease Father   . Cancer Father        foot     History  Drug Use No     History  Alcohol Use  . 0.0 oz/week    Comment: occ     History  Smoking Status  . Never Smoker  Smokeless Tobacco  . Never Used     Outpatient Encounter Prescriptions as of 04/03/2017  Medication Sig  . divalproex (DEPAKOTE) 500 MG DR tablet Take 500 mg by mouth daily.  Marland Kitchen FLUoxetine (PROZAC) 20  MG tablet Take 20 mg by mouth daily.  Marland Kitchen ibuprofen (ADVIL,MOTRIN) 200 MG tablet Take 800 mg by mouth every 6 (six) hours as needed for headache, mild pain or moderate pain.  . Vitamin D, Ergocalciferol, (DRISDOL) 50000 units CAPS capsule Take 1 capsule (50,000 Units total) by mouth every 7 (seven) days.  . ondansetron (ZOFRAN-ODT) 8 MG disintegrating tablet Take 1 tablet (8 mg total) by mouth every 8 (eight) hours as needed for nausea or vomiting.  . propranolol ER (INDERAL LA) 80 MG 24 hr capsule Take 1 capsule (80 mg total) by mouth daily. One tablet daily for week one, then increase to twice daily.  . traMADol (ULTRAM) 50 MG tablet Take 1 tablet (50 mg total) by mouth every 8 (eight) hours as needed.   No facility-administered encounter medications on file as  of 04/03/2017.     Allergies: Erythromycin; Latex; Food; and Ciprofloxacin  Body mass index is 40.8 kg/m.  Blood pressure 127/81, pulse (!) 54, height 5' 0.5" (1.537 m), weight 212 lb 6.4 oz (96.3 kg), last menstrual period 03/10/2017, not currently breastfeeding.     Review of Systems  Constitutional: Positive for activity change, appetite change and fatigue. Negative for chills, diaphoresis, fever and unexpected weight change.  HENT: Positive for congestion.   Eyes: Positive for visual disturbance.  Respiratory: Negative for cough, chest tightness, shortness of breath, wheezing and stridor.   Cardiovascular: Negative for chest pain, palpitations and leg swelling.  Gastrointestinal: Positive for nausea.  Musculoskeletal: Positive for neck stiffness.  Neurological: Positive for headaches. Negative for dizziness, tremors, facial asymmetry and weakness.       Pain over right occipital/parietal/frontal skull Pain described as "jack hammer".   Hematological: Does not bruise/bleed easily.  Psychiatric/Behavioral: Positive for dysphoric mood and sleep disturbance. The patient is nervous/anxious.        Objective:   Physical Exam  Constitutional: She is oriented to person, place, and time. She appears well-developed and well-nourished.  Non-toxic appearance. She appears ill. She appears distressed.  Squinting and speaking very quietly the entire encounter  HENT:  Head: Normocephalic and atraumatic.  Right Ear: Hearing, tympanic membrane, external ear and ear canal normal. Tympanic membrane is not erythematous and not bulging. No decreased hearing is noted.  Left Ear: Hearing, tympanic membrane, external ear and ear canal normal. Tympanic membrane is not erythematous. No decreased hearing is noted.  Nose: Mucosal edema present. Right sinus exhibits maxillary sinus tenderness and frontal sinus tenderness. Left sinus exhibits maxillary sinus tenderness and frontal sinus tenderness.   Mouth/Throat: Abnormal dentition. Dental caries present.  Eyes: Pupils are equal, round, and reactive to light. Conjunctivae are normal.  Neck: Normal range of motion. Neck supple.  Cardiovascular: Normal rate, regular rhythm, normal heart sounds and intact distal pulses.   No murmur heard. Pulmonary/Chest: Effort normal and breath sounds normal. No respiratory distress. She has no wheezes. She has no rales. She exhibits no tenderness.  Lymphadenopathy:    She has no cervical adenopathy.  Neurological: She is alert and oriented to person, place, and time. Coordination normal.  Skin: Skin is warm and dry. No rash noted. She is not diaphoretic. No erythema. No pallor.  Psychiatric: She has a normal mood and affect. Her behavior is normal. Judgment and thought content normal.          Assessment & Plan:   1. Need for influenza vaccination   2. Poor nutrition   3. Intractable migraine with aura with status migrainosus  Poor nutrition Increase water and increase fruits/vegetables/lean protein. She is chronically dehydrated and nutritionally depleted. Nutritionist referral placed.  Intractable migraine with aura with status migrainosus Increase water intake, strive for at least > 100 oz/day. Ondansetron 8mg  as needed for nausea. OTC Ibuprofen  and Acetaminophen for moderate pain and Tramadol for severe pain. Start Propranolol 80mg  once daily for first week, then increase to twice daily. Follow-up in 4 weeks. WATER, WATER, WATER!    FOLLOW-UP:  Return in about 4 weeks (around 05/01/2017) for Regular Follow Up, Evaluate Medication Effectiveness.

## 2017-04-03 NOTE — Patient Instructions (Addendum)
Migraine Headache A migraine headache is an intense, throbbing pain on one side or both sides of the head. Migraines may also cause other symptoms, such as nausea, vomiting, and sensitivity to light and noise. What are the causes? Doing or taking certain things may also trigger migraines, such as:  Alcohol.  Smoking.  Medicines, such as: ? Medicine used to treat chest pain (nitroglycerine). ? Birth control pills. ? Estrogen pills. ? Certain blood pressure medicines.  Aged cheeses, chocolate, or caffeine.  Foods or drinks that contain nitrates, glutamate, aspartame, or tyramine.  Physical activity.  Other things that may trigger a migraine include:  Menstruation.  Pregnancy.  Hunger.  Stress, lack of sleep, too much sleep, or fatigue.  Weather changes.  What increases the risk? The following factors may make you more likely to experience migraine headaches:  Age. Risk increases with age.  Family history of migraine headaches.  Being Caucasian.  Depression and anxiety.  Obesity.  Being a woman.  Having a hole in the heart (patent foramen ovale) or other heart problems.  What are the signs or symptoms? The main symptom of this condition is pulsating or throbbing pain. Pain may:  Happen in any area of the head, such as on one side or both sides.  Interfere with daily activities.  Get worse with physical activity.  Get worse with exposure to bright lights or loud noises.  Other symptoms may include:  Nausea.  Vomiting.  Dizziness.  General sensitivity to bright lights, loud noises, or smells.  Before you get a migraine, you may get warning signs that a migraine is developing (aura). An aura may include:  Seeing flashing lights or having blind spots.  Seeing bright spots, halos, or zigzag lines.  Having tunnel vision or blurred vision.  Having numbness or a tingling feeling.  Having trouble talking.  Having muscle weakness.  How is this  diagnosed? A migraine headache can be diagnosed based on:  Your symptoms.  A physical exam.  Tests, such as CT scan or MRI of the head. These imaging tests can help rule out other causes of headaches.  Taking fluid from the spine (lumbar puncture) and analyzing it (cerebrospinal fluid analysis, or CSF analysis).  How is this treated? A migraine headache is usually treated with medicines that:  Relieve pain.  Relieve nausea.  Prevent migraines from coming back.  Treatment may also include:  Acupuncture.  Lifestyle changes like avoiding foods that trigger migraines.  Follow these instructions at home: Medicines  Take over-the-counter and prescription medicines only as told by your health care provider.  Do not drive or use heavy machinery while taking prescription pain medicine.  To prevent or treat constipation while you are taking prescription pain medicine, your health care provider may recommend that you: ? Drink enough fluid to keep your urine clear or pale yellow. ? Take over-the-counter or prescription medicines. ? Eat foods that are high in fiber, such as fresh fruits and vegetables, whole grains, and beans. ? Limit foods that are high in fat and processed sugars, such as fried and sweet foods. Lifestyle  Avoid alcohol use.  Do not use any products that contain nicotine or tobacco, such as cigarettes and e-cigarettes. If you need help quitting, ask your health care provider.  Get at least 8 hours of sleep every night.  Limit your stress. General instructions   Keep a journal to find out what may trigger your migraine headaches. For example, write down: ? What you eat and   drink. ? How much sleep you get. ? Any change to your diet or medicines.  If you have a migraine: ? Avoid things that make your symptoms worse, such as bright lights. ? It may help to lie down in a dark, quiet room. ? Do not drive or use heavy machinery. ? Ask your health care provider  what activities are safe for you while you are experiencing symptoms.  Keep all follow-up visits as told by your health care provider. This is important. Contact a health care provider if:  You develop symptoms that are different or more severe than your usual migraine symptoms. Get help right away if:  Your migraine becomes severe.  You have a fever.  You have a stiff neck.  You have vision loss.  Your muscles feel weak or like you cannot control them.  You start to lose your balance often.  You develop trouble walking.  You faint. This information is not intended to replace advice given to you by your health care provider. Make sure you discuss any questions you have with your health care provider. Document Released: 06/05/2005 Document Revised: 12/24/2015 Document Reviewed: 11/22/2015 Elsevier Interactive Patient Education  2017 [place].   Increase water intake, strive for at least > 100 oz/day. Increase fruits/vegetables/lean protein.  Nutrition referral placed. Ondansetron 8mg  as needed for nausea. OTC Ibuprofen  and Acetaminophen for moderate pain and Tramadol for severe pain. Start Propranolol 80mg  once daily for first week, then increase to twice daily. Follow-up in 4 weeks. WATER, WATER, WATER!

## 2017-04-09 ENCOUNTER — Inpatient Hospital Stay: Admission: RE | Admit: 2017-04-09 | Payer: Self-pay | Source: Ambulatory Visit

## 2017-04-19 ENCOUNTER — Emergency Department (HOSPITAL_COMMUNITY)
Admission: EM | Admit: 2017-04-19 | Discharge: 2017-04-20 | Disposition: A | Discharge status: Home / Self Care | Payer: 59 | Attending: Emergency Medicine | Admitting: Emergency Medicine

## 2017-04-19 ENCOUNTER — Encounter (HOSPITAL_COMMUNITY): Payer: Self-pay | Admitting: Emergency Medicine

## 2017-04-19 DIAGNOSIS — F319 Bipolar disorder, unspecified: Secondary | ICD-10-CM | POA: Diagnosis not present

## 2017-04-19 DIAGNOSIS — G43809 Other migraine, not intractable, without status migrainosus: Secondary | ICD-10-CM | POA: Diagnosis not present

## 2017-04-19 DIAGNOSIS — Z91040 Latex allergy status: Secondary | ICD-10-CM | POA: Diagnosis not present

## 2017-04-19 DIAGNOSIS — G43909 Migraine, unspecified, not intractable, without status migrainosus: Secondary | ICD-10-CM | POA: Diagnosis not present

## 2017-04-19 DIAGNOSIS — Z79899 Other long term (current) drug therapy: Secondary | ICD-10-CM | POA: Diagnosis not present

## 2017-04-19 MED ORDER — DIPHENHYDRAMINE HCL 50 MG/ML IJ SOLN
12.5000 mg | Freq: Once | INTRAMUSCULAR | Status: AC
  Administered 2017-04-19: 12.5 mg via INTRAVENOUS
  Filled 2017-04-19: qty 1

## 2017-04-19 MED ORDER — PROCHLORPERAZINE EDISYLATE 5 MG/ML IJ SOLN
10.0000 mg | Freq: Once | INTRAMUSCULAR | Status: AC
  Administered 2017-04-19: 10 mg via INTRAVENOUS
  Filled 2017-04-19: qty 2

## 2017-04-19 MED ORDER — SODIUM CHLORIDE 0.9 % IV BOLUS (SEPSIS)
1000.0000 mL | Freq: Once | INTRAVENOUS | Status: AC
  Administered 2017-04-19: 1000 mL via INTRAVENOUS

## 2017-04-19 MED ORDER — IBUPROFEN 400 MG PO TABS
ORAL_TABLET | ORAL | Status: AC
  Filled 2017-04-19: qty 1

## 2017-04-19 MED ORDER — IBUPROFEN 400 MG PO TABS
400.0000 mg | ORAL_TABLET | Freq: Once | ORAL | Status: AC | PRN
  Administered 2017-04-19: 400 mg via ORAL

## 2017-04-19 NOTE — ED Notes (Signed)
IV team at bedside 

## 2017-04-19 NOTE — ED Notes (Signed)
Pt reports migraine since Tuesday, also reports photosensitivity and nausea. No neuro deficits, hx migraines.

## 2017-04-19 NOTE — ED Notes (Signed)
IV attempted  

## 2017-04-19 NOTE — ED Provider Notes (Signed)
[facility] EMER[facility]ENC[facility] DEPARTMENT Provider Note   CSN: [id number] Arrival date & time: 04/19/17  1814     History   Chief Complaint Chief Complaint  Patient presents with  . Migraine    HPI [name] is a 31 y.o. female with a past medical history of migraines, bipolar disorder, depression, presents to ED for evaluation of migraine for the past 3 days.  States that the pain is located throughout her entire head and associated with nausea, photophobia and phonophobia.  States this feels similar to her prior migraines.  She has tried Tylenol, ibuprofen, tramadol with mild relief in her symptoms.  She denies any head injury, vision changes, lower extremity numbness, vomiting, chest pain, trouble breathing, neck pain, fevers.  HPI  Past Medical History:  Diagnosis Date  . Anemia    after pregnancy  . Bipolar 1 disorder ([place])   . Blood dyscrasia    takes 11 1/2 minutes to clot - as of 01/20/16 no longer seems to have this problem  . Depression   . GERD (gastroesophageal reflux disease)    with pregnancy  . Gestational diabetes mellitus (GDM), antepartum   . Heart murmur    states she outgrew it  . History of IBS    in high school  . Kidney stones   . Migraine   . Uterine fibroid     Patient Active Problem List   Diagnosis Date Noted  . Poor nutrition 04/03/2017  . Intractable migraine with aura with status migrainosus 04/03/2017  . Wound of right breast 03/29/2017  . Healthcare maintenance 03/29/2017  . HSV (herpes simplex virus) infection 03/29/2017  . Exposure to STD 02/08/2017  . Obesity, Class III, BMI 40-49.9 (morbid obesity) ([place]) 01/31/2017  . Bipolar 1 disorder ([place]) 01/31/2017  . Esophageal reflux   . History of gestational diabetes 06/10/2015    Past Surgical History:  Procedure Laterality Date  . CESAREAN SECTION     Nov 2011  . CESAREAN SECTION WITH BILATERAL TUBAL LIGATION Bilateral 07/26/2015   Procedure: C section ;  Surgeon:  Mora Bellman, MD;  Location: [facility];  Service: Obstetrics;  Laterality: Bilateral;  . RENAL ARTERY STENT     placed and removed in 2005 - stents for kidney stones  . TOOTH EXTRACTION N/A 01/21/2016   Procedure: DENTAL RESTORATION/EXTRACTIONS;  Surgeon: Diona Browner, DDS;  Location: [facility];  Service: Oral Surgery;  Laterality: N/A;  . TUBAL LIGATION Bilateral 07/26/2015   Procedure: BILATERAL TUBAL LIGATION;  Surgeon: Mora Bellman, MD;  Location: [facility];  Service: Obstetrics;  Laterality: Bilateral;    OB History    Gravida Para Term Preterm AB Living   3 2 2   1 2    SAB TAB Ectopic Multiple Live Births   1     0 2       Home Medications    Prior to Admission medications   Medication Sig Start Date End Date Taking? Authorizing Provider  divalproex (DEPAKOTE) 500 MG DR tablet Take 500 mg by mouth daily.   Yes [provider]  FLUoxetine (PROZAC) 20 MG tablet Take 20 mg by mouth daily.   Yes [provider]  ibuprofen (ADVIL,MOTRIN) 200 MG tablet Take 800 mg by mouth every 6 (six) hours as needed for headache, mild pain or moderate pain.   Yes [provider]  ondansetron (ZOFRAN-ODT) 8 MG disintegrating tablet Take 1 tablet (8 mg total) by mouth every 8 (eight) hours as needed for  nausea or vomiting. 04/03/17  Yes Danford, Valetta Fuller D, NP  propranolol ER (INDERAL LA) 80 MG 24 hr capsule Take 1 capsule (80 mg total) by mouth daily. One tablet daily for week one, then increase to twice daily. 04/03/17  Yes Danford, Valetta Fuller D, NP  traMADol (ULTRAM) 50 MG tablet Take 1 tablet (50 mg total) by mouth every 8 (eight) hours as needed. 04/03/17  Yes Danford, Valetta Fuller D, NP  Vitamin D, Ergocalciferol, (DRISDOL) 50000 units CAPS capsule Take 1 capsule (50,000 Units total) by mouth every 7 (seven) days. 02/07/17  Yes Danford, Berna Spare, NP    Family History Family History  Problem Relation Age of Onset  . Hypertension Mother   . Depression Mother   . Diabetes Father   . Hypertension  Father   . Heart disease Father   . Cancer Father        foot    Social History Social History  Substance Use Topics  . Smoking status: Never Smoker  . Smokeless tobacco: Never Used  . Alcohol use 0.0 oz/week     Comment: occ     Allergies   Erythromycin; Latex; Food; and Ciprofloxacin   Review of Systems Review of Systems  Constitutional: Negative for appetite change, chills and fever.  HENT: Negative for ear pain, rhinorrhea, sneezing and sore throat.   Eyes: Negative for photophobia and visual disturbance.  Respiratory: Negative for cough, chest tightness, shortness of breath and wheezing.   Cardiovascular: Negative for chest pain and palpitations.  Gastrointestinal: Positive for nausea. Negative for abdominal pain, blood in stool, constipation, diarrhea and vomiting.  Genitourinary: Negative for dysuria, hematuria and urgency.  Musculoskeletal: Negative for myalgias.  Skin: Negative for rash.  Neurological: Positive for headaches. Negative for dizziness, weakness and light-headedness.     Physical Exam Updated Vital Signs BP 131/81   Pulse 65   Temp 98.1 F (36.7 C) (Oral)   Resp 14   Ht 5\' 1"  (1.549 m)   Wt 97.5 kg (215 lb)   LMP 04/12/2017 (Approximate)   SpO2 98%   BMI 40.62 kg/m   Physical Exam  Constitutional: She is oriented to person, place, and time. She appears well-developed and well-nourished. No distress.  HENT:  Head: Normocephalic and atraumatic.  Nose: Nose normal.  Eyes: Pupils are equal, round, and reactive to light. Conjunctivae and EOM are normal. Right eye exhibits no discharge. Left eye exhibits no discharge. No scleral icterus.  Neck: Normal range of motion. Neck supple.  Cardiovascular: Normal rate, regular rhythm, normal heart sounds and intact distal pulses.  Exam reveals no gallop and no friction rub.   No murmur heard. Pulmonary/Chest: Effort normal and breath sounds normal. No respiratory distress.  Abdominal: Soft. Bowel  sounds are normal. She exhibits no distension. There is no tenderness. There is no guarding.  Musculoskeletal: Normal range of motion. She exhibits no edema.  Neurological: She is alert and oriented to person, place, and time. No cranial nerve deficit or sensory deficit. She exhibits normal muscle tone. Coordination normal.  Pupils reactive. No facial asymmetry noted. Cranial nerves appear grossly intact. Sensation intact to light touch on face, BUE and BLE. Strength 5/5 in BUE and BLE. Normal finger to nose coordination.  Skin: Skin is warm and dry. No rash noted.  Psychiatric: She has a normal mood and affect.  Nursing note and vitals reviewed.     ED Treatments / Results  Labs (all labs ordered are listed, but only abnormal results are displayed) Labs  Reviewed - No data to display  EKG  EKG Interpretation None       Radiology No results found.  Procedures Procedures (including critical care time)  Medications Ordered in ED Medications  ibuprofen (ADVIL,MOTRIN) tablet 400 mg (400 mg Oral Given 04/19/17 1919)  prochlorperazine (COMPAZINE) injection 10 mg (10 mg Intravenous Given 04/19/17 2321)  diphenhydrAMINE (BENADRYL) injection 12.5 mg (12.5 mg Intravenous Given 04/19/17 2320)  sodium chloride 0.9 % bolus 1,000 mL (1,000 mLs Intravenous New Bag/Given 04/19/17 2320)     Initial Impression / Assessment and Plan / ED Course  I have reviewed the triage vital signs and the nursing notes.  Pertinent labs & imaging results that were available during my care of the patient were reviewed by me and considered in my medical decision making (see chart for details).     Patient presents to ED for evaluation of migraine for the past 3 days.  She does have a history of migraines.  She has tried her home medications including Tylenol, ibuprofen, tramadol with mild relief in her symptoms.  She states that this feels similar to her prior migraines she has associated nausea, photophobia and  phonophobia.  Physical exam she is nontoxic-appearing and in no acute distress.  She has no deficits on neurological exam.  She is not tachycardic or tachypneic.  She is afebrile with no history of fever.  No neck tenderness to palpation or changes in range of motion of neck.  Will give migraine cocktail, fluids and reassess.  Patient reports complete resolution of her symptoms with migraine cocktail.  Will advise her to continue her home medications as previously prescribed and to follow-up with her PCP for any referral to neurology.  Patient appears stable for discharge at this time.  Strict return precautions given.  Final Clinical Impressions(s) / ED Diagnoses   Final diagnoses:  Other migraine without status migrainosus, not intractable    New Prescriptions New Prescriptions   No medications on file     Delia Heady, PA-C 04/20/17 Geronimo, [place], DO 04/20/17 1756

## 2017-04-20 NOTE — Discharge Instructions (Signed)
Please read attached information regarding your condition. To continue your home medications as previously prescribed. Follow-up with your PCP for further evaluation. Return to ED for worsening headache, head injuries, falls, lightheadedness, loss of consciousness, vision changes or neck pain.

## 2017-04-26 ENCOUNTER — Ambulatory Visit: Payer: 59 | Admitting: Adult Health

## 2017-04-26 ENCOUNTER — Encounter: Payer: Self-pay | Admitting: Adult Health

## 2017-04-26 VITALS — BP 127/77 | HR 79 | Ht 60.5 in | Wt 212.4 lb

## 2017-04-26 DIAGNOSIS — J310 Chronic rhinitis: Secondary | ICD-10-CM | POA: Diagnosis not present

## 2017-04-26 DIAGNOSIS — G43111 Migraine with aura, intractable, with status migrainosus: Secondary | ICD-10-CM | POA: Diagnosis not present

## 2017-04-26 DIAGNOSIS — Z23 Encounter for immunization: Secondary | ICD-10-CM

## 2017-04-26 MED ORDER — ONDANSETRON 8 MG PO TBDP: 8.0000 mg | ORAL_TABLET | Freq: Three times a day (TID) | ORAL | 0 refills | Status: DC | PRN

## 2017-04-26 MED ORDER — MONTELUKAST SODIUM 10 MG PO TABS: 10.0000 mg | ORAL_TABLET | Freq: Every day | ORAL | 3 refills | Status: DC

## 2017-04-26 NOTE — Patient Instructions (Signed)
Migraine Headache A migraine headache is an intense, throbbing pain on one side or both sides of the head. Migraines may also cause other symptoms, such as nausea, vomiting, and sensitivity to light and noise. What are the causes? Doing or taking certain things may also trigger migraines, such as:  Alcohol.  Smoking.  Medicines, such as: ? Medicine used to treat chest pain (nitroglycerine). ? Birth control pills. ? Estrogen pills. ? Certain blood pressure medicines.  Aged cheeses, chocolate, or caffeine.  Foods or drinks that contain nitrates, glutamate, aspartame, or tyramine.  Physical activity.  Other things that may trigger a migraine include:  Menstruation.  Pregnancy.  Hunger.  Stress, lack of sleep, too much sleep, or fatigue.  Weather changes.  What increases the risk? The following factors may make you more likely to experience migraine headaches:  Age. Risk increases with age.  Family history of migraine headaches.  Being Caucasian.  Depression and anxiety.  Obesity.  Being a woman.  Having a hole in the heart (patent foramen ovale) or other heart problems.  What are the signs or symptoms? The main symptom of this condition is pulsating or throbbing pain. Pain may:  Happen in any area of the head, such as on one side or both sides.  Interfere with daily activities.  Get worse with physical activity.  Get worse with exposure to bright lights or loud noises.  Other symptoms may include:  Nausea.  Vomiting.  Dizziness.  General sensitivity to bright lights, loud noises, or smells.  Before you get a migraine, you may get warning signs that a migraine is developing (aura). An aura may include:  Seeing flashing lights or having blind spots.  Seeing bright spots, halos, or zigzag lines.  Having tunnel vision or blurred vision.  Having numbness or a tingling feeling.  Having trouble talking.  Having muscle weakness.  How is this  diagnosed? A migraine headache can be diagnosed based on:  Your symptoms.  A physical exam.  Tests, such as CT scan or MRI of the head. These imaging tests can help rule out other causes of headaches.  Taking fluid from the spine (lumbar puncture) and analyzing it (cerebrospinal fluid analysis, or CSF analysis).  How is this treated? A migraine headache is usually treated with medicines that:  Relieve pain.  Relieve nausea.  Prevent migraines from coming back.  Treatment may also include:  Acupuncture.  Lifestyle changes like avoiding foods that trigger migraines.  Follow these instructions at home: Medicines  Take over-the-counter and prescription medicines only as told by your health care provider.  Do not drive or use heavy machinery while taking prescription pain medicine.  To prevent or treat constipation while you are taking prescription pain medicine, your health care provider may recommend that you: ? Drink enough fluid to keep your urine clear or pale yellow. ? Take over-the-counter or prescription medicines. ? Eat foods that are high in fiber, such as fresh fruits and vegetables, whole grains, and beans. ? Limit foods that are high in fat and processed sugars, such as fried and sweet foods. Lifestyle  Avoid alcohol use.  Do not use any products that contain nicotine or tobacco, such as cigarettes and e-cigarettes. If you need help quitting, ask your health care provider.  Get at least 8 hours of sleep every night.  Limit your stress. General instructions   Keep a journal to find out what may trigger your migraine headaches. For example, write down: ? What you eat and   drink. ? How much sleep you get. ? Any change to your diet or medicines.  If you have a migraine: ? Avoid things that make your symptoms worse, such as bright lights. ? It may help to lie down in a dark, quiet room. ? Do not drive or use heavy machinery. ? Ask your health care provider  what activities are safe for you while you are experiencing symptoms.  Keep all follow-up visits as told by your health care provider. This is important. Contact a health care provider if:  You develop symptoms that are different or more severe than your usual migraine symptoms. Get help right away if:  Your migraine becomes severe.  You have a fever.  You have a stiff neck.  You have vision loss.  Your muscles feel weak or like you cannot control them.  You start to lose your balance often.  You develop trouble walking.  You faint. This information is not intended to replace advice given to you by your health care provider. Make sure you discuss any questions you have with your health care provider. Document Released: 06/05/2005 Document Revised: 12/24/2015 Document Reviewed: 11/22/2015 Elsevier Interactive Patient Education  2017 [place].  Increase water intake, strive for at least 100 ounces/day. Eat heart healthy diet-please schedule appt with nutritionist. Continue to alterate OTC Acetaminophen and Ibuprofen, please reserve Tramadol for severe pain. Neurology referral and imaging orders placed. Follow-up as needed. FEEL BETTER!

## 2017-04-26 NOTE — Progress Notes (Addendum)
Subjective:    Patient ID: [name], female    DOB: 08-23-1985, 31 y.o.   MRN: [id number]  HPI: 04/03/2017 OV Notes: [name] over right occipital/parietal/frontal skull Pain described as "jack hammer" and over right occipital/parietal/frontal skull.  Pain is constant and rated 7/10.  She has taken 3 doses of OTC "migraine medication" and 3 doses of Ibuprofen without sx relief.  She reports blurred vision and nausea without vomiting.  She has had migraines >15 years and estimates HA > "half of the month".  She has never been seen/evaluated by Neurologist.  She only drinks Dr. Malachi Bonds and consumes foods high in nitrates (fast food, heavily processed foods) and has poor sleep due to erratic work schedule.  She also denies any exercise.     Today's OV Notes 04/26/2017: [name] is here for f/u: continued HAs with light/sound sensitivity, and nausea. She went to ED for HA on 11/1/201 for intractable migraine- treated with narcotics and d/c'd stable to home. She continues to have a constant HA and has been treating with OTC Acetaminophen/Ibuprofen and PRN Tramadol.  She is also suffering from if seasonal allergies and has been using OTC Zyrtec for years. She reports only drinking Dr. Malachi Bonds and has not made appt with nutritionist due to change in work schedule. She abruptly stopped taking her antidepressants approx 6 weeks ago- which may have contributed to some of the increase of intensity/frequency HA pain. She never filled Inderal rx due to cost- rx coupons provided today. She denies CP/dyspnea/palpitations. She reports sig family hx of aneurysm's.    Patient Care Team    Relationship Specialty Notifications Start End  Esaw Grandchild, NP PCP - General Family Medicine  01/23/17     Patient Active Problem List   Diagnosis Date Noted  . Rhinitis, chronic 04/26/2017  . Poor nutrition 04/03/2017  . Intractable migraine with aura with status migrainosus 04/03/2017  . Wound of right  breast 03/29/2017  . Healthcare maintenance 03/29/2017  . HSV (herpes simplex virus) infection 03/29/2017  . Exposure to STD 02/08/2017  . Obesity, Class III, BMI 40-49.9 (morbid obesity) ([place]) 01/31/2017  . Bipolar 1 disorder ([place]) 01/31/2017  . Esophageal reflux   . History of gestational diabetes 06/10/2015     Past Medical History:  Diagnosis Date  . Anemia    after pregnancy  . Bipolar 1 disorder ([place])   . Blood dyscrasia    takes 11 1/2 minutes to clot - as of 01/20/16 no longer seems to have this problem  . Depression   . GERD (gastroesophageal reflux disease)    with pregnancy  . Gestational diabetes mellitus (GDM), antepartum   . Heart murmur    states she outgrew it  . History of IBS    in high school  . Kidney stones   . Migraine   . Uterine fibroid      Past Surgical History:  Procedure Laterality Date  . CESAREAN SECTION     Nov 2011  . RENAL ARTERY STENT     placed and removed in 2005 - stents for kidney stones     Family History  Problem Relation Age of Onset  . Hypertension Mother   . Depression Mother   . Diabetes Father   . Hypertension Father   . Heart disease Father   . Cancer Father        foot     Social History   Substance and Sexual Activity  Drug Use No  Social History   Substance and Sexual Activity  Alcohol Use Yes  . Alcohol/week: 0.0 oz   Comment: occ     Social History   Tobacco Use  Smoking Status Never Smoker  Smokeless Tobacco Never Used     Outpatient Encounter Medications as of 04/26/2017  Medication Sig Note  . ibuprofen (ADVIL,MOTRIN) 200 MG tablet Take 800 mg by mouth every 6 (six) hours as needed for headache, mild pain or moderate pain.   . traMADol (ULTRAM) 50 MG tablet Take 1 tablet (50 mg total) by mouth every 8 (eight) hours as needed.   . Vitamin D, Ergocalciferol, (DRISDOL) 50000 units CAPS capsule Take 1 capsule (50,000 Units total) by mouth every 7 (seven) days.   . montelukast  (SINGULAIR) 10 MG tablet Take 1 tablet (10 mg total) at bedtime by mouth.   . ondansetron (ZOFRAN-ODT) 8 MG disintegrating tablet Take 1 tablet (8 mg total) every 8 (eight) hours as needed by mouth for nausea or vomiting.   . [DISCONTINUED] divalproex (DEPAKOTE) 500 MG DR tablet Take 500 mg by mouth daily.   . [DISCONTINUED] FLUoxetine (PROZAC) 20 MG tablet Take 20 mg by mouth daily.   . [DISCONTINUED] ondansetron (ZOFRAN-ODT) 8 MG disintegrating tablet Take 1 tablet (8 mg total) by mouth every 8 (eight) hours as needed for nausea or vomiting.   . [DISCONTINUED] propranolol ER (INDERAL LA) 80 MG 24 hr capsule Take 1 capsule (80 mg total) by mouth daily. One tablet daily for week one, then increase to twice daily. 04/19/2017: Has not picked up from pharmacy yet   No facility-administered encounter medications on file as of 04/26/2017.     Allergies: Erythromycin; Latex; Food; and Ciprofloxacin  Body mass index is 40.8 kg/m.  Blood pressure 127/77, pulse 79, height 5' 0.5" (1.537 m), weight 212 lb 6.4 oz (96.3 kg), last menstrual period 04/12/2017, not currently breastfeeding.     Review of Systems  Constitutional: Positive for activity change, appetite change and fatigue. Negative for chills, diaphoresis, fever and unexpected weight change.  HENT: Positive for congestion.   Eyes: Positive for visual disturbance.  Respiratory: Negative for cough, chest tightness, shortness of breath, wheezing and stridor.   Cardiovascular: Negative for chest pain, palpitations and leg swelling.  Gastrointestinal: Positive for nausea.  Musculoskeletal: Positive for neck stiffness.  Neurological: Positive for headaches. Negative for dizziness, tremors, facial asymmetry and weakness.       Pain over right occipital/parietal/frontal skull Pain described as "jack hammer".   Hematological: Does not bruise/bleed easily.  Psychiatric/Behavioral: Positive for dysphoric mood and sleep disturbance. The patient is  nervous/anxious.        Objective:   Physical Exam  Constitutional: She is oriented to person, place, and time. She appears well-developed and well-nourished.  Non-toxic appearance. She appears ill. She appears distressed.  Squinting and speaking very quietly the entire encounter  HENT:  Head: Normocephalic and atraumatic.  Right Ear: Hearing, tympanic membrane, external ear and ear canal normal. Tympanic membrane is not erythematous and not bulging. No decreased hearing is noted.  Left Ear: Hearing, tympanic membrane, external ear and ear canal normal. Tympanic membrane is not erythematous. No decreased hearing is noted.  Nose: Mucosal edema present. Right sinus exhibits maxillary sinus tenderness and frontal sinus tenderness. Left sinus exhibits maxillary sinus tenderness and frontal sinus tenderness.  Mouth/Throat: Abnormal dentition. Dental caries present.  Eyes: Conjunctivae are normal. Pupils are equal, round, and reactive to light.  Neck: Normal range of motion. Neck supple.  Cardiovascular: Normal rate, regular rhythm, normal heart sounds and intact distal pulses.  No murmur heard. Pulmonary/Chest: Effort normal and breath sounds normal. No respiratory distress. She has no wheezes. She has no rales. She exhibits no tenderness.  Lymphadenopathy:    She has no cervical adenopathy.  Neurological: She is alert and oriented to person, place, and time. She displays normal reflexes. No cranial nerve deficit. She exhibits normal muscle tone. Coordination normal.  Skin: Skin is warm and dry. No rash noted. She is not diaphoretic. No erythema. No pallor.  Psychiatric: She has a normal mood and affect. Her behavior is normal. Judgment and thought content normal.          Assessment & Plan:   1. Need for influenza vaccination   2. Intractable migraine with aura with status migrainosus   3. Rhinitis, chronic     Intractable migraine with aura with status migrainosus Increase water  intake, strive for at least 100 ounces/day. Eat heart healthy diet-please schedule appt with nutritionist. Continue to alterate OTC Acetaminophen and Ibuprofen, please reserve Tramadol for severe pain. Neurology referral and MRI WO contrast orders placed. Follow-up as needed.  Rhinitis, chronic Increase water, decrease exposure to tobacco smoke/other non allergens that trigger sx's. Started on Montelukast 10mg  daily.    FOLLOW-UP:  Return if symptoms worsen or fail to improve.

## 2017-04-26 NOTE — Assessment & Plan Note (Addendum)
Increase water intake, strive for at least 100 ounces/day. Eat heart healthy diet-please schedule appt with nutritionist. Continue to alterate OTC Acetaminophen and Ibuprofen, please reserve Tramadol for severe pain. Neurology referral and MRI WO contrast orders placed. Follow-up as needed.

## 2017-04-26 NOTE — Addendum Note (Signed)
Addended by: Mina Marble D on: 04/26/2017 11:58 AM   Modules accepted: Orders

## 2017-04-26 NOTE — Assessment & Plan Note (Signed)
Increase water, decrease exposure to tobacco smoke/other non allergens that trigger sx's. Started on Montelukast 10mg  daily.

## 2017-05-01 ENCOUNTER — Other Ambulatory Visit: Payer: 59

## 2017-05-07 ENCOUNTER — Ambulatory Visit: Payer: 59 | Admitting: Adult Health

## 2017-05-08 ENCOUNTER — Other Ambulatory Visit: Payer: 59

## 2017-05-14 NOTE — Progress Notes (Deleted)
Subjective:    Patient ID: [name], female    DOB: September 23, 1985, 31 y.o.   MRN: [id number]  HPI: 04/03/2017 OV Notes: [name] over right occipital/parietal/frontal skull Pain described as "jack hammer" and over right occipital/parietal/frontal skull.  Pain is constant and rated 7/10.  She has taken 3 doses of OTC "migraine medication" and 3 doses of Ibuprofen without sx relief.  She reports blurred vision and nausea without vomiting.  She has had migraines >15 years and estimates HA > "half of the month".  She has never been seen/evaluated by Neurologist.  She only drinks Dr. Malachi Bonds and consumes foods high in nitrates (fast food, heavily processed foods) and has poor sleep due to erratic work schedule.  She also denies any exercise.      04/26/2017 OV: [name] is here for f/u: continued HAs with light/sound sensitivity, and nausea. She went to ED for HA on 11/1/201 for intractable migraine- treated with narcotics and d/c'd stable to home. She continues to have a constant HA and has been treating with OTC Acetaminophen/Ibuprofen and PRN Tramadol.  She is also suffering from if seasonal allergies and has been using OTC Zyrtec for years. She reports only drinking Dr. Malachi Bonds and has not made appt with nutritionist due to change in work schedule. She abruptly stopped taking her antidepressants approx 6 weeks ago- which may have contributed to some of the increase of intensity/frequency HA pain. She never filled Inderal rx due to cost- rx coupons provided today. She denies CP/dyspnea/palpitations. She reports sig family hx of aneurysm's.  05/15/17 OV:  [name] is her for f/u  Patient Care Team    Relationship Specialty Notifications Start End  Esaw Grandchild, NP PCP - General Family Medicine  01/23/17     Patient Active Problem List   Diagnosis Date Noted  . Rhinitis, chronic 04/26/2017  . Poor nutrition 04/03/2017  . Intractable migraine with aura with status migrainosus  04/03/2017  . Wound of right breast 03/29/2017  . Healthcare maintenance 03/29/2017  . HSV (herpes simplex virus) infection 03/29/2017  . Exposure to STD 02/08/2017  . Obesity, Class III, BMI 40-49.9 (morbid obesity) ([place]) 01/31/2017  . Bipolar 1 disorder ([place]) 01/31/2017  . Esophageal reflux   . History of gestational diabetes 06/10/2015     Past Medical History:  Diagnosis Date  . Anemia    after pregnancy  . Bipolar 1 disorder ([place])   . Blood dyscrasia    takes 11 1/2 minutes to clot - as of 01/20/16 no longer seems to have this problem  . Depression   . GERD (gastroesophageal reflux disease)    with pregnancy  . Gestational diabetes mellitus (GDM), antepartum   . Heart murmur    states she outgrew it  . History of IBS    in high school  . Kidney stones   . Migraine   . Uterine fibroid      Past Surgical History:  Procedure Laterality Date  . CESAREAN SECTION     Nov 2011  . CESAREAN SECTION WITH BILATERAL TUBAL LIGATION Bilateral 07/26/2015   Procedure: C section ;  Surgeon: Mora Bellman, MD;  Location: [facility];  Service: Obstetrics;  Laterality: Bilateral;  . RENAL ARTERY STENT     placed and removed in 2005 - stents for kidney stones  . TOOTH EXTRACTION N/A 01/21/2016   Procedure: DENTAL RESTORATION/EXTRACTIONS;  Surgeon: Diona Browner, DDS;  Location: [facility];  Service: Oral Surgery;  Laterality: N/A;  .  TUBAL LIGATION Bilateral 07/26/2015   Procedure: BILATERAL TUBAL LIGATION;  Surgeon: Mora Bellman, MD;  Location: [facility];  Service: Obstetrics;  Laterality: Bilateral;     Family History  Problem Relation Age of Onset  . Hypertension Mother   . Depression Mother   . Diabetes Father   . Hypertension Father   . Heart disease Father   . Cancer Father        foot     Social History   Substance and Sexual Activity  Drug Use No     Social History   Substance and Sexual Activity  Alcohol Use Yes  . Alcohol/week: 0.0 oz   Comment: occ     Social  History   Tobacco Use  Smoking Status Never Smoker  Smokeless Tobacco Never Used     Outpatient Encounter Medications as of 05/15/2017  Medication Sig  . ibuprofen (ADVIL,MOTRIN) 200 MG tablet Take 800 mg by mouth every 6 (six) hours as needed for headache, mild pain or moderate pain.  . montelukast (SINGULAIR) 10 MG tablet Take 1 tablet (10 mg total) at bedtime by mouth.  . ondansetron (ZOFRAN-ODT) 8 MG disintegrating tablet Take 1 tablet (8 mg total) every 8 (eight) hours as needed by mouth for nausea or vomiting.  . traMADol (ULTRAM) 50 MG tablet Take 1 tablet (50 mg total) by mouth every 8 (eight) hours as needed.  . Vitamin D, Ergocalciferol, (DRISDOL) 50000 units CAPS capsule Take 1 capsule (50,000 Units total) by mouth every 7 (seven) days.   No facility-administered encounter medications on file as of 05/15/2017.     Allergies: Erythromycin; Latex; Food; and Ciprofloxacin  There is no height or weight on file to calculate BMI.  not currently breastfeeding.     Review of Systems  Constitutional: Positive for activity change, appetite change and fatigue. Negative for chills, diaphoresis, fever and unexpected weight change.  HENT: Positive for congestion.   Eyes: Positive for visual disturbance.  Respiratory: Negative for cough, chest tightness, shortness of breath, wheezing and stridor.   Cardiovascular: Negative for chest pain, palpitations and leg swelling.  Gastrointestinal: Positive for nausea.  Musculoskeletal: Positive for neck stiffness.  Neurological: Positive for headaches. Negative for dizziness, tremors, facial asymmetry and weakness.       Pain over right occipital/parietal/frontal skull Pain described as "jack hammer".   Hematological: Does not bruise/bleed easily.  Psychiatric/Behavioral: Positive for dysphoric mood and sleep disturbance. The patient is nervous/anxious.        Objective:   Physical Exam  Constitutional: She is oriented to person,  place, and time. She appears well-developed and well-nourished.  Non-toxic appearance. She appears ill. She appears distressed.  Squinting and speaking very quietly the entire encounter  HENT:  Head: Normocephalic and atraumatic.  Right Ear: Hearing, tympanic membrane, external ear and ear canal normal. Tympanic membrane is not erythematous and not bulging. No decreased hearing is noted.  Left Ear: Hearing, tympanic membrane, external ear and ear canal normal. Tympanic membrane is not erythematous. No decreased hearing is noted.  Nose: Mucosal edema present. Right sinus exhibits maxillary sinus tenderness and frontal sinus tenderness. Left sinus exhibits maxillary sinus tenderness and frontal sinus tenderness.  Mouth/Throat: Abnormal dentition. Dental caries present.  Eyes: Conjunctivae are normal. Pupils are equal, round, and reactive to light.  Neck: Normal range of motion. Neck supple.  Cardiovascular: Normal rate, regular rhythm, normal heart sounds and intact distal pulses.  No murmur heard. Pulmonary/Chest: Effort normal and breath sounds normal. No respiratory distress.  She has no wheezes. She has no rales. She exhibits no tenderness.  Lymphadenopathy:    She has no cervical adenopathy.  Neurological: She is alert and oriented to person, place, and time. She displays normal reflexes. No cranial nerve deficit. She exhibits normal muscle tone. Coordination normal.  Skin: Skin is warm and dry. No rash noted. She is not diaphoretic. No erythema. No pallor.  Psychiatric: She has a normal mood and affect. Her behavior is normal. Judgment and thought content normal.          Assessment & Plan:   No diagnosis found.  No problem-specific Assessment & Plan notes found for this encounter.    FOLLOW-UP:  No Follow-up on file.

## 2017-05-15 ENCOUNTER — Ambulatory Visit: Payer: 59 | Admitting: Adult Health

## 2017-05-23 ENCOUNTER — Other Ambulatory Visit: Payer: 59

## 2017-05-26 DIAGNOSIS — R1032 Left lower quadrant pain: Secondary | ICD-10-CM | POA: Diagnosis not present

## 2017-05-26 DIAGNOSIS — R109 Unspecified abdominal pain: Secondary | ICD-10-CM | POA: Diagnosis not present

## 2017-05-26 DIAGNOSIS — R1031 Right lower quadrant pain: Secondary | ICD-10-CM | POA: Diagnosis not present

## 2017-05-26 DIAGNOSIS — R1030 Lower abdominal pain, unspecified: Secondary | ICD-10-CM | POA: Diagnosis not present

## 2017-05-26 DIAGNOSIS — N3000 Acute cystitis without hematuria: Secondary | ICD-10-CM | POA: Diagnosis not present

## 2017-07-24 ENCOUNTER — Other Ambulatory Visit: Payer: Self-pay

## 2017-07-24 ENCOUNTER — Encounter (HOSPITAL_COMMUNITY): Payer: Self-pay

## 2017-07-24 ENCOUNTER — Emergency Department (HOSPITAL_COMMUNITY)
Admission: EM | Admit: 2017-07-24 | Discharge: 2017-07-24 | Disposition: A | Discharge status: Home / Self Care | Payer: 59 | Attending: Emergency Medicine | Admitting: Emergency Medicine

## 2017-07-24 DIAGNOSIS — G43909 Migraine, unspecified, not intractable, without status migrainosus: Secondary | ICD-10-CM | POA: Insufficient documentation

## 2017-07-24 DIAGNOSIS — Z91040 Latex allergy status: Secondary | ICD-10-CM | POA: Diagnosis not present

## 2017-07-24 DIAGNOSIS — Z79899 Other long term (current) drug therapy: Secondary | ICD-10-CM | POA: Diagnosis not present

## 2017-07-24 DIAGNOSIS — R51 Headache: Secondary | ICD-10-CM | POA: Diagnosis present

## 2017-07-24 MED ORDER — SODIUM CHLORIDE 0.9 % IV BOLUS (SEPSIS)
500.0000 mL | Freq: Once | INTRAVENOUS | Status: AC
  Administered 2017-07-24: 500 mL via INTRAVENOUS

## 2017-07-24 MED ORDER — KETOROLAC TROMETHAMINE 30 MG/ML IJ SOLN
30.0000 mg | Freq: Once | INTRAMUSCULAR | Status: AC
  Administered 2017-07-24: 30 mg via INTRAVENOUS
  Filled 2017-07-24: qty 1

## 2017-07-24 MED ORDER — DIPHENHYDRAMINE HCL 50 MG/ML IJ SOLN
12.5000 mg | Freq: Once | INTRAMUSCULAR | Status: AC
  Administered 2017-07-24: 12.5 mg via INTRAVENOUS
  Filled 2017-07-24: qty 1

## 2017-07-24 MED ORDER — PROCHLORPERAZINE EDISYLATE 5 MG/ML IJ SOLN
10.0000 mg | Freq: Once | INTRAMUSCULAR | Status: AC
  Administered 2017-07-24: 10 mg via INTRAVENOUS
  Filled 2017-07-24: qty 2

## 2017-07-24 NOTE — ED Provider Notes (Signed)
[facility] DEPT Provider Note   CSN: [id number] Arrival date & time: 07/24/17  1512     History   Chief Complaint Chief Complaint  Patient presents with  . Migraine    HPI [name] is a 32 y.o. female.  The history is provided by the patient and medical records. No language interpreter was used.   [name] is a 32 y.o. female  with a PMH of migraines who presents to the Emergency Department complaining of headache c/w her typical migraines. Pain is a sharp, throbbing sensation which has been intermittent over the last 3 days. Associated with nausea, photophobia and phonophobia all of which are typical for her. She tried goody's powder, Excedrin and ibuprofen with no relief. No fever, chills, neck pain, abdominal pain, vomiting, back pain, visual changes, numbness, tingling or weakness.   Past Medical History:  Diagnosis Date  . Anemia    after pregnancy  . Bipolar 1 disorder ([place])   . Blood dyscrasia    takes 11 1/2 minutes to clot - as of 01/20/16 no longer seems to have this problem  . Depression   . GERD (gastroesophageal reflux disease)    with pregnancy  . Gestational diabetes mellitus (GDM), antepartum   . Heart murmur    states she outgrew it  . History of IBS    in high school  . Kidney stones   . Migraine   . Uterine fibroid     Patient Active Problem List   Diagnosis Date Noted  . Rhinitis, chronic 04/26/2017  . Poor nutrition 04/03/2017  . Intractable migraine with aura with status migrainosus 04/03/2017  . Wound of right breast 03/29/2017  . Healthcare maintenance 03/29/2017  . HSV (herpes simplex virus) infection 03/29/2017  . Exposure to STD 02/08/2017  . Obesity, Class III, BMI 40-49.9 (morbid obesity) ([place]) 01/31/2017  . Bipolar 1 disorder ([place]) 01/31/2017  . Esophageal reflux   . History of gestational diabetes 06/10/2015    Past Surgical History:  Procedure Laterality Date  . CESAREAN SECTION      Nov 2011  . CESAREAN SECTION WITH BILATERAL TUBAL LIGATION Bilateral 07/26/2015   Procedure: C section ;  Surgeon: Mora Bellman, MD;  Location: [facility];  Service: Obstetrics;  Laterality: Bilateral;  . RENAL ARTERY STENT     placed and removed in 2005 - stents for kidney stones  . TOOTH EXTRACTION N/A 01/21/2016   Procedure: DENTAL RESTORATION/EXTRACTIONS;  Surgeon: Diona Browner, DDS;  Location: [facility];  Service: Oral Surgery;  Laterality: N/A;  . TUBAL LIGATION Bilateral 07/26/2015   Procedure: BILATERAL TUBAL LIGATION;  Surgeon: Mora Bellman, MD;  Location: [facility];  Service: Obstetrics;  Laterality: Bilateral;    OB History    Gravida Para Term Preterm AB Living   3 2 2   1 2    SAB TAB Ectopic Multiple Live Births   1     0 2       Home Medications    Prior to Admission medications   Medication Sig Start Date End Date Taking? Authorizing Provider  Aspirin-Acetaminophen-Caffeine (EXCEDRIN MIGRAINE PO) Take 2 tablets by mouth 4 (four) times daily as needed (MIGRAINE).   Yes [provider]  Chlorpheniramine-Phenylephrine (SINUS & ALLERGY PO) Take 4 tablets by mouth daily as needed (ALLERGIES).   Yes [provider]  ibuprofen (ADVIL,MOTRIN) 200 MG tablet Take 800 mg by mouth every 6 (six) hours as needed for headache, mild pain or moderate pain.  Yes [provider]  ondansetron (ZOFRAN-ODT) 8 MG disintegrating tablet Take 1 tablet (8 mg total) every 8 (eight) hours as needed by mouth for nausea or vomiting. 04/26/17  Yes Danford, Valetta Fuller D, NP  Phenylephrine HCl (SINEX ULTRA FINE MIST REGULAR NA) Place 2 sprays into the nose daily as needed (ALLERGIES).   Yes [provider]  propranolol ER (INDERAL LA) 80 MG 24 hr capsule Take 80 mg by mouth 2 (two) times daily. 04/26/17  Yes [provider]  Vitamin D, Ergocalciferol, (DRISDOL) 50000 units CAPS capsule Take 1 capsule (50,000 Units total) by mouth every 7 (seven) days. 02/07/17  Yes Danford, Valetta Fuller D,  NP  montelukast (SINGULAIR) 10 MG tablet Take 1 tablet (10 mg total) at bedtime by mouth. Patient not taking: Reported on 07/24/2017 04/26/17   Mina Marble D, NP  traMADol (ULTRAM) 50 MG tablet Take 1 tablet (50 mg total) by mouth every 8 (eight) hours as needed. Patient not taking: Reported on 07/24/2017 04/03/17   Esaw Grandchild, NP    Family History Family History  Problem Relation Age of Onset  . Hypertension Mother   . Depression Mother   . Diabetes Father   . Hypertension Father   . Heart disease Father   . Cancer Father        foot    Social History Social History   Tobacco Use  . Smoking status: Never Smoker  . Smokeless tobacco: Never Used  Substance Use Topics  . Alcohol use: Yes    Alcohol/week: 0.0 oz    Comment: occ  . Drug use: No     Allergies   Erythromycin; Latex; Food; and Ciprofloxacin   Review of Systems Review of Systems  Eyes: Positive for photophobia. Negative for visual disturbance.  Gastrointestinal: Positive for nausea. Negative for abdominal pain, constipation, diarrhea and vomiting.  Neurological: Positive for headaches. Negative for dizziness, weakness and numbness.  All other systems reviewed and are negative.    Physical Exam Updated Vital Signs BP 118/70 (BP Location: Left Arm)   Pulse 72   Temp 98.9 F (37.2 C) (Oral)   Resp 20   Ht 5\' 1"  (1.549 m)   Wt 99.1 kg (218 lb 8 oz)   SpO2 100%   BMI 41.29 kg/m   Physical Exam  Constitutional: She is oriented to person, place, and time. She appears well-developed and well-nourished. No distress.  HENT:  Head: Normocephalic and atraumatic.  Mouth/Throat: Oropharynx is clear and moist.  No tenderness of the temporal artery   Eyes: Conjunctivae and EOM are normal. Pupils are equal, round, and reactive to light. No scleral icterus.  No nystagmus   Neck: Normal range of motion. Neck supple.  Full active and passive ROM without pain.  No midline or paraspinal tenderness. No nuchal  rigidity or meningeal signs.  Cardiovascular: Normal rate, regular rhythm, normal heart sounds and intact distal pulses.  Pulmonary/Chest: Effort normal and breath sounds normal. No respiratory distress. She has no wheezes. She has no rales.  Abdominal: Soft. Bowel sounds are normal. She exhibits no distension. There is no tenderness. There is no rebound and no guarding.  Musculoskeletal: Normal range of motion.  Lymphadenopathy:    She has no cervical adenopathy.  Neurological: She is alert and oriented to person, place, and time. She has normal reflexes. No cranial nerve deficit. Coordination normal.  Alert, oriented, thought content appropriate, able to give a coherent history. Speech is clear and goal oriented, able to follow commands.  Cranial Nerves:  II:  Peripheral visual fields grossly normal, pupils equal, round, reactive to light III, IV, VI: EOM intact bilaterally, ptosis not present V,VII: smile symmetric, eyes kept closed tightly against resistance, facial light touch sensation equal VIII: hearing grossly normal IX, X: symmetric soft palate movement, uvula elevates symmetrically  XI: bilateral shoulder shrug symmetric and strong XII: midline tongue extension 5/5 muscle strength in upper and lower extremities bilaterally including strong and equal grip strength and dorsiflexion/plantar flexion Sensory to light touch normal in all four extremities.  Normal finger-to-nose and rapid alternating movements. No drift. Steady gait.  Skin: Skin is warm and dry. No rash noted. She is not diaphoretic.  Nursing note and vitals reviewed.    ED Treatments / Results  Labs (all labs ordered are listed, but only abnormal results are displayed) Labs Reviewed - No data to display  EKG  EKG Interpretation None       Radiology No results found.  Procedures Procedures (including critical care time)  Medications Ordered in ED Medications  ketorolac (TORADOL) 30 MG/ML injection 30  mg (30 mg Intravenous Given 07/24/17 1810)    And  prochlorperazine (COMPAZINE) injection 10 mg (10 mg Intravenous Given 07/24/17 1809)    And  diphenhydrAMINE (BENADRYL) injection 12.5 mg (12.5 mg Intravenous Given 07/24/17 1809)    And  sodium chloride 0.9 % bolus 500 mL (500 mLs Intravenous New Bag/Given 07/24/17 1808)     Initial Impression / Assessment and Plan / ED Course  I have reviewed the triage vital signs and the nursing notes.  Pertinent labs & imaging results that were available during my care of the patient were reviewed by me and considered in my medical decision making (see chart for details).    [name] is a 32 y.o. female who presents to ED for headache c/w their typical migraine. No focal neuro deficits on exam. Migraine cocktail and fluids given.   On re-evaluation, patient feels much improved. Headache resolved. The patient denies any neurologic symptoms such as visual changes, focal numbness/weakness, balance problems, confusion, or speech difficulty to suggest a life-threatening intracranial process such as intracranial hemorrhage or mass. The patient has no clotting risk factors thus venous sinus thrombosis is unlikely. No fevers, neck pain or nuchal rigidity to suggest meningitis. I feel that the patient is safe for discharge home at this time. PCP follow up strongly encouraged. I have reviewed return precautions including development of neurologic symptoms, confusion, lethargy, difficulty speaking, or new/worsening/concerning symptoms. All questions answered.   Final Clinical Impressions(s) / ED Diagnoses   Final diagnoses:  Migraine without status migrainosus, not intractable, unspecified migraine type    ED Discharge Orders    None       [name], Ozella Almond, PA-C 07/24/17 Docia Chuck    Orlie Dakin, MD 07/24/17 [phone]

## 2017-07-24 NOTE — ED Triage Notes (Signed)
Pt reports that she has been experiencing a migraine since Sunday. She states that it feels like her typical migraines. A&Ox4. No visual impairments noted.

## 2017-07-24 NOTE — Discharge Instructions (Signed)
It was my pleasure taking care of you today! ° °Drink plenty of fluids at home. This will help with your headache.  °Please follow up with your primary care doctor.   ° °Fortunately, your evaluation today is reassuring with no apparent emergent cause for your headache at this time. With that being said, it is VERY important that you monitor your symptoms at home. If you develop worsening headache, new fever, new neck stiffness, rash, weakness, numbness, trouble with your speech, trouble walking, new or worsening symptoms or any concerning symptoms, please return to the ED immediately.  °

## 2017-07-24 NOTE — ED Notes (Signed)
Bed: WTR6 Expected date:  Expected time:  Means of arrival:  Comments: Ok lol

## 2017-07-31 ENCOUNTER — Ambulatory Visit: Payer: Self-pay | Admitting: Neurology

## 2017-07-31 ENCOUNTER — Telehealth: Payer: Self-pay | Admitting: *Deleted

## 2017-07-31 NOTE — Telephone Encounter (Signed)
Pt no showed new pt appt on 07/31/2017 @ 2:30 pm.

## 2017-08-02 ENCOUNTER — Encounter: Payer: Self-pay | Admitting: Neurology

## 2018-02-02 ENCOUNTER — Emergency Department (HOSPITAL_COMMUNITY)
Admission: EM | Admit: 2018-02-02 | Discharge: 2018-02-02 | Disposition: A | Discharge status: Home / Self Care | Payer: 59 | Attending: Emergency Medicine | Admitting: Emergency Medicine

## 2018-02-02 ENCOUNTER — Encounter (HOSPITAL_COMMUNITY): Payer: Self-pay

## 2018-02-02 DIAGNOSIS — Z91040 Latex allergy status: Secondary | ICD-10-CM | POA: Insufficient documentation

## 2018-02-02 DIAGNOSIS — Z79899 Other long term (current) drug therapy: Secondary | ICD-10-CM | POA: Diagnosis not present

## 2018-02-02 DIAGNOSIS — G43001 Migraine without aura, not intractable, with status migrainosus: Secondary | ICD-10-CM | POA: Diagnosis not present

## 2018-02-02 DIAGNOSIS — G43901 Migraine, unspecified, not intractable, with status migrainosus: Secondary | ICD-10-CM

## 2018-02-02 DIAGNOSIS — R51 Headache: Secondary | ICD-10-CM | POA: Diagnosis present

## 2018-02-02 MED ORDER — PROCHLORPERAZINE EDISYLATE 10 MG/2ML IJ SOLN
10.0000 mg | Freq: Once | INTRAMUSCULAR | Status: AC
  Administered 2018-02-02: 10 mg via INTRAVENOUS
  Filled 2018-02-02: qty 2

## 2018-02-02 MED ORDER — DIPHENHYDRAMINE HCL 50 MG/ML IJ SOLN
25.0000 mg | Freq: Once | INTRAMUSCULAR | Status: AC
  Administered 2018-02-02: 25 mg via INTRAVENOUS
  Filled 2018-02-02 (×2): qty 1

## 2018-02-02 MED ORDER — KETOROLAC TROMETHAMINE 30 MG/ML IJ SOLN
30.0000 mg | Freq: Once | INTRAMUSCULAR | Status: AC
  Administered 2018-02-02: 30 mg via INTRAVENOUS
  Filled 2018-02-02: qty 1

## 2018-02-02 NOTE — ED Triage Notes (Signed)
Pt presents with c/o headache that started on Monday of this week. Pt reports hx of migraines. Pt also reports a family hx of aneurisms and stroke. Pt reports light sensitivity, and nausea.

## 2018-02-02 NOTE — Discharge Instructions (Addendum)
Follow-up with your primary care doctor, try to schedule the MRI is recommended your prior primary care doctor, return as needed for worsening symptoms

## 2018-02-02 NOTE — ED Provider Notes (Signed)
[facility] DEPT Provider Note   CSN: [id number] Arrival date & time: 02/02/18  1117     History   Chief Complaint Chief Complaint  Patient presents with  . Migraine    HPI [name] is a 32 y.o. female.  HPI Patient presents to the emergency room for evaluation of a headache that started on Monday.  Patient had gradual onset of headache earlier this week.  She has tried over-the-counter medications including Excedrin and Excedrin Migraine and Midol.  None of those medications have helped much.  Headache started on the front of her head and moved to the back.  She describes it as throbbing, aching, sharp.  Patient states there is a family history of aneurysm in the past.  Patient has been asked about brain imaging.  Reviewed the medical records and the patient's primary doctor has ordered an MRI and MRA previously.  Patient states she has not been able to schedule that yet because of her work schedule. Past Medical History:  Diagnosis Date  . Anemia    after pregnancy  . Bipolar 1 disorder ([place])   . Blood dyscrasia    takes 11 1/2 minutes to clot - as of 01/20/16 no longer seems to have this problem  . Depression   . GERD (gastroesophageal reflux disease)    with pregnancy  . Gestational diabetes mellitus (GDM), antepartum   . Heart murmur    states she outgrew it  . History of IBS    in high school  . Kidney stones   . Migraine   . Uterine fibroid     Patient Active Problem List   Diagnosis Date Noted  . Rhinitis, chronic 04/26/2017  . Poor nutrition 04/03/2017  . Intractable migraine with aura with status migrainosus 04/03/2017  . Wound of right breast 03/29/2017  . Healthcare maintenance 03/29/2017  . HSV (herpes simplex virus) infection 03/29/2017  . Exposure to STD 02/08/2017  . Obesity, Class III, BMI 40-49.9 (morbid obesity) ([place]) 01/31/2017  . Bipolar 1 disorder ([place]) 01/31/2017  . Esophageal reflux   . History of  gestational diabetes 06/10/2015    Past Surgical History:  Procedure Laterality Date  . CESAREAN SECTION     Nov 2011  . CESAREAN SECTION WITH BILATERAL TUBAL LIGATION Bilateral 07/26/2015   Procedure: C section ;  Surgeon: Mora Bellman, MD;  Location: [facility];  Service: Obstetrics;  Laterality: Bilateral;  . RENAL ARTERY STENT     placed and removed in 2005 - stents for kidney stones  . TOOTH EXTRACTION N/A 01/21/2016   Procedure: DENTAL RESTORATION/EXTRACTIONS;  Surgeon: Diona Browner, DDS;  Location: [facility];  Service: Oral Surgery;  Laterality: N/A;  . TUBAL LIGATION Bilateral 07/26/2015   Procedure: BILATERAL TUBAL LIGATION;  Surgeon: Mora Bellman, MD;  Location: [facility];  Service: Obstetrics;  Laterality: Bilateral;     OB History    Gravida  3   Para  2   Term  2   Preterm      AB  1   Living  2     SAB  1   TAB      Ectopic      Multiple  0   Live Births  2            Home Medications    Prior to Admission medications   Medication Sig Start Date End Date Taking? Authorizing Provider  Aspirin-Acetaminophen-Caffeine (EXCEDRIN MIGRAINE PO) Take 2 tablets by mouth 4 (  four) times daily as needed (MIGRAINE).    [provider]  Chlorpheniramine-Phenylephrine (SINUS & ALLERGY PO) Take 4 tablets by mouth daily as needed (ALLERGIES).    [provider]  ibuprofen (ADVIL,MOTRIN) 200 MG tablet Take 800 mg by mouth every 6 (six) hours as needed for headache, mild pain or moderate pain.    [provider]  montelukast (SINGULAIR) 10 MG tablet Take 1 tablet (10 mg total) at bedtime by mouth. Patient not taking: Reported on 07/24/2017 04/26/17   Mina Marble D, NP  ondansetron (ZOFRAN-ODT) 8 MG disintegrating tablet Take 1 tablet (8 mg total) every 8 (eight) hours as needed by mouth for nausea or vomiting. 04/26/17   Danford, Valetta Fuller D, NP  Phenylephrine HCl (SINEX ULTRA FINE MIST REGULAR NA) Place 2 sprays into the nose daily as needed (ALLERGIES).     [provider]  propranolol ER (INDERAL LA) 80 MG 24 hr capsule Take 80 mg by mouth 2 (two) times daily. 04/26/17   [provider]  traMADol (ULTRAM) 50 MG tablet Take 1 tablet (50 mg total) by mouth every 8 (eight) hours as needed. Patient not taking: Reported on 07/24/2017 04/03/17   Mina Marble D, NP  Vitamin D, Ergocalciferol, (DRISDOL) 50000 units CAPS capsule Take 1 capsule (50,000 Units total) by mouth every 7 (seven) days. 02/07/17   Esaw Grandchild, NP    Family History Family History  Problem Relation Age of Onset  . Hypertension Mother   . Depression Mother   . Diabetes Father   . Hypertension Father   . Heart disease Father   . Cancer Father        foot    Social History Social History   Tobacco Use  . Smoking status: Never Smoker  . Smokeless tobacco: Never Used  Substance Use Topics  . Alcohol use: Yes    Alcohol/week: 0.0 standard drinks    Comment: occ  . Drug use: No     Allergies   Erythromycin; Latex; Food; and Ciprofloxacin   Review of Systems Review of Systems  All other systems reviewed and are negative.    Physical Exam Updated Vital Signs BP 129/88 (BP Location: Left Arm)   Pulse 79   Temp 97.8 F (36.6 C) (Oral)   Resp 18   Ht 1.549 m (5\' 1" )   Wt 104.3 kg   LMP 01/17/2018 (Approximate)   SpO2 99%   BMI 43.46 kg/m   Physical Exam  Constitutional: She appears well-developed and well-nourished. No distress.  HENT:  Head: Normocephalic and atraumatic.  Right Ear: External ear normal.  Left Ear: External ear normal.  Eyes: Conjunctivae are normal. Right eye exhibits no discharge. Left eye exhibits no discharge. No scleral icterus.  Neck: Normal range of motion. Neck supple. No tracheal deviation present.  Cardiovascular: Normal rate, regular rhythm and intact distal pulses.  Pulmonary/Chest: Effort normal and breath sounds normal. No stridor. No respiratory distress. She has no wheezes. She has no rales.   Abdominal: Soft. Bowel sounds are normal. She exhibits no distension. There is no tenderness. There is no rebound and no guarding.  Musculoskeletal: She exhibits no edema or tenderness.  Neurological: She is alert. She has normal strength. No cranial nerve deficit (no facial droop, extraocular movements intact, no slurred speech) or sensory deficit. She exhibits normal muscle tone. She displays no seizure activity. Coordination normal.  Skin: Skin is warm and dry. No rash noted.  Psychiatric: She has a normal mood and affect.  Nursing note and vitals reviewed.    ED Treatments / Results  Labs (all labs ordered are listed, but only abnormal results are displayed) Labs Reviewed - No data to display   Procedures Procedures (including critical care time)  Medications Ordered in ED Medications  ketorolac (TORADOL) 30 MG/ML injection 30 mg (30 mg Intravenous Given 02/02/18 1242)  diphenhydrAMINE (BENADRYL) injection 25 mg (25 mg Intravenous Given 02/02/18 1239)  prochlorperazine (COMPAZINE) injection 10 mg (10 mg Intravenous Given 02/02/18 1240)     Initial Impression / Assessment and Plan / ED Course  I have reviewed the triage vital signs and the nursing notes.  Pertinent labs & imaging results that were available during my care of the patient were reviewed by me and considered in my medical decision making (see chart for details).  Clinical Course as of Feb 02 1410  Sat Feb 02, 2018  1411 Patient is feeling much better and she is ready to go home   [JK]    Clinical Course User Index [JK] Dorie Rank, MD  Patient presented to the emergency room for evaluation of a migraine headache.  Gradual onset of headache and no acute neurologic symptoms.  Doubt angitis subarachnoid hemorrhage or other emergency etiology patient's symptoms improved with migraine cocktail.  At this time there does not appear to be any evidence of an acute emergency medical condition and the patient appears stable  for discharge with appropriate outpatient follow up.   Final Clinical Impressions(s) / ED Diagnoses   Final diagnoses:  Migraine with status migrainosus, not intractable, unspecified migraine type    ED Discharge Orders    None       Dorie Rank, MD 02/02/18 [phone]

## 2018-02-02 NOTE — ED Notes (Signed)
Pt reports headache is completely gone . Pain is 0/10.

## 2018-02-13 NOTE — Progress Notes (Signed)
Subjective:    Patient ID: [name], female    DOB: Nov 14, 1985, 32 y.o.   MRN: [id number]  HPI: [name] is here for f/u on migraine She was seen in ED on 02/02/2018 for migraine with status migrainosus, not intractable. She was given toradol 30mg , Diphenhydramine 25mg , and prochlorperazine 10mg . Imaging was not accomplished, she was d/c'd home in stable condition. She currently reports mild migraine with pain over L frontal/parietal region. Pain is a constant, dull ache rated 2/1) She denies current visual disturbance, however does report aura will occur frequently with migraine. She estimates to experience 1-20 migraines a month with onset during highschool. She denies tobacco/ETOH use She continues to eat a diet high in saturated fat/sugar. She drinks >8 soda pops/day She has increased plain water intake to >40 oz/day She denies regular exercise We again discussed her sig family hx of brain aneurysm and the importance of her having the MRI completed that was ordered Fall 2018 She was also referred to Neurology 11/208- she never established She reports sig fatigue r/t to working a "rotating shift schedule"- every week she rotates between first, second, third shift Husband at [employer] [facility] [employer] during [place]  Patient Care Team    Relationship Specialty Notifications Start End  Danford, Berna Spare, NP PCP - General Family Medicine  01/23/17     Patient Active Problem List   Diagnosis Date Noted  . Rhinitis, chronic 04/26/2017  . Poor nutrition 04/03/2017  . Intractable migraine with aura with status migrainosus 04/03/2017  . Wound of right breast 03/29/2017  . Healthcare maintenance 03/29/2017  . HSV (herpes simplex virus) infection 03/29/2017  . Exposure to STD 02/08/2017  . Obesity, Class III, BMI 40-49.9 (morbid obesity) ([place]) 01/31/2017  . Bipolar 1 disorder ([place]) 01/31/2017  . Esophageal reflux   . History of gestational diabetes 06/10/2015     Past Medical History:  Diagnosis Date   . Anemia    after pregnancy  . Bipolar 1 disorder ([place])   . Blood dyscrasia    takes 11 1/2 minutes to clot - as of 01/20/16 no longer seems to have this problem  . Depression   . GERD (gastroesophageal reflux disease)    with pregnancy  . Gestational diabetes mellitus (GDM), antepartum   . Heart murmur    states she outgrew it  . History of IBS    in high school  . Kidney stones   . Migraine   . Uterine fibroid      Past Surgical History:  Procedure Laterality Date  . CESAREAN SECTION     Nov 2011  . CESAREAN SECTION WITH BILATERAL TUBAL LIGATION Bilateral 07/26/2015   Procedure: C section ;  Surgeon: Mora Bellman, MD;  Location: [facility];  Service: Obstetrics;  Laterality: Bilateral;  . RENAL ARTERY STENT     placed and removed in 2005 - stents for kidney stones  . TOOTH EXTRACTION N/A 01/21/2016   Procedure: DENTAL RESTORATION/EXTRACTIONS;  Surgeon: Diona Browner, DDS;  Location: [facility];  Service: Oral Surgery;  Laterality: N/A;  . TUBAL LIGATION Bilateral 07/26/2015   Procedure: BILATERAL TUBAL LIGATION;  Surgeon: Mora Bellman, MD;  Location: [facility];  Service: Obstetrics;  Laterality: Bilateral;     Family History  Problem Relation Age of Onset  . Hypertension Mother   . Depression Mother   . Diabetes Father   . Hypertension Father   . Heart disease Father   . Cancer Father        foot  Social History   Substance and Sexual Activity  Drug Use No     Social History   Substance and Sexual Activity  Alcohol Use Yes  . Alcohol/week: 0.0 standard drinks   Comment: occ     Social History   Tobacco Use  Smoking Status Never Smoker  Smokeless Tobacco Never Used     Outpatient Encounter Medications as of 02/14/2018  Medication Sig  . Aspirin-Acetaminophen-Caffeine (EXCEDRIN MIGRAINE PO) Take 2 tablets by mouth 4 (four) times daily as needed (MIGRAINE).  . Chlorpheniramine-Phenylephrine (SINUS & ALLERGY PO) Take 4 tablets by mouth daily as needed  (ALLERGIES).  Marland Kitchen FLUoxetine (PROZAC) 20 MG capsule Take 20 mg by mouth at bedtime.  Marland Kitchen ibuprofen (ADVIL,MOTRIN) 200 MG tablet Take 800 mg by mouth every 6 (six) hours as needed for headache, mild pain or moderate pain.  . montelukast (SINGULAIR) 10 MG tablet Take 1 tablet (10 mg total) by mouth at bedtime.  Marland Kitchen OLANZapine (ZYPREXA) 7.5 MG tablet Take 7.5 mg by mouth at bedtime.  . ondansetron (ZOFRAN-ODT) 8 MG disintegrating tablet Take 1 tablet (8 mg total) every 8 (eight) hours as needed by mouth for nausea or vomiting.  Marland Kitchen Phenylephrine HCl (SINEX ULTRA FINE MIST REGULAR NA) Place 2 sprays into the nose daily as needed (ALLERGIES).  Marland Kitchen propranolol ER (INDERAL LA) 80 MG 24 hr capsule Take 1 capsule (80 mg total) by mouth daily.  . traMADol (ULTRAM) 50 MG tablet Take 1 tablet (50 mg total) by mouth every 8 (eight) hours as needed.  . [DISCONTINUED] montelukast (SINGULAIR) 10 MG tablet Take 1 tablet (10 mg total) at bedtime by mouth.  . [DISCONTINUED] propranolol ER (INDERAL LA) 80 MG 24 hr capsule Take 80 mg by mouth 2 (two) times daily.  Marland Kitchen topiramate (TOPAMAX) 25 MG tablet Take 1 tablet (25 mg total) by mouth daily.  . [DISCONTINUED] Vitamin D, Ergocalciferol, (DRISDOL) 50000 units CAPS capsule Take 1 capsule (50,000 Units total) by mouth every 7 (seven) days.   No facility-administered encounter medications on file as of 02/14/2018.     Allergies: Erythromycin; Latex; Food; and Ciprofloxacin  Body mass index is 44.89 kg/m.  Blood pressure 116/80, pulse 90, height 5' 0.5" (1.537 m), weight 233 lb 11.2 oz (106 kg), last menstrual period 01/17/2018, SpO2 98 %.  Review of Systems  Constitutional: Positive for fatigue. Negative for activity change, appetite change, chills, diaphoresis, fever and unexpected weight change.  HENT: Negative for congestion.   Eyes: Negative for visual disturbance.  Respiratory: Negative for cough, chest tightness, shortness of breath, wheezing and stridor.    Cardiovascular: Negative for chest pain, palpitations and leg swelling.  Skin: Negative for color change, pallor, rash and wound.  Neurological: Positive for headaches. Negative for dizziness, tremors, syncope, facial asymmetry, speech difficulty, weakness and light-headedness.  Hematological: Does not bruise/bleed easily.  Psychiatric/Behavioral: Negative for behavioral problems, confusion, decreased concentration, dysphoric mood, hallucinations, self-injury, sleep disturbance and suicidal ideas. The patient is not nervous/anxious and is not hyperactive.        Objective:   Physical Exam  Constitutional: She is oriented to person, place, and time. She appears well-developed and well-nourished. No distress.  Appears very fatigued  HENT:  Head: Normocephalic and atraumatic.  Right Ear: External ear normal.  Left Ear: External ear normal.  Nose: Nose normal.  Mouth/Throat: Oropharynx is clear and moist.  Cardiovascular: Normal rate, regular rhythm, normal heart sounds and intact distal pulses.  No murmur heard. Pulmonary/Chest: Effort normal and breath sounds normal.  No stridor. No respiratory distress. She has no wheezes. She has no rales. She exhibits no tenderness.  Neurological: She is alert and oriented to person, place, and time. Coordination normal.  Skin: Skin is warm and dry. Capillary refill takes less than 2 seconds. No rash noted. She is not diaphoretic. No erythema. There is pallor.  Her baseline is pale complexion  Psychiatric: She has a normal mood and affect. Her behavior is normal. Judgment and thought content normal.      Assessment & Plan:   1. Healthcare maintenance   2. History of gestational diabetes   3. Obesity, Class III, BMI 40-49.9 (morbid obesity) ([place])   4. Intractable migraine with aura with status migrainosus   5. Bipolar 1 disorder ([facility])     Intractable migraine with aura with status migrainosus Needs to establish with Neurology Needs to complete  MRI Started on topiramate 25mg  QD Follow Mediterranean diet and increase plain water intake.   Bipolar 1 disorder ([place]) Managed by [facility] Currently on Fluoxetine (Prozac) 20mg  QD and Olanzapine (Zyprexa) 7.5mg  QD Mood stable  Healthcare maintenance Please continue all medications as directed, refills provided as needed. Please start once daily Topiramate 25mg  once daily- recommend taking prior to sleep. Increase water intake, strive for at least 100 ounces/day.   Follow Mediterranean diet. Increase regular exercise.  Recommend at least 30 minutes daily, 5 days per week of walking, jogging, biking, swimming, YouTube/Pinterest workout videos. Recommend having MRI completed and establishing with Neurologist. Please schedule fasting lab appt in the next few weeks and a complete physical in 2-3 months. Continue with [facility] as directed  Obesity, Class III, BMI 40-49.9 (morbid obesity) ([facility]) Body mass index is 44.89 kg/m.  Current wt 233 >20 lkb wt gain since last OV Nov 2018 Please start once daily Topiramate 25mg  once daily- recommend taking prior to sleep. Increase water intake, strive for at least 100 ounces/day.   Follow Mediterranean diet. Increase regular exercise.  Recommend at least 30 minutes daily, 5 days per week of walking, jogging, biking, swimming, YouTube/Pinterest workout videos.    Pt was in the office today for 25+ minutes, I spent >75% of time in face to face counseling of patient's various medical conditions and in coordination of care  FOLLOW-UP:  Return in about 3 months (around 05/17/2018) for CPE, Fasting Labs.

## 2018-02-14 ENCOUNTER — Ambulatory Visit: Payer: 59 | Admitting: Adult Health

## 2018-02-14 ENCOUNTER — Encounter: Payer: Self-pay | Admitting: Adult Health

## 2018-02-14 VITALS — BP 116/80 | HR 90 | Ht 60.5 in | Wt 233.7 lb

## 2018-02-14 DIAGNOSIS — Z8632 Personal history of gestational diabetes: Secondary | ICD-10-CM | POA: Diagnosis not present

## 2018-02-14 DIAGNOSIS — F319 Bipolar disorder, unspecified: Secondary | ICD-10-CM

## 2018-02-14 DIAGNOSIS — Z0000 Encounter for general adult medical examination without abnormal findings: Secondary | ICD-10-CM

## 2018-02-14 DIAGNOSIS — G43111 Migraine with aura, intractable, with status migrainosus: Secondary | ICD-10-CM

## 2018-02-14 DIAGNOSIS — E66813 Obesity, class 3: Secondary | ICD-10-CM

## 2018-02-14 MED ORDER — MONTELUKAST SODIUM 10 MG PO TABS: 10.0000 mg | ORAL_TABLET | Freq: Every day | ORAL | 3 refills | Status: AC

## 2018-02-14 MED ORDER — PROPRANOLOL HCL ER 80 MG PO CP24
80.0000 mg | ORAL_CAPSULE | Freq: Every day | ORAL | 3 refills | Status: DC
Start: 2018-02-14 — End: 2018-02-26

## 2018-02-14 MED ORDER — TOPIRAMATE 25 MG PO TABS: 25.0000 mg | ORAL_TABLET | Freq: Every day | ORAL | 1 refills | Status: AC

## 2018-02-14 NOTE — Assessment & Plan Note (Signed)
Body mass index is 44.89 kg/m.  Current wt 233 >20 lkb wt gain since last OV Nov 2018 Please start once daily Topiramate 25mg  once daily- recommend taking prior to sleep. Increase water intake, strive for at least 100 ounces/day.   Follow Mediterranean diet. Increase regular exercise.  Recommend at least 30 minutes daily, 5 days per week of walking, jogging, biking, swimming, YouTube/Pinterest workout videos.

## 2018-02-14 NOTE — Assessment & Plan Note (Signed)
Please continue all medications as directed, refills provided as needed. Please start once daily Topiramate 25mg  once daily- recommend taking prior to sleep. Increase water intake, strive for at least 100 ounces/day.   Follow Mediterranean diet. Increase regular exercise.  Recommend at least 30 minutes daily, 5 days per week of walking, jogging, biking, swimming, YouTube/Pinterest workout videos. Recommend having MRI completed and establishing with Neurologist. Please schedule fasting lab appt in the next few weeks and a complete physical in 2-3 months. Continue with [facility] as directed

## 2018-02-14 NOTE — Patient Instructions (Addendum)
Migraine Headache A migraine headache is an intense, throbbing pain on one side or both sides of the head. Migraines may also cause other symptoms, such as nausea, vomiting, and sensitivity to light and noise. What are the causes? Doing or taking certain things may also trigger migraines, such as:  Alcohol.  Smoking.  Medicines, such as: ? Medicine used to treat chest pain (nitroglycerine). ? Birth control pills. ? Estrogen pills. ? Certain blood pressure medicines.  Aged cheeses, chocolate, or caffeine.  Foods or drinks that contain nitrates, glutamate, aspartame, or tyramine.  Physical activity.  Other things that may trigger a migraine include:  Menstruation.  Pregnancy.  Hunger.  Stress, lack of sleep, too much sleep, or fatigue.  Weather changes.  What increases the risk? The following factors may make you more likely to experience migraine headaches:  Age. Risk increases with age.  Family history of migraine headaches.  Being Caucasian.  Depression and anxiety.  Obesity.  Being a woman.  Having a hole in the heart (patent foramen ovale) or other heart problems.  What are the signs or symptoms? The main symptom of this condition is pulsating or throbbing pain. Pain may:  Happen in any area of the head, such as on one side or both sides.  Interfere with daily activities.  Get worse with physical activity.  Get worse with exposure to bright lights or loud noises.  Other symptoms may include:  Nausea.  Vomiting.  Dizziness.  General sensitivity to bright lights, loud noises, or smells.  Before you get a migraine, you may get warning signs that a migraine is developing (aura). An aura may include:  Seeing flashing lights or having blind spots.  Seeing bright spots, halos, or zigzag lines.  Having tunnel vision or blurred vision.  Having numbness or a tingling feeling.  Having trouble talking.  Having muscle weakness.  How is this  diagnosed? A migraine headache can be diagnosed based on:  Your symptoms.  A physical exam.  Tests, such as CT scan or MRI of the head. These imaging tests can help rule out other causes of headaches.  Taking fluid from the spine (lumbar puncture) and analyzing it (cerebrospinal fluid analysis, or CSF analysis).  How is this treated? A migraine headache is usually treated with medicines that:  Relieve pain.  Relieve nausea.  Prevent migraines from coming back.  Treatment may also include:  Acupuncture.  Lifestyle changes like avoiding foods that trigger migraines.  Follow these instructions at home: Medicines  Take over-the-counter and prescription medicines only as told by your health care provider.  Do not drive or use heavy machinery while taking prescription pain medicine.  To prevent or treat constipation while you are taking prescription pain medicine, your health care provider may recommend that you: ? Drink enough fluid to keep your urine clear or pale yellow. ? Take over-the-counter or prescription medicines. ? Eat foods that are high in fiber, such as fresh fruits and vegetables, whole grains, and beans. ? Limit foods that are high in fat and processed sugars, such as fried and sweet foods. Lifestyle  Avoid alcohol use.  Do not use any products that contain nicotine or tobacco, such as cigarettes and e-cigarettes. If you need help quitting, ask your health care provider.  Get at least 8 hours of sleep every night.  Limit your stress. General instructions   Keep a journal to find out what may trigger your migraine headaches. For example, write down: ? What you eat and   drink. ? How much sleep you get. ? Any change to your diet or medicines.  If you have a migraine: ? Avoid things that make your symptoms worse, such as bright lights. ? It may help to lie down in a dark, quiet room. ? Do not drive or use heavy machinery. ? Ask your health care provider  what activities are safe for you while you are experiencing symptoms.  Keep all follow-up visits as told by your health care provider. This is important. Contact a health care provider if:  You develop symptoms that are different or more severe than your usual migraine symptoms. Get help right away if:  Your migraine becomes severe.  You have a fever.  You have a stiff neck.  You have vision loss.  Your muscles feel weak or like you cannot control them.  You start to lose your balance often.  You develop trouble walking.  You faint. This information is not intended to replace advice given to you by your health care provider. Make sure you discuss any questions you have with your health care provider. Document Released: 06/05/2005 Document Revised: 12/24/2015 Document Reviewed: 11/22/2015 Elsevier Interactive Patient Education  2017 [place] refers to food and lifestyle choices that are based on the traditions of countries located on the [employer]. This way of eating has been shown to help prevent certain conditions and improve outcomes for people who have chronic diseases, like kidney disease and heart disease. What are tips for following this plan? Lifestyle  Cook and eat meals together with your family, when possible.  Drink enough fluid to keep your urine clear or pale yellow.  Be physically active every day. This includes: ? Aerobic exercise like running or swimming. ? Leisure activities like gardening, walking, or housework.  Get 7-8 hours of sleep each night.  If recommended by your health care provider, drink red wine in moderation. This means 1 glass a day for nonpregnant women and 2 glasses a day for men. A glass of wine equals 5 oz (150 mL). Reading food labels  Check the serving size of packaged foods. For foods such as rice and pasta, the serving size refers to the amount of cooked product, not  dry.  Check the total fat in packaged foods. Avoid foods that have saturated fat or trans fats.  Check the ingredients list for added sugars, such as corn syrup. Shopping  At the grocery store, buy most of your food from the areas near the walls of the store. This includes: ? Fresh fruits and vegetables (produce). ? Grains, beans, nuts, and seeds. Some of these may be available in unpackaged forms or large amounts (in bulk). ? Fresh seafood. ? Poultry and eggs. ? Low-fat dairy products.  Buy whole ingredients instead of prepackaged foods.  Buy fresh fruits and vegetables in-season from local farmers markets.  Buy frozen fruits and vegetables in resealable bags.  If you do not have access to quality fresh seafood, buy precooked frozen shrimp or canned fish, such as tuna, salmon, or sardines.  Buy small amounts of raw or cooked vegetables, salads, or olives from the deli or salad bar at your store.  Stock your pantry so you always have certain foods on hand, such as olive oil, canned tuna, canned tomatoes, rice, pasta, and beans. Cooking  Cook foods with extra-virgin olive oil instead of using butter or other vegetable oils.  Have meat as a side dish, and have  vegetables or grains as your main dish. This means having meat in small portions or adding small amounts of meat to foods like pasta or stew.  Use beans or vegetables instead of meat in common dishes like chili or lasagna.  Experiment with different cooking methods. Try roasting or broiling vegetables instead of steaming or sauteing them.  Add frozen vegetables to soups, stews, pasta, or rice.  Add nuts or seeds for added healthy fat at each meal. You can add these to yogurt, salads, or vegetable dishes.  Marinate fish or vegetables using olive oil, lemon juice, garlic, and fresh herbs. Meal planning  Plan to eat 1 vegetarian meal one day each week. Try to work up to 2 vegetarian meals, if possible.  Eat seafood 2 or  more times a week.  Have healthy snacks readily available, such as: ? Vegetable sticks with hummus. ? [place] yogurt. ? Fruit and nut trail mix.  Eat balanced meals throughout the week. This includes: ? Fruit: 2-3 servings a day ? Vegetables: 4-5 servings a day ? Low-fat dairy: 2 servings a day ? Fish, poultry, or lean meat: 1 serving a day ? Beans and legumes: 2 or more servings a week ? Nuts and seeds: 1-2 servings a day ? Whole grains: 6-8 servings a day ? Extra-virgin olive oil: 3-4 servings a day  Limit red meat and sweets to only a few servings a month What are my food choices?  Mediterranean diet ? Recommended ? Grains: Whole-grain pasta. Brown rice. Bulgar wheat. Polenta. Couscous. Whole-wheat bread. [place]. ? Vegetables: Artichokes. Beets. Broccoli. Cabbage. Carrots. Eggplant. Green beans. Chard. Kale. Spinach. Onions. Leeks. Peas. Squash. Tomatoes. Peppers. Radishes. ? Fruits: Apples. Apricots. Avocado. Berries. Bananas. Cherries. Dates. Figs. Grapes. Lemons. Melon. Oranges. Peaches. Plums. Pomegranate. ? Meats and other protein foods: Beans. Almonds. Sunflower seeds. Pine nuts. Peanuts. [place]. Salmon. Scallops. Shrimp. [place]. Tilapia. Clams. Oysters. Eggs. ? Dairy: Low-fat milk. Cheese. Greek yogurt. ? Beverages: Water. Red wine. Herbal tea. ? Fats and oils: Extra virgin olive oil. Avocado oil. Grape seed oil. ? Sweets and desserts: [place] yogurt with honey. Baked apples. Poached pears. Trail mix. ? Seasoning and other foods: Basil. Cilantro. Coriander. Cumin. Mint. Parsley. Sage. Rosemary. Tarragon. Garlic. Oregano. Thyme. Pepper. Balsalmic vinegar. Tahini. Hummus. Tomato sauce. Olives. Mushrooms. ? Limit these ? Grains: Prepackaged pasta or rice dishes. Prepackaged cereal with added sugar. ? Vegetables: Deep fried potatoes (french fries). ? Fruits: Fruit canned in syrup. ? Meats and other protein foods: Beef. Pork. Lamb. Poultry with skin. Hot dogs. Berniece Salines. ? Dairy:  Ice cream. Sour cream. Whole milk. ? Beverages: Juice. Sugar-sweetened soft drinks. Beer. Liquor and spirits. ? Fats and oils: Butter. Canola oil. Vegetable oil. Beef fat (tallow). Lard. ? Sweets and desserts: Cookies. Cakes. Pies. Candy. ? Seasoning and other foods: Mayonnaise. Premade sauces and marinades. ? The items listed may not be a complete list. Talk with your dietitian about what dietary choices are right for you. Summary  The Mediterranean diet includes both food and lifestyle choices.  Eat a variety of fresh fruits and vegetables, beans, nuts, seeds, and whole grains.  Limit the amount of red meat and sweets that you eat.  Talk with your health care provider about whether it is safe for you to drink red wine in moderation. This means 1 glass a day for nonpregnant women and 2 glasses a day for men. A glass of wine equals 5 oz (150 mL). This information is not intended to replace advice given  to you by your health care provider. Make sure you discuss any questions you have with your health care provider. Document Released: 01/27/2016 Document Revised: 02/29/2016 Document Reviewed: 01/27/2016 Elsevier Interactive Patient Education  [employer].  Please continue all medications as directed, refills provided as needed. Please start once daily Topiramate 25mg  once daily- recommend taking prior to sleep. Increase water intake, strive for at least 100 ounces/day.   Follow Mediterranean diet. Increase regular exercise.  Recommend at least 30 minutes daily, 5 days per week of walking, jogging, biking, swimming, YouTube/Pinterest workout videos. Recommend having MRI completed and establishing with Neurologist. Please schedule fasting lab appt in the next few weeks and a complete physical in 2-3 months. Continue with [facility] as directed. NICE TO SEE YOU!

## 2018-02-14 NOTE — Assessment & Plan Note (Addendum)
Needs to establish with Neurology Needs to complete MRI Started on topiramate 25mg  QD Follow Mediterranean diet and increase plain water intake.

## 2018-02-14 NOTE — Assessment & Plan Note (Signed)
Managed by [facility] Currently on Fluoxetine (Prozac) 20mg  QD and Olanzapine (Zyprexa) 7.5mg  QD Mood stable

## 2018-02-26 ENCOUNTER — Telehealth: Payer: Self-pay

## 2018-02-26 ENCOUNTER — Other Ambulatory Visit: Payer: Self-pay | Admitting: Adult Health

## 2018-02-26 NOTE — Telephone Encounter (Signed)
-----   Message from Esaw Grandchild, NP sent at 02/26/2018  8:00 AM EDT ----- Kermit Balo Morning Kenney Houseman, Can you please call Ms. Rolland and tell her to stop Inderal- unsafe to use with her hx of asthma. Please tell her to continue topiramate. Please ask if she has made an appt with Neurology. Inderal has been d/c'd in system Thanks! Valetta Fuller

## 2018-02-26 NOTE — Telephone Encounter (Signed)
LVM for pt to return call to discuss.  Charyl Bigger, CMA

## 2018-02-26 NOTE — Progress Notes (Signed)
Inderal d/c'd Continue with Topiramate 25mg  QD

## 2018-02-28 NOTE — Telephone Encounter (Signed)
LVM for pt to call to discuss.  T. Nelson, CMA  

## 2018-03-04 NOTE — ED Notes (Signed)
Called for vitals x1 with no response 

## 2018-03-05 ENCOUNTER — Encounter: Payer: Self-pay | Admitting: Adult Health

## 2018-03-05 ENCOUNTER — Ambulatory Visit (INDEPENDENT_AMBULATORY_CARE_PROVIDER_SITE_OTHER): Payer: 59 | Admitting: Adult Health

## 2018-03-05 ENCOUNTER — Emergency Department (HOSPITAL_COMMUNITY): Admission: EM | Admit: 2018-03-05 | Discharge: 2018-03-05 | Discharge status: Against Medical Advice | Payer: 59

## 2018-03-05 ENCOUNTER — Other Ambulatory Visit: Payer: Self-pay

## 2018-03-05 VITALS — BP 109/77 | HR 106 | Temp 99.1°F | Ht 60.5 in | Wt 227.0 lb

## 2018-03-05 DIAGNOSIS — Z8632 Personal history of gestational diabetes: Secondary | ICD-10-CM

## 2018-03-05 DIAGNOSIS — R1030 Lower abdominal pain, unspecified: Secondary | ICD-10-CM | POA: Diagnosis not present

## 2018-03-05 DIAGNOSIS — N3000 Acute cystitis without hematuria: Secondary | ICD-10-CM

## 2018-03-05 DIAGNOSIS — Z0000 Encounter for general adult medical examination without abnormal findings: Secondary | ICD-10-CM

## 2018-03-05 LAB — POCT URINALYSIS DIP (MANUAL ENTRY)
BILIRUBIN UA: NEGATIVE
BILIRUBIN UA: NEGATIVE mg/dL
Glucose, UA: NEGATIVE mg/dL
NITRITE UA: NEGATIVE
PH UA: 6.5 (ref 5.0–8.0)
Protein Ur, POC: 100 mg/dL — AB
Spec Grav, UA: 1.025 (ref 1.010–1.025)
Urobilinogen, UA: 2 E.U./dL — AB

## 2018-03-05 MED ORDER — ONDANSETRON 8 MG PO TBDP: 8.0000 mg | ORAL_TABLET | Freq: Three times a day (TID) | ORAL | 0 refills | Status: AC | PRN

## 2018-03-05 MED ORDER — NITROFURANTOIN MONOHYD MACRO 100 MG PO CAPS: 100.0000 mg | ORAL_CAPSULE | Freq: Two times a day (BID) | ORAL | 0 refills | Status: AC

## 2018-03-05 MED ORDER — PHENAZOPYRIDINE HCL 200 MG PO TABS: 200.0000 mg | ORAL_TABLET | Freq: Three times a day (TID) | ORAL | 0 refills | Status: AC | PRN

## 2018-03-05 MED ORDER — TRAMADOL HCL 50 MG PO TABS: 50.0000 mg | ORAL_TABLET | Freq: Three times a day (TID) | ORAL | 0 refills | Status: AC | PRN

## 2018-03-05 NOTE — ED Notes (Signed)
Called pt for triage x3 No response

## 2018-03-05 NOTE — Patient Instructions (Addendum)
Urinary Tract Infection, Adult A urinary tract infection (UTI) is an infection of any part of the urinary tract. The urinary tract includes the:  Kidneys.  Ureters.  Bladder.  Urethra.  These organs make, store, and get rid of pee (urine) in the body. Follow these instructions at home:  Take over-the-counter and prescription medicines only as told by your doctor.  If you were prescribed an antibiotic medicine, take it as told by your doctor. Do not stop taking the antibiotic even if you start to feel better.  Avoid the following drinks: ? Alcohol. ? Caffeine. ? Tea. ? Carbonated drinks.  Drink enough fluid to keep your pee clear or pale yellow.  Keep all follow-up visits as told by your doctor. This is important.  Make sure to: ? Empty your bladder often and completely. Do not to hold pee for long periods of time. ? Empty your bladder before and after sex. ? Wipe from front to back after a bowel movement if you are female. Use each tissue one time when you wipe. Contact a doctor if:  You have back pain.  You have a fever.  You feel sick to your stomach (nauseous).  You throw up (vomit).  Your symptoms do not get better after 3 days.  Your symptoms go away and then come back. Get help right away if:  You have very bad back pain.  You have very bad lower belly (abdominal) pain.  You are throwing up and cannot keep down any medicines or water. This information is not intended to replace advice given to you by your health care provider. Make sure you discuss any questions you have with your health care provider. Document Released: 11/22/2007 Document Revised: 11/11/2015 Document Reviewed: 04/26/2015 Elsevier Interactive Patient Education  2018 [place] Diet A bland diet consists of foods that do not have a lot of fat or fiber. Foods without fat or fiber are easier for the body to digest. They are also less likely to irritate your mouth, throat,  stomach, and other parts of your gastrointestinal tract. A bland diet is sometimes called a BRAT diet. What is my plan? Your health care provider or dietitian may recommend specific changes to your diet to prevent and treat your symptoms, such as:  Eating small meals often.  Cooking food until it is soft enough to chew easily.  Chewing your food well.  Drinking fluids slowly.  Not eating foods that are very spicy, sour, or fatty.  Not eating citrus fruits, such as oranges and grapefruit.  What do I need to know about this diet?  Eat a variety of foods from the bland diet food list.  Do not follow a bland diet longer than you have to.  Ask your health care provider whether you should take vitamins. What foods can I eat? Grains  Hot cereals, such as cream of wheat. Bread, crackers, or tortillas made from refined white flour. Rice. Vegetables Canned or cooked vegetables. Mashed or boiled potatoes. Fruits Bananas. Applesauce. Other types of cooked or canned fruit with the skin and seeds removed, such as canned peaches or pears. Meats and Other Protein Sources Scrambled eggs. Creamy peanut butter or other nut butters. Lean, well-cooked meats, such as chicken or fish. Tofu. Soups or broths. Dairy Low-fat dairy products, such as milk, cottage cheese, or yogurt. Beverages Water. Herbal tea. Apple juice. Sweets and Desserts Pudding. Custard. Fruit gelatin. Ice cream. Fats and Oils Mild salad dressings. Canola or olive  oil. The items listed above may not be a complete list of allowed foods or beverages. Contact your dietitian for more options. What foods are not recommended? Foods and ingredients that are often not recommended include:  Spicy foods, such as hot sauce or salsa.  Fried foods.  Sour foods, such as pickled or fermented foods.  Raw vegetables or fruits, especially citrus or berries.  Caffeinated drinks.  Alcohol.  Strongly flavored seasonings or  condiments.  The items listed above may not be a complete list of foods and beverages that are not allowed. Contact your dietitian for more information. This information is not intended to replace advice given to you by your health care provider. Make sure you discuss any questions you have with your health care provider. Document Released: 09/27/2015 Document Revised: 11/11/2015 Document Reviewed: 06/17/2014 Elsevier Interactive Patient Education  2018 [employer].  Please take Macrobid 100mg  twice daily, take for 7 days. Please take Pyridium 200mg  three times daily, take for 3 days. Please use Ultram 50mg  every 8 hrs as needed for pain. Sip on fluids and follow BRAT Diet. Please use Ondansetron as needed. Work Excuse provided, okay to return Monday 23 Sept. Stop taking Propranolol (Inderal). Seek immediate medical are if Red Flag symptoms occur ie, chest pain, severe abdominal pain, high fever, severe nausea/vomting. Follow-up as needed.  FEEL BETTER!

## 2018-03-05 NOTE — ED Notes (Signed)
Called for vitals. 2nd call. No answer

## 2018-03-05 NOTE — Progress Notes (Signed)
Subjective:    Patient ID: [name], female    DOB: Aug 19, 1985, 32 y.o.   MRN: [id number]  HPI:  [name] presents with L flank pain (constant sharp pain that will radiate to LLQ, rated 8/10), N/V, dysuria, frequency, chills, and HA that all started 24 hrs ago. She went to local ED, waited 30 minutes then left prior to being triaged. She reports passing several small stones last night, hx of nephrolithiasis. Last contact with Urologist >12 years ago. She took OTC Acetaminophen 2000 last night, Ondansetron 8mg  at 2200, and Tramadol 50mg  0830 this am- with moderate rx relief. Last PO intake was Jello at noon today, she was able to keep it down, however has been dry heaving since 1300 today. She denies fever/CP/dsypnea/palpitations.  We have been unable to reach her in regards to Propanolol (Inderal) Informed her to stop taking and discard she verbalized understanding/agreement.  Mother is at [facility] during [place]  Patient Care Team    Relationship Specialty Notifications Start End  Esaw Grandchild, NP PCP - General Family Medicine  01/23/17     Patient Active Problem List   Diagnosis Date Noted  . Acute cystitis without hematuria 03/05/2018  . Rhinitis, chronic 04/26/2017  . Poor nutrition 04/03/2017  . Intractable migraine with aura with status migrainosus 04/03/2017  . Wound of right breast 03/29/2017  . Healthcare maintenance 03/29/2017  . HSV (herpes simplex virus) infection 03/29/2017  . Exposure to STD 02/08/2017  . Obesity, Class III, BMI 40-49.9 (morbid obesity) ([place]) 01/31/2017  . Bipolar 1 disorder ([place]) 01/31/2017  . Esophageal reflux   . History of gestational diabetes 06/10/2015     Past Medical History:  Diagnosis Date  . Anemia    after pregnancy  . Bipolar 1 disorder ([place])   . Blood dyscrasia    takes 11 1/2 minutes to clot - as of 01/20/16 no longer seems to have this problem  . Depression   . GERD (gastroesophageal reflux disease)    with pregnancy  .  Gestational diabetes mellitus (GDM), antepartum   . Heart murmur    states she outgrew it  . History of IBS    in high school  . Kidney stones   . Migraine   . Uterine fibroid      Past Surgical History:  Procedure Laterality Date  . CESAREAN SECTION     Nov 2011  . CESAREAN SECTION WITH BILATERAL TUBAL LIGATION Bilateral 07/26/2015   Procedure: C section ;  Surgeon: Mora Bellman, MD;  Location: [facility];  Service: Obstetrics;  Laterality: Bilateral;  . RENAL ARTERY STENT     placed and removed in 2005 - stents for kidney stones  . TOOTH EXTRACTION N/A 01/21/2016   Procedure: DENTAL RESTORATION/EXTRACTIONS;  Surgeon: Diona Browner, DDS;  Location: [facility];  Service: Oral Surgery;  Laterality: N/A;  . TUBAL LIGATION Bilateral 07/26/2015   Procedure: BILATERAL TUBAL LIGATION;  Surgeon: Mora Bellman, MD;  Location: [facility];  Service: Obstetrics;  Laterality: Bilateral;     Family History  Problem Relation Age of Onset  . Hypertension Mother   . Depression Mother   . Diabetes Father   . Hypertension Father   . Heart disease Father   . Cancer Father        foot     Social History   Substance and Sexual Activity  Drug Use No     Social History   Substance and Sexual Activity  Alcohol Use Yes  .  Alcohol/week: 0.0 standard drinks   Comment: occ     Social History   Tobacco Use  Smoking Status Never Smoker  Smokeless Tobacco Never Used     Outpatient Encounter Medications as of 03/05/2018  Medication Sig  . Aspirin-Acetaminophen-Caffeine (EXCEDRIN MIGRAINE PO) Take 2 tablets by mouth 4 (four) times daily as needed (MIGRAINE).  . Chlorpheniramine-Phenylephrine (SINUS & ALLERGY PO) Take 4 tablets by mouth daily as needed (ALLERGIES).  Marland Kitchen FLUoxetine (PROZAC) 20 MG capsule Take 20 mg by mouth at bedtime.  Marland Kitchen ibuprofen (ADVIL,MOTRIN) 200 MG tablet Take 800 mg by mouth every 6 (six) hours as needed for headache, mild pain or moderate pain.  Marland Kitchen OLANZapine (ZYPREXA) 7.5 MG  tablet Take 7.5 mg by mouth at bedtime.  . ondansetron (ZOFRAN-ODT) 8 MG disintegrating tablet Take 1 tablet (8 mg total) by mouth every 8 (eight) hours as needed for nausea or vomiting.  Marland Kitchen Phenylephrine HCl (SINEX ULTRA FINE MIST REGULAR NA) Place 2 sprays into the nose daily as needed (ALLERGIES).  Marland Kitchen traMADol (ULTRAM) 50 MG tablet Take 1 tablet (50 mg total) by mouth every 8 (eight) hours as needed.  . [DISCONTINUED] ondansetron (ZOFRAN-ODT) 8 MG disintegrating tablet Take 1 tablet (8 mg total) every 8 (eight) hours as needed by mouth for nausea or vomiting.  . [DISCONTINUED] traMADol (ULTRAM) 50 MG tablet Take 1 tablet (50 mg total) by mouth every 8 (eight) hours as needed.  . montelukast (SINGULAIR) 10 MG tablet Take 1 tablet (10 mg total) by mouth at bedtime. (Patient not taking: Reported on 03/05/2018)  . nitrofurantoin, macrocrystal-monohydrate, (MACROBID) 100 MG capsule Take 1 capsule (100 mg total) by mouth 2 (two) times daily.  . phenazopyridine (PYRIDIUM) 200 MG tablet Take 1 tablet (200 mg total) by mouth 3 (three) times daily as needed for up to 2 days for pain.  Marland Kitchen topiramate (TOPAMAX) 25 MG tablet Take 1 tablet (25 mg total) by mouth daily. (Patient not taking: Reported on 03/05/2018)   No facility-administered encounter medications on file as of 03/05/2018.     Allergies: Erythromycin; Latex; Food; and Ciprofloxacin  Body mass index is 43.6 kg/m.  Blood pressure 109/77, pulse (!) 106, temperature 99.1 F (37.3 C), temperature source Oral, height 5' 0.5" (1.537 m), weight 227 lb (103 kg), SpO2 100 %.  Review of Systems  Constitutional: Positive for activity change, appetite change, chills and fatigue. Negative for diaphoresis, fever and unexpected weight change.  HENT: Negative for congestion.   Eyes: Negative for visual disturbance.  Respiratory: Positive for cough. Negative for chest tightness, shortness of breath, wheezing and stridor.   Cardiovascular: Negative for chest  pain, palpitations and leg swelling.  Gastrointestinal: Positive for abdominal pain, nausea and vomiting. Negative for abdominal distention, blood in stool and constipation.  Endocrine: Negative for cold intolerance, heat intolerance, polydipsia, polyphagia and polyuria.  Genitourinary: Positive for dysuria, flank pain and frequency. Negative for difficulty urinating.  Neurological: Positive for headaches. Negative for dizziness.  Hematological: Does not bruise/bleed easily.  Psychiatric/Behavioral: Positive for sleep disturbance.       Objective:   Physical Exam  Constitutional: She is oriented to person, place, and time. She appears well-developed and well-nourished. She appears ill. She appears distressed.  HENT:  Head: Normocephalic and atraumatic.  Right Ear: External ear normal.  Left Ear: External ear normal.  Nose: Nose normal.  Mouth/Throat: Oropharynx is clear and moist.  Eyes: Pupils are equal, round, and reactive to light. Conjunctivae and EOM are normal.  Cardiovascular: Regular rhythm,  normal heart sounds and intact distal pulses. Tachycardia present.  No murmur heard. Pulmonary/Chest: Effort normal and breath sounds normal. No stridor. No respiratory distress. She has no wheezes. She has no rales. She exhibits no tenderness.  Abdominal: Soft. Bowel sounds are normal. She exhibits no distension and no mass. There is tenderness in the suprapubic area. There is CVA tenderness. There is no rebound, no guarding, no tenderness at McBurney's point and negative Murphy's sign. No hernia.  Neurological: She is alert and oriented to person, place, and time.  Skin: Skin is warm and dry. Capillary refill takes less than 2 seconds. No rash noted. No erythema. No pallor.  Psychiatric: She has a normal mood and affect. Her behavior is normal. Judgment and thought content normal.  Nursing note and vitals reviewed.     Assessment & Plan:   1. Lower abdominal pain   2. Healthcare  maintenance   3. History of gestational diabetes   4. Obesity, Class III, BMI 40-49.9 (morbid obesity) ([place])   5. Acute cystitis without hematuria     Acute cystitis without hematuria UA: Blood Mod Leu Small Urine sent for c/s Please take Macrobid 100mg  twice daily, take for 7 days. Please take Pyridium 200mg  three times daily, take for 3 days. Please use Ultram 50mg  every 8 hrs as needed for pain. Sip on fluids and follow BRAT Diet. Please use Ondansetron as needed. Work Excuse provided, okay to return Monday 23 Sept. Stop taking Propranolol (Inderal). Seek immediate medical are if Red Flag symptoms occur ie, chest pain, severe abdominal pain, high fever, severe nausea/vomting. Follow-up as needed.   Pt was in the office today for 25+ minutes,I spent > 75%  Of time in face to face counseling of patient's various medical conditions and in coordination of care  FOLLOW-UP:  Return if symptoms worsen or fail to improve.

## 2018-03-05 NOTE — Assessment & Plan Note (Signed)
UA: Blood Mod Leu Small Urine sent for c/s Please take Macrobid 100mg  twice daily, take for 7 days. Please take Pyridium 200mg  three times daily, take for 3 days. Please use Ultram 50mg  every 8 hrs as needed for pain. Sip on fluids and follow BRAT Diet. Please use Ondansetron as needed. Work Excuse provided, okay to return Monday 23 Sept. Stop taking Propranolol (Inderal). Seek immediate medical are if Red Flag symptoms occur ie, chest pain, severe abdominal pain, high fever, severe nausea/vomting. Follow-up as needed.

## 2018-03-05 NOTE — Assessment & Plan Note (Signed)
Unable to obtain CMP and CBC due to acute distress

## 2018-03-08 ENCOUNTER — Other Ambulatory Visit: Payer: Self-pay

## 2018-03-08 ENCOUNTER — Emergency Department (HOSPITAL_COMMUNITY): Payer: 59

## 2018-03-08 ENCOUNTER — Encounter (HOSPITAL_COMMUNITY): Payer: Self-pay

## 2018-03-08 ENCOUNTER — Emergency Department (HOSPITAL_COMMUNITY)
Admission: EM | Admit: 2018-03-08 | Discharge: 2018-03-08 | Disposition: A | Discharge status: Home / Self Care | Payer: 59 | Attending: Emergency Medicine | Admitting: Emergency Medicine

## 2018-03-08 DIAGNOSIS — N132 Hydronephrosis with renal and ureteral calculous obstruction: Secondary | ICD-10-CM | POA: Diagnosis not present

## 2018-03-08 DIAGNOSIS — N200 Calculus of kidney: Secondary | ICD-10-CM

## 2018-03-08 DIAGNOSIS — R1032 Left lower quadrant pain: Secondary | ICD-10-CM | POA: Diagnosis not present

## 2018-03-08 DIAGNOSIS — Z79899 Other long term (current) drug therapy: Secondary | ICD-10-CM | POA: Diagnosis not present

## 2018-03-08 LAB — URINALYSIS, ROUTINE W REFLEX MICROSCOPIC
BILIRUBIN URINE: NEGATIVE
GLUCOSE, UA: NEGATIVE mg/dL
Ketones, ur: NEGATIVE mg/dL
NITRITE: NEGATIVE
Protein, ur: 100 mg/dL — AB
SPECIFIC GRAVITY, URINE: 1.011 (ref 1.005–1.030)
pH: 6 (ref 5.0–8.0)

## 2018-03-08 LAB — I-STAT BETA HCG BLOOD, ED (MC, WL, AP ONLY)

## 2018-03-08 LAB — CBC WITH DIFFERENTIAL/PLATELET
Abs Immature Granulocytes: 0.2 10*3/uL — ABNORMAL HIGH (ref 0.0–0.1)
BASOS ABS: 0.1 10*3/uL (ref 0.0–0.1)
Basophils Relative: 1 %
Eosinophils Absolute: 0.1 10*3/uL (ref 0.0–0.7)
Eosinophils Relative: 1 %
HEMATOCRIT: 32.7 % — AB (ref 36.0–46.0)
HEMOGLOBIN: 10.4 g/dL — AB (ref 12.0–15.0)
IMMATURE GRANULOCYTES: 2 %
LYMPHS ABS: 1 10*3/uL (ref 0.7–4.0)
LYMPHS PCT: 11 %
MCH: 25.6 pg — ABNORMAL LOW (ref 26.0–34.0)
MCHC: 31.8 g/dL (ref 30.0–36.0)
MCV: 80.5 fL (ref 78.0–100.0)
Monocytes Absolute: 1.1 10*3/uL — ABNORMAL HIGH (ref 0.1–1.0)
Monocytes Relative: 12 %
NEUTROS PCT: 73 %
Neutro Abs: 6.9 10*3/uL (ref 1.7–7.7)
Platelets: 163 10*3/uL (ref 150–400)
RBC: 4.06 MIL/uL (ref 3.87–5.11)
RDW: 16.1 % — AB (ref 11.5–15.5)
WBC: 9.3 10*3/uL (ref 4.0–10.5)

## 2018-03-08 LAB — COMPREHENSIVE METABOLIC PANEL
ALT: 28 U/L (ref 0–44)
AST: 30 U/L (ref 15–41)
Albumin: 2.8 g/dL — ABNORMAL LOW (ref 3.5–5.0)
Alkaline Phosphatase: 100 U/L (ref 38–126)
Anion gap: 12 (ref 5–15)
BILIRUBIN TOTAL: 1 mg/dL (ref 0.3–1.2)
BUN: 6 mg/dL (ref 6–20)
CHLORIDE: 101 mmol/L (ref 98–111)
CO2: 20 mmol/L — ABNORMAL LOW (ref 22–32)
Calcium: 8.5 mg/dL — ABNORMAL LOW (ref 8.9–10.3)
Creatinine, Ser: 0.94 mg/dL (ref 0.44–1.00)
Glucose, Bld: 128 mg/dL — ABNORMAL HIGH (ref 70–99)
POTASSIUM: 3 mmol/L — AB (ref 3.5–5.1)
Sodium: 133 mmol/L — ABNORMAL LOW (ref 135–145)
TOTAL PROTEIN: 7.1 g/dL (ref 6.5–8.1)

## 2018-03-08 LAB — URINE CULTURE

## 2018-03-08 MED ORDER — SULFAMETHOXAZOLE-TRIMETHOPRIM 800-160 MG PO TABS: 1.0000 | ORAL_TABLET | Freq: Two times a day (BID) | ORAL | 0 refills | Status: AC

## 2018-03-08 NOTE — Discharge Instructions (Signed)
Stop taking Macrobid, start taking Bactrim.  Take the entire course. Continue with Tylenol/ibuprofen as needed for mild to moderate pain.  Continue tramadol as needed for severe pain. Continue using Zofran as needed for nausea or vomiting. Make sure you are staying well-hydrated with water. Follow up with urology early next week for further evaluation management of your kidney stone. Return to the emergency room if you develop high fevers, worsening symptoms, severe abdominal pain, persistent vomiting despite medication, or inability to urinate.

## 2018-03-08 NOTE — ED Provider Notes (Signed)
[facility] EMERGENCY DEPARTMENT Provider Note   CSN: [id number] Arrival date & time: 03/08/18  1244     History   Chief Complaint Chief Complaint  Patient presents with  . Urinary Tract Infection    HPI [name] is a 32 y.o. female center for evaluation of left ankle and lower quadrant pain.  Patient states that past 5 days, she has been having left low back pain.  She was diagnosed with a UTI 3 days ago, started on Macrobid and Pyridium.  Initially symptoms improved, but they worsened again yesterday.  She reports subjective chills, but no objective fevers.  Last dose of antibiotics was this morning.  She has tramadol as needed at home for pain, last used last night.  She denies urinary symptoms including dysuria, hematuria, urinary frequency.  She reports a history of kidney stones, but has not followed up with urology in the past 12 years.  She reports a lack of appetite, had 2 episodes of vomiting.  Patient states he has no other medical problems, takes no medications daily.  She has not taken anything for pain today.  She denies sore throat, cough, chest pain, shortness of breath, upper abdominal pain.  Patient reports 2-3 episodes of loose stools in the past 2 days, nonbloody.   HPI  Past Medical History:  Diagnosis Date  . Anemia    after pregnancy  . Bipolar 1 disorder ([place])   . Blood dyscrasia    takes 11 1/2 minutes to clot - as of 01/20/16 no longer seems to have this problem  . Depression   . GERD (gastroesophageal reflux disease)    with pregnancy  . Gestational diabetes mellitus (GDM), antepartum   . Heart murmur    states she outgrew it  . History of IBS    in high school  . Kidney stones   . Migraine   . Uterine fibroid     Patient Active Problem List   Diagnosis Date Noted  . Acute cystitis without hematuria 03/05/2018  . Rhinitis, chronic 04/26/2017  . Poor nutrition 04/03/2017  . Intractable migraine with aura with status  migrainosus 04/03/2017  . Wound of right breast 03/29/2017  . Healthcare maintenance 03/29/2017  . HSV (herpes simplex virus) infection 03/29/2017  . Exposure to STD 02/08/2017  . Obesity, Class III, BMI 40-49.9 (morbid obesity) ([place]) 01/31/2017  . Bipolar 1 disorder ([place]) 01/31/2017  . Esophageal reflux   . History of gestational diabetes 06/10/2015    Past Surgical History:  Procedure Laterality Date  . CESAREAN SECTION     Nov 2011  . CESAREAN SECTION WITH BILATERAL TUBAL LIGATION Bilateral 07/26/2015   Procedure: C section ;  Surgeon: Mora Bellman, MD;  Location: [facility];  Service: Obstetrics;  Laterality: Bilateral;  . RENAL ARTERY STENT     placed and removed in 2005 - stents for kidney stones  . TOOTH EXTRACTION N/A 01/21/2016   Procedure: DENTAL RESTORATION/EXTRACTIONS;  Surgeon: Diona Browner, DDS;  Location: [facility];  Service: Oral Surgery;  Laterality: N/A;  . TUBAL LIGATION Bilateral 07/26/2015   Procedure: BILATERAL TUBAL LIGATION;  Surgeon: Mora Bellman, MD;  Location: [facility];  Service: Obstetrics;  Laterality: Bilateral;     OB History    Gravida  3   Para  2   Term  2   Preterm      AB  1   Living  2     SAB  1   TAB  Ectopic      Multiple  0   Live Births  2            Home Medications    Prior to Admission medications   Medication Sig Start Date End Date Taking? Authorizing Provider  Aspirin-Acetaminophen-Caffeine (EXCEDRIN MIGRAINE PO) Take 2 tablets by mouth 4 (four) times daily as needed (MIGRAINE).    [provider]  Chlorpheniramine-Phenylephrine (SINUS & ALLERGY PO) Take 4 tablets by mouth daily as needed (ALLERGIES).    [provider]  FLUoxetine (PROZAC) 20 MG capsule Take 20 mg by mouth at bedtime.    [provider]  ibuprofen (ADVIL,MOTRIN) 200 MG tablet Take 800 mg by mouth every 6 (six) hours as needed for headache, mild pain or moderate pain.    [provider]  montelukast (SINGULAIR)  10 MG tablet Take 1 tablet (10 mg total) by mouth at bedtime. Patient not taking: Reported on 03/05/2018 02/14/18   Mina Marble D, NP  nitrofurantoin, macrocrystal-monohydrate, (MACROBID) 100 MG capsule Take 1 capsule (100 mg total) by mouth 2 (two) times daily. 03/05/18   Danford, Valetta Fuller D, NP  OLANZapine (ZYPREXA) 7.5 MG tablet Take 7.5 mg by mouth at bedtime.    [provider]  ondansetron (ZOFRAN-ODT) 8 MG disintegrating tablet Take 1 tablet (8 mg total) by mouth every 8 (eight) hours as needed for nausea or vomiting. 03/05/18   Danford, Valetta Fuller D, NP  Phenylephrine HCl (SINEX ULTRA FINE MIST REGULAR NA) Place 2 sprays into the nose daily as needed (ALLERGIES).    [provider]  sulfamethoxazole-trimethoprim (BACTRIM DS,SEPTRA DS) 800-160 MG tablet Take 1 tablet by mouth 2 (two) times daily for 7 days. 03/08/18 03/15/18  Decorey Wahlert, PA-C  topiramate (TOPAMAX) 25 MG tablet Take 1 tablet (25 mg total) by mouth daily. Patient not taking: Reported on 03/05/2018 02/14/18   Mina Marble D, NP  traMADol (ULTRAM) 50 MG tablet Take 1 tablet (50 mg total) by mouth every 8 (eight) hours as needed. 03/05/18   Esaw Grandchild, NP    Family History Family History  Problem Relation Age of Onset  . Hypertension Mother   . Depression Mother   . Diabetes Father   . Hypertension Father   . Heart disease Father   . Cancer Father        foot    Social History Social History   Tobacco Use  . Smoking status: Never Smoker  . Smokeless tobacco: Never Used  Substance Use Topics  . Alcohol use: Yes    Alcohol/week: 0.0 standard drinks    Comment: occ  . Drug use: No     Allergies   Erythromycin; Latex; Food; and Ciprofloxacin   Review of Systems Review of Systems  Gastrointestinal: Positive for abdominal pain, diarrhea, nausea and vomiting.  All other systems reviewed and are negative.    Physical Exam Updated Vital Signs BP 114/79   Pulse 76   Temp 98.6 F (37 C)  (Oral)   Resp 16   Ht 5\' 1"  (1.549 m)   Wt 103 kg   LMP 02/22/2018   SpO2 100%   BMI 42.89 kg/m   Physical Exam  Constitutional: She is oriented to person, place, and time. She appears well-developed and well-nourished. No distress.  Sitting in the bed in no acute distress  HENT:  Head: Normocephalic and atraumatic.  Eyes: Pupils are equal, round, and reactive to light. Conjunctivae and EOM are normal.  Neck: Normal range of motion.  Neck supple.  Cardiovascular: Normal rate, regular rhythm and intact distal pulses.  Pulmonary/Chest: Effort normal and breath sounds normal. No respiratory distress. She has no wheezes.  Abdominal: Soft. She exhibits no distension and no mass. There is tenderness. There is no rebound and no guarding.  Mild left-sided CVA tenderness.  Mild tenderness palpation of suprapubic and left lower quadrant abdomen without rigidity, guarding, distention.  Negative rebound.  No signs of parotitis.  Musculoskeletal: Normal range of motion.  Neurological: She is alert and oriented to person, place, and time.  Skin: Skin is warm and dry. Capillary refill takes less than 2 seconds.  Psychiatric: She has a normal mood and affect.  Nursing note and vitals reviewed.    ED Treatments / Results  Labs (all labs ordered are listed, but only abnormal results are displayed) Labs Reviewed  COMPREHENSIVE METABOLIC PANEL - Abnormal; Notable for the following components:      Result Value   Sodium 133 (*)    Potassium 3.0 (*)    CO2 20 (*)    Glucose, Bld 128 (*)    Calcium 8.5 (*)    Albumin 2.8 (*)    All other components within normal limits  CBC WITH DIFFERENTIAL/PLATELET - Abnormal; Notable for the following components:   Hemoglobin 10.4 (*)    HCT 32.7 (*)    MCH 25.6 (*)    RDW 16.1 (*)    Monocytes Absolute 1.1 (*)    Abs Immature Granulocytes 0.2 (*)    All other components within normal limits  URINALYSIS, ROUTINE W REFLEX MICROSCOPIC - Abnormal; Notable  for the following components:   Color, Urine AMBER (*)    APPearance CLOUDY (*)    Hgb urine dipstick LARGE (*)    Protein, ur 100 (*)    Leukocytes, UA SMALL (*)    Bacteria, UA FEW (*)    All other components within normal limits  URINE CULTURE  I-STAT BETA HCG BLOOD, ED ([facility], [facility], [facility] ONLY)    EKG None  Radiology Ct Renal Stone Study  Result Date: 03/08/2018 CLINICAL DATA:  Bilateral flank pain. EXAM: CT ABDOMEN AND PELVIS WITHOUT CONTRAST TECHNIQUE: Multidetector CT imaging of the abdomen and pelvis was performed following the standard protocol without IV contrast. COMPARISON:  CT scan of May 26, 2017. FINDINGS: Lower chest: No acute abnormality. Hepatobiliary: No gallstones are noted. No biliary dilatation is noted. Fatty infiltration of the liver is noted. Pancreas: Unremarkable. No pancreatic ductal dilatation or surrounding inflammatory changes. Spleen: Mild splenomegaly is noted. Adrenals/Urinary Tract: Adrenal glands appear normal. Small nonobstructive right renal calculus is noted. Minimal left hydronephrosis with minimal perinephric stranding is noted secondary to 12 mm calculus in proximal left ureter. Urinary bladder is unremarkable. Stomach/Bowel: Stomach is within normal limits. Appendix appears normal. No evidence of bowel wall thickening, distention, or inflammatory changes. Vascular/Lymphatic: No significant vascular findings are present. No enlarged abdominal or pelvic lymph nodes. Reproductive: Uterus and bilateral adnexa are unremarkable. Other: No abdominal wall hernia or abnormality. No abdominopelvic ascites. Musculoskeletal: No acute or significant osseous findings. IMPRESSION: Minimal left hydronephrosis with minimal perinephric stranding is noted secondary to 12 mm calculus in proximal left ureter. Nonobstructive right renal calculus is noted. Fatty infiltration of the liver. Mild splenomegaly. Electronically Signed   By: Marijo Conception, M.D.   On: 03/08/2018 15:00     Procedures Procedures (including critical care time)  Medications Ordered in ED Medications - No data to display   Initial Impression / Assessment  and Plan / ED Course  I have reviewed the triage vital signs and the nursing notes.  Pertinent labs & imaging results that were available during my care of the patient were reviewed by me and considered in my medical decision making (see chart for details).     Pt presenting for evaluation of left flank and left lower quadrant pain.  she is currently being treated for UTI with Macrobid.  Physical exam reassuring, she is afebrile and not tachycardic.  Appears nontoxic.  Does not appear in significant amount of pain, despite last dose of tramadol being yesterday.  As she had worsening symptoms while on antibiotics, will obtain labs, urine, urine culture.  Patient was CVA tenderness, will obtain CT renal for further evaluation.  CT renal positive for left kidney stone with mild left perinephric stranding and hydronephrosis.  Urine dirty, shows small leuks no nitrates.  Similar to urine from 3 days ago.  No white count.  Creatinine stable.  Patient remains in stable condition and pain is well controlled.  Discussed with attending, Dr. Ralene Bathe agrees to plan.  Will consult with urology.  Discussed with Dr. Diona Fanti from urology, who states as patient is without leukocytosis, is not febrile, and appears nontoxic, can follow-up with urology early next week.  Will switch antibiotic from Macrobid to Bactrim to improve kidney coverage.  Discussed with patient, who is agreeable to plan.  At this time, patient appears safe for discharge.  Return precautions given.  Patient states she understands and agrees plan.   Final Clinical Impressions(s) / ED Diagnoses   Final diagnoses:  Kidney stone on left side    ED Discharge Orders         Ordered    sulfamethoxazole-trimethoprim (BACTRIM DS,SEPTRA DS) 800-160 MG tablet  2 times daily     03/08/18 [place], Ianna Salmela, PA-C 03/08/18 1610    Quintella Reichert, MD 03/09/18 0945

## 2018-03-08 NOTE — ED Triage Notes (Signed)
Pt states that she was recently diagnosed with UTI, has been taking medications without improvement. Pt reports back pain and feeling flushed. Denies burning with urination, denies hematuria.

## 2018-03-09 LAB — URINE CULTURE

## 2018-03-11 NOTE — Telephone Encounter (Signed)
Pt seen in office 03/05/18.  Discussed with pt per Mina Marble, NP.  Charyl Bigger, CMA

## 2018-03-12 ENCOUNTER — Other Ambulatory Visit: Payer: Self-pay

## 2018-03-12 DIAGNOSIS — N200 Calculus of kidney: Secondary | ICD-10-CM

## 2018-03-12 DIAGNOSIS — N3000 Acute cystitis without hematuria: Secondary | ICD-10-CM

## 2018-03-25 ENCOUNTER — Telehealth: Payer: Self-pay

## 2018-03-25 DIAGNOSIS — N201 Calculus of ureter: Secondary | ICD-10-CM | POA: Diagnosis not present

## 2018-03-25 DIAGNOSIS — Z87440 Personal history of urinary (tract) infections: Secondary | ICD-10-CM | POA: Diagnosis not present

## 2018-03-25 NOTE — Telephone Encounter (Signed)
Advised pt that Valetta Fuller can only complete FMLA paperwork for 03/05/18.  Pt expressed understanding.  FMLA paperwork completed and placed at front desk for pick up.  Charyl Bigger, CMA

## 2018-03-25 NOTE — Telephone Encounter (Signed)
Pt previously dropped off FMLA paperwork.  Called pt to inform her that Valetta Fuller is only willing to fill the paperwork out for her last visit with [place] (03/05/18) for abdominal pain, but will not fill out papers re: migraines because she has not seen neuro as directed.  LVM for pt to return call to discuss.  Charyl Bigger, CMA

## 2018-03-26 ENCOUNTER — Other Ambulatory Visit: Payer: Self-pay | Admitting: Urology

## 2018-03-26 MED ORDER — CEPHALEXIN 250 MG PO CAPS: 500.0000 mg | ORAL_CAPSULE | Freq: Once | ORAL | Status: AC

## 2018-03-26 NOTE — H&P (Signed)
Office Visit Report     03/25/2018   --------------------------------------------------------------------------------     --------------------------------------------------------------------------------   CC: I have ureteral stone.  HPI: [name] is a 32 year-old female patient who was referred by Mina Marble, NP who is here for ureteral stone.  The problem is on the left side. She first stated noticing pain on 03/05/2018. This is not her first kidney stone. She is not currently having flank pain, back pain, groin pain, nausea, vomiting, fever or chills. She has not caught a stone in her urine strainer since her symptoms began.   She has had ureteral stent for treatment of her stones in the past.   Josefita was seen in the ER on 03/05/18 with UTI symptoms and was given macrobid. She had e. coli on the culture. She was back in the ER on 03/08/18 she was having left flank pain with nausea and vomiting and was found on CT to have a 42mm left proximal ureteral stone. She has had pain intermittently since then with the last bout on Thursday. She has had no hematuria. She has had some frequency and urgency. The location of the pain hasn't changed. She has had no fever but had some chills on 9/17. Those have passed. She is a former patient of Dr. Janice Norrie and has had a stent in the past. She didn't tolerate the stent. She has passed several stones.      ALLERGIES: Ciprofloxacin Erythromycin    MEDICATIONS: Olanzapine 7.5 mg tablet  Prozac 20 mg capsule  Topiramate     GU PSH: None     PSH Notes: renal stints     NON-GU PSH: C-Section    GU PMH: None   NON-GU PMH: Bipolar disorder, unspecified Depression GERD    FAMILY HISTORY: Kidney Failure - Mother Kidney Stones - Grandmother   SOCIAL HISTORY: Marital Status: Married Preferred Language: English; Race: White Current Smoking Status: Patient has never smoked.   Tobacco Use Assessment Completed: Used Tobacco in last 30  days? Drinks 4+ caffeinated drinks per day.     Notes: 1 son, 1 daughter   REVIEW OF SYSTEMS:    GU Review Female:   Patient reports frequent urination. Patient denies hard to postpone urination, burning /pain with urination, get up at night to urinate, leakage of urine, stream starts and stops, trouble starting your stream, have to strain to urinate, and being pregnant.  Gastrointestinal (Upper):   Patient denies nausea, vomiting, and indigestion/ heartburn.  Gastrointestinal (Lower):   Patient denies diarrhea and constipation.  Constitutional:   Patient denies fever, night sweats, weight loss, and fatigue.  Skin:   Patient denies skin rash/ lesion and itching.  Eyes:   Patient denies blurred vision and double vision.  Ears/ Nose/ Throat:   Patient denies sore throat and sinus problems.  Hematologic/Lymphatic:   Patient denies swollen glands and easy bruising.  Cardiovascular:   Patient denies leg swelling and chest pains.  Respiratory:   Patient denies cough and shortness of breath.  Endocrine:   Patient denies excessive thirst.  Musculoskeletal:   Patient denies joint pain and back pain.  Neurological:   Patient denies headaches and dizziness.  Psychologic:   Patient denies depression and anxiety.   VITAL SIGNS:      03/25/2018 10:04 AM  Weight 227 lb / 102.97 kg  Height 61 in / 154.94 cm  BP 125/90 mmHg  Pulse 71 /min  Temperature 97.7 F / 36.5 C  BMI 42.9 kg/m  MULTI-SYSTEM PHYSICAL EXAMINATION:    Constitutional: Obese. No physical deformities. Normally developed. Good grooming.   Neck: Neck symmetrical, not swollen. Normal tracheal position.  Respiratory: No labored breathing, no use of accessory muscles. CTA  Cardiovascular: Normal temperature, RRR without murmur, no swelling, no varicosities.   Lymphatic: No enlargement, no tenderness of supraclavicular, neck lymph nodes.  Skin: No paleness, no jaundice, no cyanosis. No lesion, no ulcer, no rash.  Neurologic /  Psychiatric: Oriented to time, oriented to place, oriented to person. No depression, no anxiety, no agitation.  Gastrointestinal: Obese abdomen. No mass, no tenderness, no rigidity.   Musculoskeletal: Normal gait and station of head and neck.     PAST DATA REVIEWED:  Source Of History:  Patient  Lab Test Review:   BMP, CBC with Diff  Records Review:   Previous Hospital Records  Urine Test Review:   Urinalysis, Urine Culture  X-Ray Review: KUB: Reviewed Films. Discussed With Patient.  C.T. Stone Protocol: Reviewed Films. Reviewed Report. Discussed With Patient.    Notes:                     Er records and labs reviewed. She had e. coli on her culture on 9/17 but the urine culture was negative on 9/20 on macrobid.    PROCEDURES:         KUB - [id number]  A single view of the abdomen is obtained. There is a 7x16mm stone in the left proximal ureter just above the sacrum. No bone, gas or soft tissue abnormalities are noted.                Urinalysis w/Scope Dipstick Dipstick Cont'd Micro  Color: Yellow Bilirubin: Neg mg/dL WBC/hpf: 0 - 5/hpf  Appearance: Cloudy Ketones: Neg mg/dL RBC/hpf: >60/hpf  Specific Gravity: 1.010 Blood: 3+ ery/uL Bacteria: NS (Not Seen)  pH: 7.5 Protein: 1+ mg/dL Cystals: NS (Not Seen)  Glucose: Neg mg/dL Urobilinogen: 0.2 mg/dL Casts: NS (Not Seen)    Nitrites: Neg Trichomonas: Not Present    Leukocyte Esterase: Neg leu/uL Mucous: Not Present      Epithelial Cells: 0 - 5/hpf      Yeast: NS (Not Seen)      Sperm: Not Present    ASSESSMENT:      ICD-10 Details  1 GU:   Ureteral calculus - N20.1 She has a 13x25mm left proximal stone and a bad experience with a stent. I am going to get her set up for ESWL and reviewed the risks of the procedure. I reviewed the risks of ESWL including bleeding, infection, injury to the kidney or adjacent structures, failure to fragment the stone, need for ancillary procedures, thrombotic events, cardiac arrhythmias and sedation  complications.   2   Personal Hx Urinary Tract Infections - Z87.440 Her UA has only blood today on macrobid but I am going to switch her to keflex for better tissue penetration prior to ESWL.    PLAN:            Medications New Meds: Keflex 500 mg capsule 1 capsule PO TID   #21  0 Refill(s)            Orders X-Rays: KUB          Schedule Return Visit/Planned Activity: ASAP - Schedule Surgery             Note: ESWL  After a thorough review of the management options for the patient's condition the patient  elected to  proceed with surgical therapy as noted above. We have discussed the potential benefits and risks of the procedure, side effects of the proposed treatment, the likelihood of the patient achieving the goals of the procedure, and any potential problems that might occur during the procedure or recuperation. Informed consent has been obtained.

## 2018-03-27 NOTE — Progress Notes (Deleted)
Subjective:    Patient ID: [name], female    DOB: [birth year], 32 y.o.   MRN: [id number]  HPI:  [name] is here for CPE  Healthcare Maintenance: PAP- Mammogram- Immunizations-  Patient Care Team    Relationship Specialty Notifications Start End  Esaw Grandchild, NP PCP - General Family Medicine  01/23/17     Patient Active Problem List   Diagnosis Date Noted  . Acute cystitis without hematuria 03/05/2018  . Rhinitis, chronic 04/26/2017  . Poor nutrition 04/03/2017  . Intractable migraine with aura with status migrainosus 04/03/2017  . Wound of right breast 03/29/2017  . Healthcare maintenance 03/29/2017  . HSV (herpes simplex virus) infection 03/29/2017  . Exposure to STD 02/08/2017  . Obesity, Class III, BMI 40-49.9 (morbid obesity) ([place]) 01/31/2017  . Bipolar 1 disorder ([place]) 01/31/2017  . Esophageal reflux   . History of gestational diabetes 06/10/2015     Past Medical History:  Diagnosis Date  . Anemia    after pregnancy  . Bipolar 1 disorder ([place])   . Blood dyscrasia    takes 11 1/2 minutes to clot - as of 01/20/16 no longer seems to have this problem  . Depression   . GERD (gastroesophageal reflux disease)    with pregnancy  . Gestational diabetes mellitus (GDM), antepartum   . Heart murmur    states she outgrew it  . History of IBS    in high school  . Kidney stones   . Migraine   . Uterine fibroid      Past Surgical History:  Procedure Laterality Date  . CESAREAN SECTION     Nov 2011  . CESAREAN SECTION WITH BILATERAL TUBAL LIGATION Bilateral 07/26/2015   Procedure: C section ;  Surgeon: Mora Bellman, MD;  Location: [facility];  Service: Obstetrics;  Laterality: Bilateral;  . RENAL ARTERY STENT     placed and removed in 2005 - stents for kidney stones  . TOOTH EXTRACTION N/A 01/21/2016   Procedure: DENTAL RESTORATION/EXTRACTIONS;  Surgeon: Diona Browner, DDS;  Location: [facility];  Service: Oral Surgery;  Laterality: N/A;  . TUBAL LIGATION  Bilateral 07/26/2015   Procedure: BILATERAL TUBAL LIGATION;  Surgeon: Mora Bellman, MD;  Location: [facility];  Service: Obstetrics;  Laterality: Bilateral;     Family History  Problem Relation Age of Onset  . Hypertension Mother   . Depression Mother   . Diabetes Father   . Hypertension Father   . Heart disease Father   . Cancer Father        foot     Social History   Substance and Sexual Activity  Drug Use No     Social History   Substance and Sexual Activity  Alcohol Use Yes  . Alcohol/week: 0.0 standard drinks   Comment: occ     Social History   Tobacco Use  Smoking Status Never Smoker  Smokeless Tobacco Never Used     Outpatient Encounter Medications as of 03/28/2018  Medication Sig  . Aspirin-Acetaminophen-Caffeine (EXCEDRIN MIGRAINE PO) Take 2 tablets by mouth 4 (four) times daily as needed (MIGRAINE).  . Chlorpheniramine-Phenylephrine (SINUS & ALLERGY PO) Take 4 tablets by mouth daily as needed (ALLERGIES).  Marland Kitchen FLUoxetine (PROZAC) 20 MG capsule Take 20 mg by mouth at bedtime.  Marland Kitchen ibuprofen (ADVIL,MOTRIN) 200 MG tablet Take 800 mg by mouth every 6 (six) hours as needed for headache, mild pain or moderate pain.  . montelukast (SINGULAIR) 10 MG tablet Take 1 tablet (10  mg total) by mouth at bedtime. (Patient not taking: Reported on 03/05/2018)  . nitrofurantoin, macrocrystal-monohydrate, (MACROBID) 100 MG capsule Take 1 capsule (100 mg total) by mouth 2 (two) times daily.  Marland Kitchen OLANZapine (ZYPREXA) 7.5 MG tablet Take 7.5 mg by mouth at bedtime.  . ondansetron (ZOFRAN-ODT) 8 MG disintegrating tablet Take 1 tablet (8 mg total) by mouth every 8 (eight) hours as needed for nausea or vomiting.  Marland Kitchen Phenylephrine HCl (SINEX ULTRA FINE MIST REGULAR NA) Place 2 sprays into the nose daily as needed (ALLERGIES).  Marland Kitchen topiramate (TOPAMAX) 25 MG tablet Take 1 tablet (25 mg total) by mouth daily. (Patient not taking: Reported on 03/05/2018)  . traMADol (ULTRAM) 50 MG tablet Take 1  tablet (50 mg total) by mouth every 8 (eight) hours as needed.   Facility-Administered Encounter Medications as of 03/28/2018  Medication  . cephALEXin (KEFLEX) capsule 500 mg    Allergies: Erythromycin; Latex; Food; and Ciprofloxacin  There is no height or weight on file to calculate BMI.  There were no vitals taken for this visit.     Review of Systems     Objective:   Physical Exam        Assessment & Plan:  No diagnosis found.  No problem-specific Assessment & Plan notes found for this encounter.    FOLLOW-UP:  No follow-ups on file.

## 2018-03-28 ENCOUNTER — Ambulatory Visit (HOSPITAL_COMMUNITY)
Admission: RE | Admit: 2018-03-28 | Discharge: 2018-03-28 | Disposition: A | Discharge status: Home / Self Care | Payer: 59 | Source: Ambulatory Visit | Attending: Urology | Admitting: Urology

## 2018-03-28 ENCOUNTER — Encounter (HOSPITAL_COMMUNITY)
Admission: RE | Disposition: A | Discharge status: Home / Self Care | Payer: Self-pay | Source: Ambulatory Visit | Attending: Urology

## 2018-03-28 ENCOUNTER — Ambulatory Visit (HOSPITAL_COMMUNITY): Payer: 59

## 2018-03-28 ENCOUNTER — Encounter: Payer: 59 | Admitting: Adult Health

## 2018-03-28 ENCOUNTER — Encounter (HOSPITAL_COMMUNITY): Payer: Self-pay | Admitting: General Practice

## 2018-03-28 DIAGNOSIS — E669 Obesity, unspecified: Secondary | ICD-10-CM | POA: Diagnosis not present

## 2018-03-28 DIAGNOSIS — N202 Calculus of kidney with calculus of ureter: Secondary | ICD-10-CM | POA: Diagnosis not present

## 2018-03-28 DIAGNOSIS — Z6841 Body Mass Index (BMI) 40.0 and over, adult: Secondary | ICD-10-CM | POA: Insufficient documentation

## 2018-03-28 DIAGNOSIS — N201 Calculus of ureter: Secondary | ICD-10-CM | POA: Diagnosis not present

## 2018-03-28 DIAGNOSIS — F319 Bipolar disorder, unspecified: Secondary | ICD-10-CM | POA: Diagnosis not present

## 2018-03-28 DIAGNOSIS — Z79899 Other long term (current) drug therapy: Secondary | ICD-10-CM | POA: Diagnosis not present

## 2018-03-28 HISTORY — DX: Personal history of urinary calculi: Z87.442

## 2018-03-28 HISTORY — PX: EXTRACORPOREAL SHOCK WAVE LITHOTRIPSY: SHX1557

## 2018-03-28 LAB — PREGNANCY, URINE: PREG TEST UR: NEGATIVE

## 2018-03-28 SURGERY — LITHOTRIPSY, ESWL
Anesthesia: LOCAL | Laterality: Left

## 2018-03-28 MED ORDER — SODIUM CHLORIDE 0.9 % IV SOLN
INTRAVENOUS | Status: DC
  Administered 2018-03-28: 07:00:00 via INTRAVENOUS

## 2018-03-28 MED ORDER — DIAZEPAM 5 MG PO TABS
10.0000 mg | ORAL_TABLET | ORAL | Status: AC
  Administered 2018-03-28: 10 mg via ORAL
  Filled 2018-03-28: qty 2

## 2018-03-28 MED ORDER — DIPHENHYDRAMINE HCL 25 MG PO CAPS
25.0000 mg | ORAL_CAPSULE | ORAL | Status: AC
  Administered 2018-03-28: 25 mg via ORAL
  Filled 2018-03-28: qty 1

## 2018-03-28 NOTE — Interval H&P Note (Signed)
History and Physical Interval Note:  03/28/2018 7:32 AM  [name]  has presented today for surgery, with the diagnosis of LEFT PROXIMAL STONE  The various methods of treatment have been discussed with the patient and family. After consideration of risks, benefits and other options for treatment, the patient has consented to  Procedure(s): LEFT EXTRACORPOREAL SHOCK WAVE LITHOTRIPSY (ESWL) (Left) as a surgical intervention .  The patient's history has been reviewed, patient examined, no change in status, stable for surgery.  I have reviewed the patient's chart and labs.  Questions were answered to the patient's satisfaction.     [name]

## 2018-03-29 ENCOUNTER — Encounter (HOSPITAL_COMMUNITY): Payer: Self-pay | Admitting: Urology

## 2018-04-11 DIAGNOSIS — N201 Calculus of ureter: Secondary | ICD-10-CM | POA: Diagnosis not present

## 2018-05-02 DIAGNOSIS — N201 Calculus of ureter: Secondary | ICD-10-CM | POA: Diagnosis not present

## 2018-05-20 NOTE — Progress Notes (Deleted)
Subjective:    Patient ID: [name], female    DOB: 09/13/1985, 32 y.o.   MRN: [id number]  HPI:  [name] is here for CPE  Healthcare Maintenance: PAP- Mammogram-N/a Immunizations-  Patient Care Team    Relationship Specialty Notifications Start End  Esaw Grandchild, NP PCP - General Family Medicine  01/23/17     Patient Active Problem List   Diagnosis Date Noted  . Acute cystitis without hematuria 03/05/2018  . Rhinitis, chronic 04/26/2017  . Poor nutrition 04/03/2017  . Intractable migraine with aura with status migrainosus 04/03/2017  . Wound of right breast 03/29/2017  . Healthcare maintenance 03/29/2017  . HSV (herpes simplex virus) infection 03/29/2017  . Exposure to STD 02/08/2017  . Obesity, Class III, BMI 40-49.9 (morbid obesity) ([place]) 01/31/2017  . Bipolar 1 disorder ([place]) 01/31/2017  . Esophageal reflux   . History of gestational diabetes 06/10/2015     Past Medical History:  Diagnosis Date  . Anemia    after pregnancy  . Bipolar 1 disorder ([place])   . Blood dyscrasia    takes 11 1/2 minutes to clot - as of 01/20/16 no longer seems to have this problem  . Depression   . GERD (gastroesophageal reflux disease)    with pregnancy  . Gestational diabetes mellitus (GDM), antepartum   . Heart murmur    states she outgrew it  . History of IBS    in high school  . History of kidney stones   . Kidney stones   . Migraine   . Uterine fibroid      Past Surgical History:  Procedure Laterality Date  . CESAREAN SECTION     Nov 2011  . CESAREAN SECTION WITH BILATERAL TUBAL LIGATION Bilateral 07/26/2015   Procedure: C section ;  Surgeon: Mora Bellman, MD;  Location: [facility];  Service: Obstetrics;  Laterality: Bilateral;  . EXTRACORPOREAL SHOCK WAVE LITHOTRIPSY Left 03/28/2018   Procedure: LEFT EXTRACORPOREAL SHOCK WAVE LITHOTRIPSY (ESWL);  Surgeon: Bjorn Loser, MD;  Location: [facility];  Service: Urology;  Laterality: Left;  . RENAL ARTERY STENT     placed and removed in 2005 - stents for kidney stones  . TOOTH EXTRACTION N/A 01/21/2016   Procedure: DENTAL RESTORATION/EXTRACTIONS;  Surgeon: Diona Browner, DDS;  Location: [facility];  Service: Oral Surgery;  Laterality: N/A;  . TUBAL LIGATION Bilateral 07/26/2015   Procedure: BILATERAL TUBAL LIGATION;  Surgeon: Mora Bellman, MD;  Location: [facility];  Service: Obstetrics;  Laterality: Bilateral;     Family History  Problem Relation Age of Onset  . Hypertension Mother   . Depression Mother   . Diabetes Father   . Hypertension Father   . Heart disease Father   . Cancer Father        foot     Social History   Substance and Sexual Activity  Drug Use No     Social History   Substance and Sexual Activity  Alcohol Use Yes  . Alcohol/week: 0.0 standard drinks   Comment: occ     Social History   Tobacco Use  Smoking Status Never Smoker  Smokeless Tobacco Never Used     Outpatient Encounter Medications as of 05/22/2018  Medication Sig  . Aspirin-Acetaminophen-Caffeine (EXCEDRIN MIGRAINE PO) Take 2 tablets by mouth 4 (four) times daily as needed (MIGRAINE).  . Chlorpheniramine-Phenylephrine (SINUS & ALLERGY PO) Take 4 tablets by mouth daily as needed (ALLERGIES).  Marland Kitchen FLUoxetine (PROZAC) 20 MG capsule Take 20 mg by mouth  at bedtime.  Marland Kitchen ibuprofen (ADVIL,MOTRIN) 200 MG tablet Take 800 mg by mouth every 6 (six) hours as needed for headache, mild pain or moderate pain.  . montelukast (SINGULAIR) 10 MG tablet Take 1 tablet (10 mg total) by mouth at bedtime. (Patient not taking: Reported on 03/05/2018)  . nitrofurantoin, macrocrystal-monohydrate, (MACROBID) 100 MG capsule Take 1 capsule (100 mg total) by mouth 2 (two) times daily.  Marland Kitchen OLANZapine (ZYPREXA) 7.5 MG tablet Take 7.5 mg by mouth at bedtime.  . ondansetron (ZOFRAN-ODT) 8 MG disintegrating tablet Take 1 tablet (8 mg total) by mouth every 8 (eight) hours as needed for nausea or vomiting.  Marland Kitchen Phenylephrine HCl (SINEX ULTRA FINE MIST  REGULAR NA) Place 2 sprays into the nose daily as needed (ALLERGIES).  Marland Kitchen topiramate (TOPAMAX) 25 MG tablet Take 1 tablet (25 mg total) by mouth daily.  . traMADol (ULTRAM) 50 MG tablet Take 1 tablet (50 mg total) by mouth every 8 (eight) hours as needed.   Facility-Administered Encounter Medications as of 05/22/2018  Medication  . cephALEXin (KEFLEX) capsule 500 mg    Allergies: Erythromycin; Latex; Food; and Ciprofloxacin  There is no height or weight on file to calculate BMI.  There were no vitals taken for this visit.     Review of Systems     Objective:   Physical Exam        Assessment & Plan:  No diagnosis found.  No problem-specific Assessment & Plan notes found for this encounter.    FOLLOW-UP:  No follow-ups on file.

## 2018-05-22 ENCOUNTER — Encounter: Payer: 59 | Admitting: Adult Health

## 2018-11-18 IMAGING — CT CT RENAL STONE PROTOCOL
2 of 4 series · 16 of 46 positions shown, 18 images · non-contrast
Comparison: CT scan of May 26, 2017.

CLINICAL DATA: Bilateral flank pain.

EXAM:
CT ABDOMEN AND PELVIS WITHOUT CONTRAST
TECHNIQUE: Multidetector CT imaging of the abdomen and pelvis was performed
following the standard protocol without IV contrast.

[Series 3: renal stone 5.0 · axial · 0.71mm/px · z∈[+615,+1055]mm · 13 of 98 slices shown, 15 images]
[im 5/98  soft-tissue]
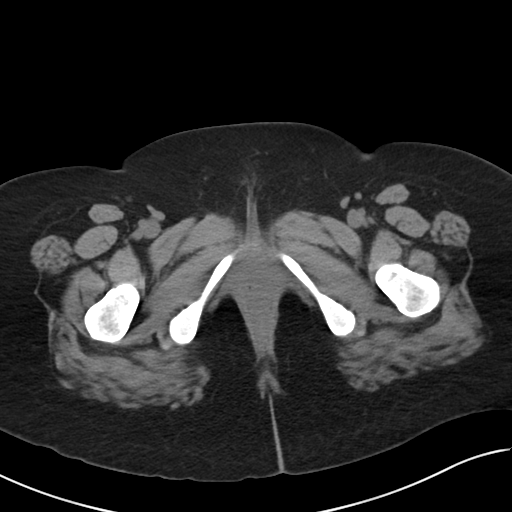
[im 5/98  bone]
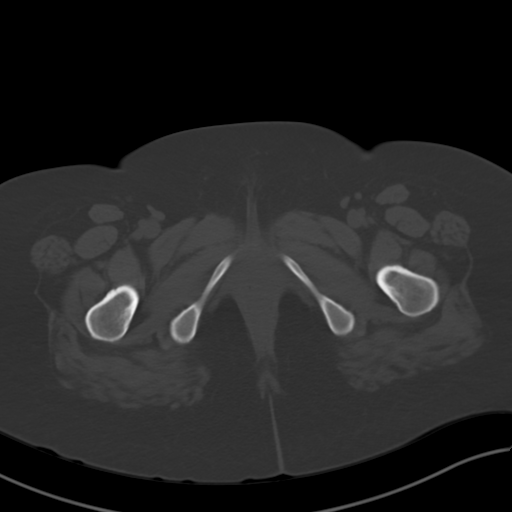
[im 13/98  soft-tissue]
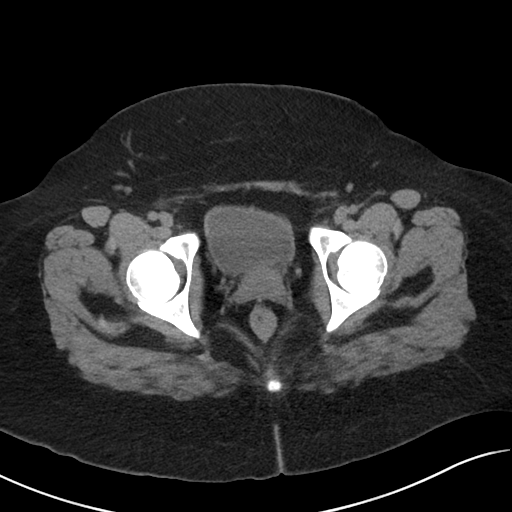
[im 21/98  soft-tissue]
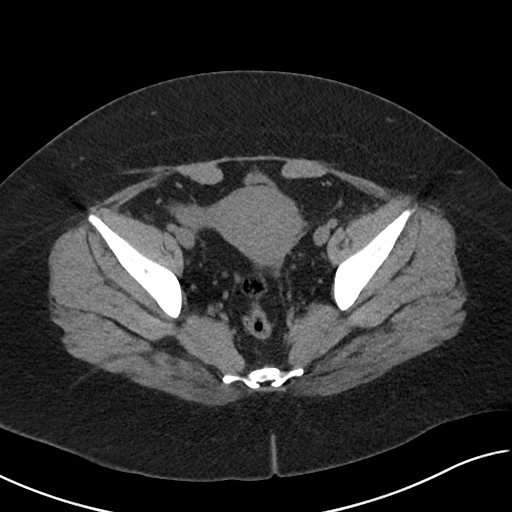
[im 29/98  soft-tissue]
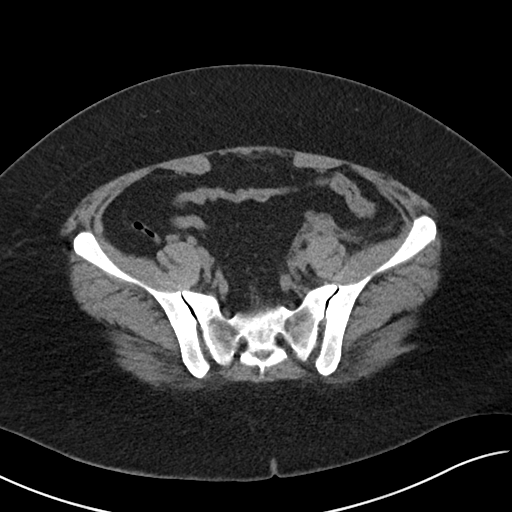
[im 33/98  soft-tissue]
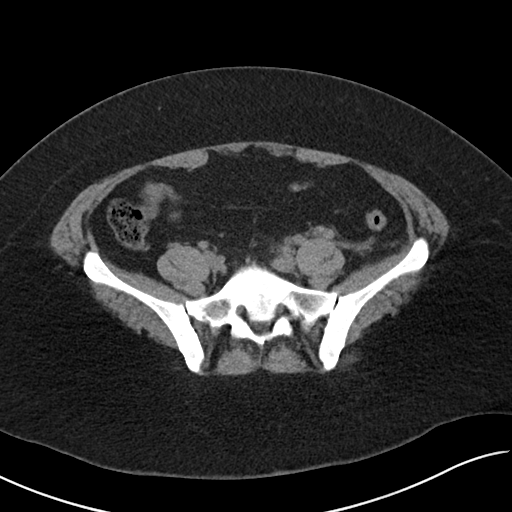
[im 41/98  soft-tissue]
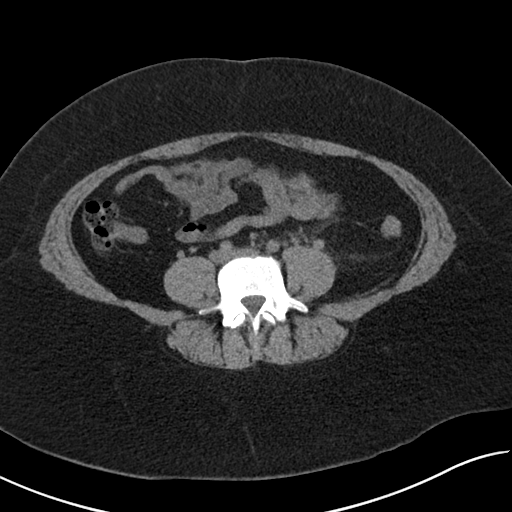
[im 49/98  soft-tissue]
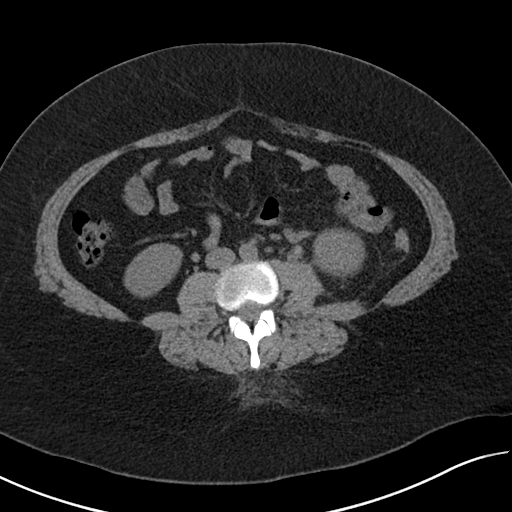
[im 57/98  soft-tissue]
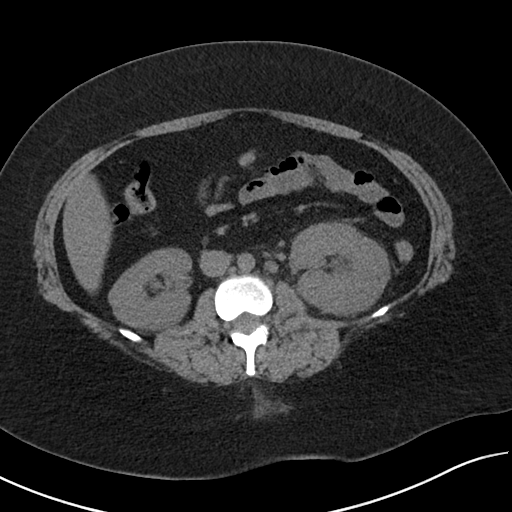
[im 65/98  soft-tissue]
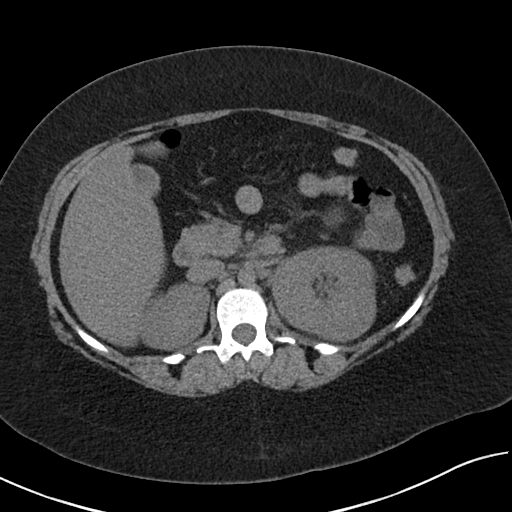
[im 65/98  bone]
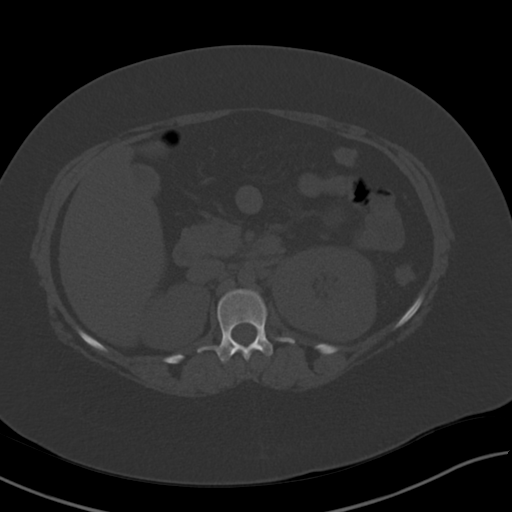
[im 69/98  soft-tissue]
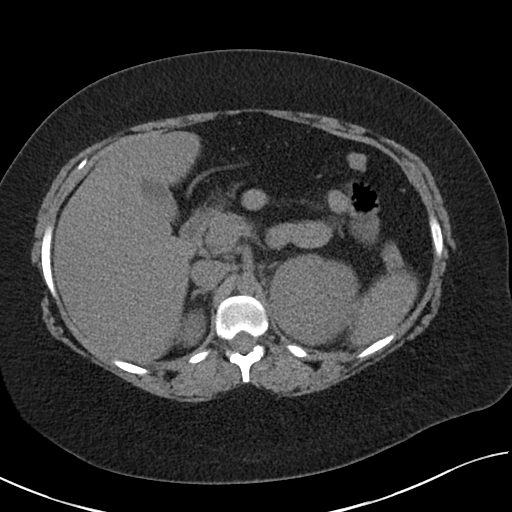
[im 77/98  soft-tissue]
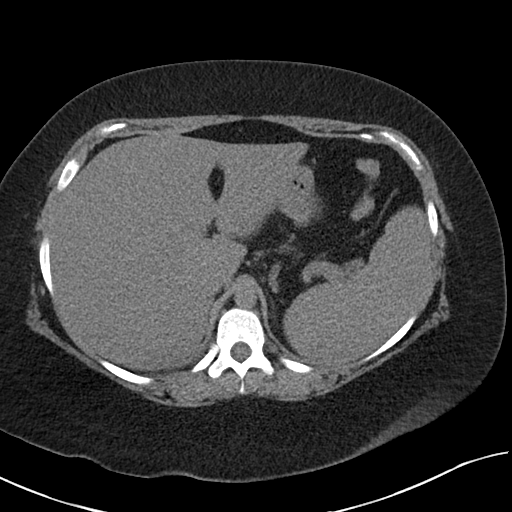
[im 85/98  soft-tissue]
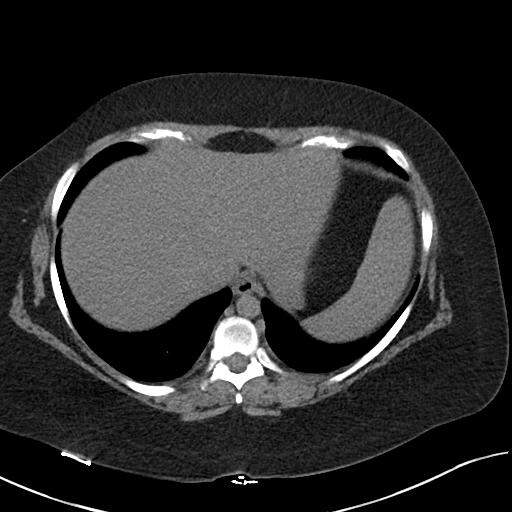
[im 93/98  soft-tissue]
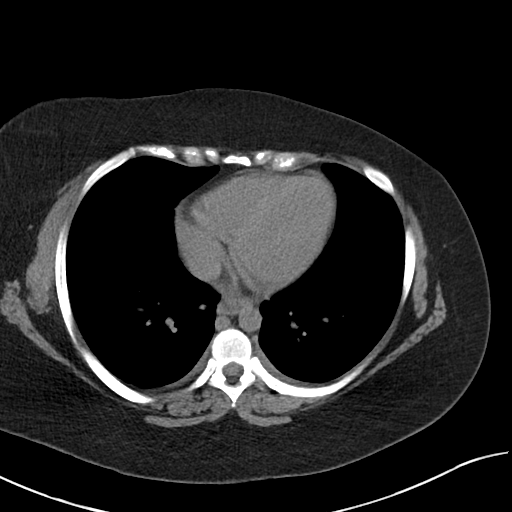

[Series 5: renal stone 3.0 cor · coronal · 0.87mm/px · 3 of 101 slices shown]
[im 34/101  soft-tissue]
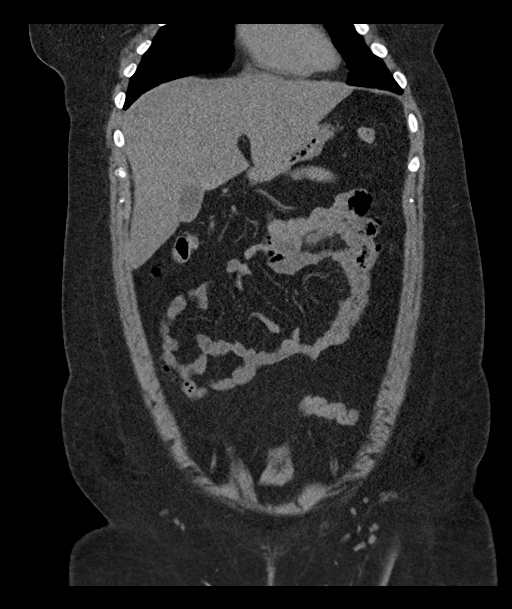
[im 45/101  soft-tissue]
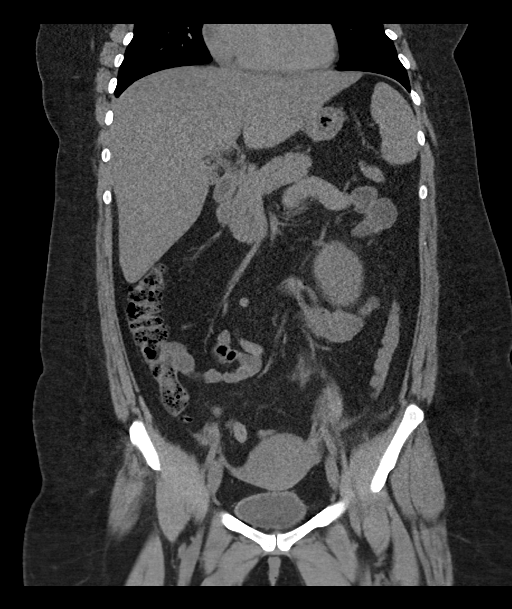
[im 56/101  soft-tissue]
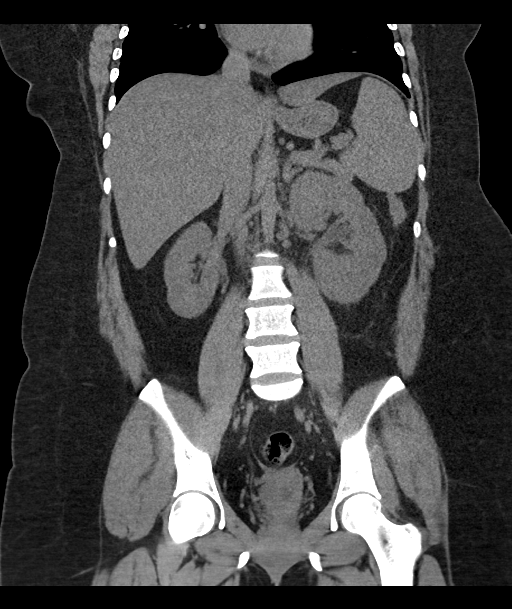

[16 of 46 positions shown; findings below may reference images not displayed]

FINDINGS: Lower chest: No acute abnormality.

Hepatobiliary: No gallstones are noted. No biliary dilatation is
noted. Fatty infiltration of the liver is noted.

Pancreas: Unremarkable. No pancreatic ductal dilatation or
surrounding inflammatory changes.

Spleen: Mild splenomegaly is noted.

Adrenals/Urinary Tract: Adrenal glands appear normal. Small
nonobstructive right renal calculus is noted. Minimal left
hydronephrosis with minimal perinephric stranding is noted secondary
to 12 mm calculus in proximal left ureter. Urinary bladder is
unremarkable.

Stomach/Bowel: Stomach is within normal limits. Appendix appears
normal. No evidence of bowel wall thickening, distention, or
inflammatory changes.

Vascular/Lymphatic: No significant vascular findings are present. No
enlarged abdominal or pelvic lymph nodes.

Reproductive: Uterus and bilateral adnexa are unremarkable.

Other: No abdominal wall hernia or abnormality. No abdominopelvic
ascites.

Musculoskeletal: No acute or significant osseous findings.
IMPRESSION: Minimal left hydronephrosis with minimal perinephric stranding is
noted secondary to 12 mm calculus in proximal left ureter.
Nonobstructive right renal calculus is noted.

Fatty infiltration of the liver.

Mild splenomegaly.

## 2018-12-19 ENCOUNTER — Encounter (HOSPITAL_COMMUNITY): Payer: Self-pay

## 2018-12-19 ENCOUNTER — Other Ambulatory Visit: Payer: Self-pay

## 2018-12-19 ENCOUNTER — Emergency Department (HOSPITAL_COMMUNITY): Payer: 59

## 2018-12-19 ENCOUNTER — Inpatient Hospital Stay (HOSPITAL_COMMUNITY)
Admission: EM | Admit: 2018-12-19 | Discharge: 2018-12-28 | DRG: 551 | Disposition: A | Discharge status: Rehabilitation Facility | Payer: 59 | Attending: Surgery | Admitting: Surgery

## 2018-12-19 DIAGNOSIS — Z881 Allergy status to other antibiotic agents status: Secondary | ICD-10-CM

## 2018-12-19 DIAGNOSIS — S22078A Other fracture of T9-T10 vertebra, initial encounter for closed fracture: Secondary | ICD-10-CM | POA: Diagnosis present

## 2018-12-19 DIAGNOSIS — S92001A Unspecified fracture of right calcaneus, initial encounter for closed fracture: Secondary | ICD-10-CM | POA: Diagnosis present

## 2018-12-19 DIAGNOSIS — S22058A Other fracture of T5-T6 vertebra, initial encounter for closed fracture: Secondary | ICD-10-CM | POA: Diagnosis present

## 2018-12-19 DIAGNOSIS — N200 Calculus of kidney: Secondary | ICD-10-CM | POA: Diagnosis present

## 2018-12-19 DIAGNOSIS — S82409A Unspecified fracture of shaft of unspecified fibula, initial encounter for closed fracture: Secondary | ICD-10-CM

## 2018-12-19 DIAGNOSIS — K5909 Other constipation: Secondary | ICD-10-CM | POA: Diagnosis not present

## 2018-12-19 DIAGNOSIS — S32059A Unspecified fracture of fifth lumbar vertebra, initial encounter for closed fracture: Secondary | ICD-10-CM | POA: Diagnosis present

## 2018-12-19 DIAGNOSIS — Z1159 Encounter for screening for other viral diseases: Secondary | ICD-10-CM

## 2018-12-19 DIAGNOSIS — S36115A Moderate laceration of liver, initial encounter: Secondary | ICD-10-CM | POA: Diagnosis present

## 2018-12-19 DIAGNOSIS — Y92414 Local residential or business street as the place of occurrence of the external cause: Secondary | ICD-10-CM | POA: Diagnosis not present

## 2018-12-19 DIAGNOSIS — D6800 Von Willebrand disease, unspecified: Secondary | ICD-10-CM

## 2018-12-19 DIAGNOSIS — Z91040 Latex allergy status: Secondary | ICD-10-CM

## 2018-12-19 DIAGNOSIS — E46 Unspecified protein-calorie malnutrition: Secondary | ICD-10-CM | POA: Diagnosis not present

## 2018-12-19 DIAGNOSIS — S2231XA Fracture of one rib, right side, initial encounter for closed fracture: Secondary | ICD-10-CM | POA: Diagnosis present

## 2018-12-19 DIAGNOSIS — R52 Pain, unspecified: Secondary | ICD-10-CM | POA: Diagnosis not present

## 2018-12-19 DIAGNOSIS — K5900 Constipation, unspecified: Secondary | ICD-10-CM | POA: Diagnosis not present

## 2018-12-19 DIAGNOSIS — S2232XA Fracture of one rib, left side, initial encounter for closed fracture: Secondary | ICD-10-CM | POA: Diagnosis present

## 2018-12-19 DIAGNOSIS — S2249XA Multiple fractures of ribs, unspecified side, initial encounter for closed fracture: Secondary | ICD-10-CM

## 2018-12-19 DIAGNOSIS — S36113A Laceration of liver, unspecified degree, initial encounter: Secondary | ICD-10-CM | POA: Diagnosis not present

## 2018-12-19 DIAGNOSIS — R402412 Glasgow coma scale score 13-15, at arrival to emergency department: Secondary | ICD-10-CM | POA: Diagnosis present

## 2018-12-19 DIAGNOSIS — D62 Acute posthemorrhagic anemia: Secondary | ICD-10-CM | POA: Diagnosis present

## 2018-12-19 DIAGNOSIS — M7989 Other specified soft tissue disorders: Secondary | ICD-10-CM | POA: Diagnosis not present

## 2018-12-19 DIAGNOSIS — K5903 Drug induced constipation: Secondary | ICD-10-CM | POA: Diagnosis not present

## 2018-12-19 DIAGNOSIS — T07XXXA Unspecified multiple injuries, initial encounter: Secondary | ICD-10-CM

## 2018-12-19 DIAGNOSIS — S22068A Other fracture of T7-T8 thoracic vertebra, initial encounter for closed fracture: Secondary | ICD-10-CM | POA: Diagnosis present

## 2018-12-19 DIAGNOSIS — S82831D Other fracture of upper and lower end of right fibula, subsequent encounter for closed fracture with routine healing: Secondary | ICD-10-CM | POA: Diagnosis not present

## 2018-12-19 DIAGNOSIS — M25371 Other instability, right ankle: Secondary | ICD-10-CM | POA: Diagnosis not present

## 2018-12-19 DIAGNOSIS — S22009A Unspecified fracture of unspecified thoracic vertebra, initial encounter for closed fracture: Secondary | ICD-10-CM

## 2018-12-19 DIAGNOSIS — S36892A Contusion of other intra-abdominal organs, initial encounter: Secondary | ICD-10-CM | POA: Diagnosis present

## 2018-12-19 DIAGNOSIS — J90 Pleural effusion, not elsewhere classified: Secondary | ICD-10-CM | POA: Diagnosis present

## 2018-12-19 DIAGNOSIS — F4310 Post-traumatic stress disorder, unspecified: Secondary | ICD-10-CM | POA: Diagnosis not present

## 2018-12-19 DIAGNOSIS — S93491A Sprain of other ligament of right ankle, initial encounter: Secondary | ICD-10-CM | POA: Diagnosis present

## 2018-12-19 DIAGNOSIS — E876 Hypokalemia: Secondary | ICD-10-CM | POA: Diagnosis present

## 2018-12-19 DIAGNOSIS — S36030A Superficial (capsular) laceration of spleen, initial encounter: Secondary | ICD-10-CM | POA: Diagnosis present

## 2018-12-19 DIAGNOSIS — S82401A Unspecified fracture of shaft of right fibula, initial encounter for closed fracture: Secondary | ICD-10-CM | POA: Diagnosis not present

## 2018-12-19 DIAGNOSIS — S32009A Unspecified fracture of unspecified lumbar vertebra, initial encounter for closed fracture: Secondary | ICD-10-CM

## 2018-12-19 DIAGNOSIS — S36039A Unspecified laceration of spleen, initial encounter: Secondary | ICD-10-CM

## 2018-12-19 DIAGNOSIS — S82831A Other fracture of upper and lower end of right fibula, initial encounter for closed fracture: Secondary | ICD-10-CM | POA: Diagnosis present

## 2018-12-19 DIAGNOSIS — E872 Acidosis: Secondary | ICD-10-CM | POA: Diagnosis present

## 2018-12-19 DIAGNOSIS — G8918 Other acute postprocedural pain: Secondary | ICD-10-CM | POA: Diagnosis not present

## 2018-12-19 HISTORY — DX: Disorder of kidney and ureter, unspecified: N28.9

## 2018-12-19 LAB — I-STAT CHEM 8, ED
BUN: 11 mg/dL (ref 6–20)
Calcium, Ion: 1.1 mmol/L — ABNORMAL LOW (ref 1.15–1.40)
Chloride: 110 mmol/L (ref 98–111)
Creatinine, Ser: 1.1 mg/dL — ABNORMAL HIGH (ref 0.44–1.00)
Glucose, Bld: 203 mg/dL — ABNORMAL HIGH (ref 70–99)
HCT: 36 % (ref 36.0–46.0)
Hemoglobin: 12.2 g/dL (ref 12.0–15.0)
Potassium: 3.3 mmol/L — ABNORMAL LOW (ref 3.5–5.1)
Sodium: 141 mmol/L (ref 135–145)
TCO2: 18 mmol/L — ABNORMAL LOW (ref 22–32)

## 2018-12-19 LAB — COMPREHENSIVE METABOLIC PANEL
ALT: 67 U/L — ABNORMAL HIGH (ref 0–44)
AST: 73 U/L — ABNORMAL HIGH (ref 15–41)
Albumin: 3.3 g/dL — ABNORMAL LOW (ref 3.5–5.0)
Alkaline Phosphatase: 88 U/L (ref 38–126)
Anion gap: 16 — ABNORMAL HIGH (ref 5–15)
BUN: 10 mg/dL (ref 6–20)
CO2: 16 mmol/L — ABNORMAL LOW (ref 22–32)
Calcium: 8.8 mg/dL — ABNORMAL LOW (ref 8.9–10.3)
Chloride: 110 mmol/L (ref 98–111)
Creatinine, Ser: 1.23 mg/dL — ABNORMAL HIGH (ref 0.44–1.00)
GFR calc Af Amer: >60 mL/min (ref >60)
GFR calc non Af Amer: 58 mL/min — ABNORMAL LOW (ref >60)
Glucose, Bld: 214 mg/dL — ABNORMAL HIGH (ref 70–99)
Potassium: 3.2 mmol/L — ABNORMAL LOW (ref 3.5–5.1)
Sodium: 142 mmol/L (ref 135–145)
Total Bilirubin: 0.5 mg/dL (ref 0.3–1.2)
Total Protein: 6.9 g/dL (ref 6.5–8.1)

## 2018-12-19 LAB — CBC
HCT: 36.2 % (ref 36.0–46.0)
Hemoglobin: 11 g/dL — ABNORMAL LOW (ref 12.0–15.0)
MCH: 27.1 pg (ref 26.0–34.0)
MCHC: 30.4 g/dL (ref 30.0–36.0)
MCV: 89.2 fL (ref 80.0–100.0)
Platelets: 639 10*3/uL — ABNORMAL HIGH (ref 150–400)
RBC: 4.06 MIL/uL (ref 3.87–5.11)
RDW: 15.1 % (ref 11.5–15.5)
WBC: 29.9 10*3/uL — ABNORMAL HIGH (ref 4.0–10.5)
nRBC: 0 % (ref 0.0–0.2)

## 2018-12-19 LAB — LACTIC ACID, PLASMA: Lactic Acid, Venous: 7.8 mmol/L (ref 0.5–1.9)

## 2018-12-19 LAB — I-STAT BETA HCG BLOOD, ED (MC, WL, AP ONLY): I-stat hCG, quantitative: <5 m[IU]/mL (ref <5)

## 2018-12-19 LAB — CDS SEROLOGY

## 2018-12-19 LAB — SARS CORONAVIRUS 2 BY RT PCR (HOSPITAL ORDER, PERFORMED IN ~~LOC~~ HOSPITAL LAB): SARS Coronavirus 2: NEGATIVE

## 2018-12-19 LAB — ETHANOL: Alcohol, Ethyl (B): <10 mg/dL (ref <10)

## 2018-12-19 LAB — PROTIME-INR
INR: 1 (ref 0.8–1.2)
Prothrombin Time: 13 seconds (ref 11.4–15.2)

## 2018-12-19 MED ORDER — DOCUSATE SODIUM 100 MG PO CAPS
100.0000 mg | ORAL_CAPSULE | Freq: Two times a day (BID) | ORAL | Status: DC
  Administered 2018-12-20 – 2018-12-28 (×15): 100 mg via ORAL
  Filled 2018-12-19 (×17): qty 1

## 2018-12-19 MED ORDER — SODIUM CHLORIDE 0.9 % IV SOLN: INTRAVENOUS | Status: DC | PRN

## 2018-12-19 MED ORDER — HYDROMORPHONE HCL 1 MG/ML IJ SOLN
0.5000 mg | INTRAMUSCULAR | Status: DC | PRN
  Administered 2018-12-20 – 2018-12-26 (×9): 0.5 mg via INTRAVENOUS
  Filled 2018-12-19 (×10): qty 1

## 2018-12-19 MED ORDER — IOHEXOL 300 MG/ML  SOLN
100.0000 mL | Freq: Once | INTRAMUSCULAR | Status: AC | PRN
  Administered 2018-12-19: 100 mL via INTRAVENOUS

## 2018-12-19 MED ORDER — ACETAMINOPHEN 325 MG PO TABS
650.0000 mg | ORAL_TABLET | Freq: Four times a day (QID) | ORAL | Status: DC
  Administered 2018-12-19 – 2018-12-23 (×14): 650 mg via ORAL
  Filled 2018-12-19 (×14): qty 2

## 2018-12-19 MED ORDER — OXYCODONE HCL 5 MG PO TABS
10.0000 mg | ORAL_TABLET | Freq: Four times a day (QID) | ORAL | Status: DC | PRN
  Administered 2018-12-20 – 2018-12-23 (×10): 10 mg via ORAL
  Filled 2018-12-19 (×11): qty 2

## 2018-12-19 MED ORDER — ONDANSETRON HCL 4 MG/2ML IJ SOLN
4.0000 mg | Freq: Four times a day (QID) | INTRAMUSCULAR | Status: DC | PRN
  Administered 2018-12-20 – 2018-12-25 (×2): 4 mg via INTRAVENOUS
  Filled 2018-12-19 (×2): qty 2

## 2018-12-19 MED ORDER — SODIUM CHLORIDE 0.9 % IV BOLUS
2000.0000 mL | Freq: Once | INTRAVENOUS | Status: AC
  Administered 2018-12-19: 2000 mL via INTRAVENOUS

## 2018-12-19 MED ORDER — LACTATED RINGERS IV SOLN
INTRAVENOUS | Status: DC
  Administered 2018-12-20 – 2018-12-21 (×5): via INTRAVENOUS

## 2018-12-19 MED ORDER — ONDANSETRON 4 MG PO TBDP
4.0000 mg | ORAL_TABLET | Freq: Four times a day (QID) | ORAL | Status: DC | PRN
  Administered 2018-12-21: 4 mg via ORAL
  Filled 2018-12-19 (×2): qty 1

## 2018-12-19 NOTE — ED Notes (Signed)
Portable at bedside 
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
## 2018-12-19 NOTE — Procedures (Signed)
Arterial Catheter Insertion Procedure Note [name] [id number] 09/10/1985  Procedure: Insertion of Arterial Catheter  Indications: Blood pressure monitoring  Procedure Details Consent: Risks of procedure as well as the alternatives and risks of each were explained to the (patient/caregiver).  Consent for procedure obtained. Time Out: Verified patient identification, verified procedure, site/side was marked, verified correct patient position, special equipment/implants available, medications/allergies/relevent history reviewed, required imaging and test results available.  Performed  Maximum sterile technique was used including antiseptics, cap, gloves, gown, hand hygiene, mask and sheet. Skin prep: Chlorhexidine; local anesthetic administered 20 gauge catheter was inserted into right radial artery using the Seldinger technique. ULTRASOUND GUIDANCE USED: NO Evaluation Blood flow good; BP tracing good. Complications: No apparent complications.   [name] 12/19/2018
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
## 2018-12-19 NOTE — ED Notes (Signed)
Unable to obtain a manual blood pressure after multiple attempts

## 2018-12-19 NOTE — ED Notes (Signed)
Dr Vanita Panda cleared c spine, c collar removed

## 2018-12-19 NOTE — ED Notes (Signed)
Pt satting at 99% on room air at this time

## 2018-12-20 ENCOUNTER — Encounter (HOSPITAL_COMMUNITY): Payer: Self-pay

## 2018-12-20 ENCOUNTER — Inpatient Hospital Stay (HOSPITAL_COMMUNITY): Payer: 59

## 2018-12-20 DIAGNOSIS — S82401A Unspecified fracture of shaft of right fibula, initial encounter for closed fracture: Secondary | ICD-10-CM

## 2018-12-20 LAB — CBC WITH DIFFERENTIAL/PLATELET
Abs Immature Granulocytes: 0.14 10*3/uL — ABNORMAL HIGH (ref 0.00–0.07)
Abs Immature Granulocytes: 0.06 10*3/uL (ref 0.00–0.07)
Basophils Absolute: 0 10*3/uL (ref 0.0–0.1)
Basophils Absolute: 0 10*3/uL (ref 0.0–0.1)
Basophils Relative: 0 %
Basophils Relative: 0 %
Eosinophils Absolute: 0 10*3/uL (ref 0.0–0.5)
Eosinophils Absolute: 0 10*3/uL (ref 0.0–0.5)
Eosinophils Relative: 0 %
Eosinophils Relative: 0 %
HCT: 25 % — ABNORMAL LOW (ref 36.0–46.0)
HCT: 23.1 % — ABNORMAL LOW (ref 36.0–46.0)
Hemoglobin: 7.8 g/dL — ABNORMAL LOW (ref 12.0–15.0)
Hemoglobin: 7.3 g/dL — ABNORMAL LOW (ref 12.0–15.0)
Immature Granulocytes: 1 %
Immature Granulocytes: 1 %
Lymphocytes Relative: 4 %
Lymphocytes Relative: 13 %
Lymphs Abs: 0.6 10*3/uL — ABNORMAL LOW (ref 0.7–4.0)
Lymphs Abs: 1.2 10*3/uL (ref 0.7–4.0)
MCH: 26.7 pg (ref 26.0–34.0)
MCH: 26.9 pg (ref 26.0–34.0)
MCHC: 31.2 g/dL (ref 30.0–36.0)
MCHC: 31.6 g/dL (ref 30.0–36.0)
MCV: 85.6 fL (ref 80.0–100.0)
MCV: 85.2 fL (ref 80.0–100.0)
Monocytes Absolute: 0.9 10*3/uL (ref 0.1–1.0)
Monocytes Absolute: 0.7 10*3/uL (ref 0.1–1.0)
Monocytes Relative: 5 %
Monocytes Relative: 7 %
Neutro Abs: 15.1 10*3/uL — ABNORMAL HIGH (ref 1.7–7.7)
Neutro Abs: 7.8 10*3/uL — ABNORMAL HIGH (ref 1.7–7.7)
Neutrophils Relative %: 90 %
Neutrophils Relative %: 79 %
Platelets: 285 10*3/uL (ref 150–400)
Platelets: 254 10*3/uL (ref 150–400)
RBC: 2.92 MIL/uL — ABNORMAL LOW (ref 3.87–5.11)
RBC: 2.71 MIL/uL — ABNORMAL LOW (ref 3.87–5.11)
RDW: 15.1 % (ref 11.5–15.5)
RDW: 15.5 % (ref 11.5–15.5)
WBC: 16.8 10*3/uL — ABNORMAL HIGH (ref 4.0–10.5)
WBC: 9.8 10*3/uL (ref 4.0–10.5)
nRBC: 0 % (ref 0.0–0.2)
nRBC: 0 % (ref 0.0–0.2)

## 2018-12-20 LAB — BASIC METABOLIC PANEL
Anion gap: 6 (ref 5–15)
BUN: 12 mg/dL (ref 6–20)
CO2: 20 mmol/L — ABNORMAL LOW (ref 22–32)
Calcium: 7.7 mg/dL — ABNORMAL LOW (ref 8.9–10.3)
Chloride: 113 mmol/L — ABNORMAL HIGH (ref 98–111)
Creatinine, Ser: 0.67 mg/dL (ref 0.44–1.00)
GFR calc Af Amer: >60 mL/min (ref >60)
GFR calc non Af Amer: >60 mL/min (ref >60)
Glucose, Bld: 105 mg/dL — ABNORMAL HIGH (ref 70–99)
Potassium: 4.3 mmol/L (ref 3.5–5.1)
Sodium: 139 mmol/L (ref 135–145)

## 2018-12-20 LAB — GLUCOSE, CAPILLARY
Glucose-Capillary: 121 mg/dL — ABNORMAL HIGH (ref 70–99)
Glucose-Capillary: 88 mg/dL (ref 70–99)
Glucose-Capillary: 109 mg/dL — ABNORMAL HIGH (ref 70–99)

## 2018-12-20 LAB — CBC
HCT: 22 % — ABNORMAL LOW (ref 36.0–46.0)
Hemoglobin: 7 g/dL — ABNORMAL LOW (ref 12.0–15.0)
MCH: 27 pg (ref 26.0–34.0)
MCHC: 31.8 g/dL (ref 30.0–36.0)
MCV: 84.9 fL (ref 80.0–100.0)
Platelets: 213 10*3/uL (ref 150–400)
RBC: 2.59 MIL/uL — ABNORMAL LOW (ref 3.87–5.11)
RDW: 15.4 % (ref 11.5–15.5)
WBC: 8.1 10*3/uL (ref 4.0–10.5)
nRBC: 0 % (ref 0.0–0.2)

## 2018-12-20 LAB — SAMPLE TO BLOOD BANK

## 2018-12-20 LAB — HEMOGLOBIN A1C
Hgb A1c MFr Bld: 5.5 % (ref 4.8–5.6)
Mean Plasma Glucose: 111.15 mg/dL

## 2018-12-20 LAB — LACTIC ACID, PLASMA
Lactic Acid, Venous: 2.2 mmol/L (ref 0.5–1.9)
Lactic Acid, Venous: 4.6 mmol/L (ref 0.5–1.9)

## 2018-12-20 LAB — HIV ANTIBODY (ROUTINE TESTING W REFLEX): HIV Screen 4th Generation wRfx: NONREACTIVE

## 2018-12-20 LAB — MRSA PCR SCREENING: MRSA by PCR: NEGATIVE

## 2018-12-20 MED ORDER — POTASSIUM CHLORIDE 10 MEQ/100ML IV SOLN: 10.0000 meq | INTRAVENOUS | Status: DC

## 2018-12-20 MED ORDER — INSULIN ASPART 100 UNIT/ML ~~LOC~~ SOLN
0.0000 [IU] | Freq: Three times a day (TID) | SUBCUTANEOUS | Status: DC
  Administered 2018-12-20: 2 [IU] via SUBCUTANEOUS

## 2018-12-20 MED ORDER — METHOCARBAMOL 1000 MG/10ML IJ SOLN
500.0000 mg | Freq: Four times a day (QID) | INTRAVENOUS | Status: DC | PRN
  Administered 2018-12-20 – 2018-12-23 (×4): 500 mg via INTRAVENOUS
  Filled 2018-12-20 (×2): qty 5
  Filled 2018-12-20: qty 500
  Filled 2018-12-20 (×3): qty 5

## 2018-12-20 MED ORDER — POTASSIUM CHLORIDE CRYS ER 20 MEQ PO TBCR
40.0000 meq | EXTENDED_RELEASE_TABLET | Freq: Once | ORAL | Status: AC
  Administered 2018-12-20: 40 meq via ORAL
  Filled 2018-12-20: qty 2

## 2018-12-20 MED ORDER — LACTATED RINGERS IV BOLUS
1000.0000 mL | Freq: Once | INTRAVENOUS | Status: AC
  Administered 2018-12-20: 1000 mL via INTRAVENOUS
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
## 2018-12-20 NOTE — Progress Notes (Signed)
Orthopedic Tech Progress Note Patient Details:  BRITANEY ESPAILLAT [birth year] [id number]  Ortho Devices Type of Ortho Device: Post (short leg) splint Ortho Device/Splint Location: rle. reapplied existing splint at dr request. Manson Passey Device/Splint Interventions: Ordered, Application, Adjustment   Post Interventions Patient Tolerated: Well Instructions Provided: Care of device, Adjustment of device   Karolee Stamps 12/20/2018, 9:39 AM
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
## 2018-12-21 ENCOUNTER — Encounter (HOSPITAL_COMMUNITY): Payer: Self-pay | Admitting: *Deleted

## 2018-12-21 LAB — CBC
HCT: 19.2 % — ABNORMAL LOW (ref 36.0–46.0)
Hemoglobin: 5.9 g/dL — CL (ref 12.0–15.0)
MCH: 26.7 pg (ref 26.0–34.0)
MCHC: 30.7 g/dL (ref 30.0–36.0)
MCV: 86.9 fL (ref 80.0–100.0)
Platelets: 168 10*3/uL (ref 150–400)
RBC: 2.21 MIL/uL — ABNORMAL LOW (ref 3.87–5.11)
RDW: 15.4 % (ref 11.5–15.5)
WBC: 6.6 10*3/uL (ref 4.0–10.5)
nRBC: 0 % (ref 0.0–0.2)

## 2018-12-21 LAB — BASIC METABOLIC PANEL
Anion gap: 6 (ref 5–15)
BUN: 9 mg/dL (ref 6–20)
CO2: 22 mmol/L (ref 22–32)
Calcium: 7.8 mg/dL — ABNORMAL LOW (ref 8.9–10.3)
Chloride: 109 mmol/L (ref 98–111)
Creatinine, Ser: 0.59 mg/dL (ref 0.44–1.00)
GFR calc Af Amer: >60 mL/min (ref >60)
GFR calc non Af Amer: >60 mL/min (ref >60)
Glucose, Bld: 103 mg/dL — ABNORMAL HIGH (ref 70–99)
Potassium: 4.2 mmol/L (ref 3.5–5.1)
Sodium: 137 mmol/L (ref 135–145)

## 2018-12-21 LAB — GLUCOSE, CAPILLARY
Glucose-Capillary: 107 mg/dL — ABNORMAL HIGH (ref 70–99)
Glucose-Capillary: 120 mg/dL — ABNORMAL HIGH (ref 70–99)
Glucose-Capillary: 84 mg/dL (ref 70–99)
Glucose-Capillary: 86 mg/dL (ref 70–99)

## 2018-12-21 LAB — ABO/RH: ABO/RH(D): A POS

## 2018-12-21 MED ORDER — SODIUM CHLORIDE 0.9% IV SOLUTION
Freq: Once | INTRAVENOUS | Status: AC
  Administered 2018-12-21: 10:00:00 via INTRAVENOUS

## 2018-12-21 MED ORDER — CHLORHEXIDINE GLUCONATE CLOTH 2 % EX PADS
6.0000 | MEDICATED_PAD | Freq: Every morning | CUTANEOUS | Status: DC
  Administered 2018-12-21 – 2018-12-28 (×7): 6 via TOPICAL

## 2018-12-21 MED ORDER — SODIUM CHLORIDE 0.9 % IV SOLN
INTRAVENOUS | Status: DC
  Administered 2018-12-21 – 2018-12-23 (×2): via INTRAVENOUS
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
## 2018-12-21 NOTE — Progress Notes (Signed)
CT scan right ankle demonstrates no fracture.  Foreign body on plain radiographs not present on CT scan.  She needs stress x-rays on Monday to confirm no syndesmotic disruption..  Clinically her ankle does feel unstable at the syndesmosis.  Von Willebrand's disease a complicating factor for intervention

## 2018-12-21 NOTE — Progress Notes (Signed)
Spoke to Dr. Barry Dienes about patient's morning hemoglobin of 5.9. New orders received to transfuse 2 units of packed red blood cells. Will continue to monitor.

## 2018-12-21 NOTE — Progress Notes (Signed)
Patient states that she has Von Willebrand disease, She was diagnosed in [place] at the age of 1 years.She usually gets heavy periods from this and is currently on her menses.Patient forgot to include this as her past medical history. Chart rectified.

## 2018-12-22 LAB — CBC
HCT: 26.9 % — ABNORMAL LOW (ref 36.0–46.0)
HCT: 27.6 % — ABNORMAL LOW (ref 36.0–46.0)
HCT: 27.6 % — ABNORMAL LOW (ref 36.0–46.0)
HCT: 28 % — ABNORMAL LOW (ref 36.0–46.0)
HCT: 28.7 % — ABNORMAL LOW (ref 36.0–46.0)
Hemoglobin: 8.9 g/dL — ABNORMAL LOW (ref 12.0–15.0)
Hemoglobin: 8.9 g/dL — ABNORMAL LOW (ref 12.0–15.0)
Hemoglobin: 8.9 g/dL — ABNORMAL LOW (ref 12.0–15.0)
Hemoglobin: 9 g/dL — ABNORMAL LOW (ref 12.0–15.0)
Hemoglobin: 9.4 g/dL — ABNORMAL LOW (ref 12.0–15.0)
MCH: 27.6 pg (ref 26.0–34.0)
MCH: 27.8 pg (ref 26.0–34.0)
MCH: 27.8 pg (ref 26.0–34.0)
MCH: 28.1 pg (ref 26.0–34.0)
MCH: 28.3 pg (ref 26.0–34.0)
MCHC: 32.1 g/dL (ref 30.0–36.0)
MCHC: 32.2 g/dL (ref 30.0–36.0)
MCHC: 32.2 g/dL (ref 30.0–36.0)
MCHC: 32.8 g/dL (ref 30.0–36.0)
MCHC: 33.1 g/dL (ref 30.0–36.0)
MCV: 85.7 fL (ref 80.0–100.0)
MCV: 85.9 fL (ref 80.0–100.0)
MCV: 85.9 fL (ref 80.0–100.0)
MCV: 86.3 fL (ref 80.0–100.0)
MCV: 86.3 fL (ref 80.0–100.0)
Platelets: 161 10*3/uL (ref 150–400)
Platelets: 168 10*3/uL (ref 150–400)
Platelets: 183 10*3/uL (ref 150–400)
Platelets: 192 10*3/uL (ref 150–400)
Platelets: 203 10*3/uL (ref 150–400)
RBC: 3.14 MIL/uL — ABNORMAL LOW (ref 3.87–5.11)
RBC: 3.2 MIL/uL — ABNORMAL LOW (ref 3.87–5.11)
RBC: 3.2 MIL/uL — ABNORMAL LOW (ref 3.87–5.11)
RBC: 3.26 MIL/uL — ABNORMAL LOW (ref 3.87–5.11)
RBC: 3.34 MIL/uL — ABNORMAL LOW (ref 3.87–5.11)
RDW: 14.2 % (ref 11.5–15.5)
RDW: 14.4 % (ref 11.5–15.5)
RDW: 14.6 % (ref 11.5–15.5)
RDW: 14.7 % (ref 11.5–15.5)
RDW: 14.8 % (ref 11.5–15.5)
WBC: 7.8 10*3/uL (ref 4.0–10.5)
WBC: 7.8 10*3/uL (ref 4.0–10.5)
WBC: 8.3 10*3/uL (ref 4.0–10.5)
WBC: 8.6 10*3/uL (ref 4.0–10.5)
WBC: 9.3 10*3/uL (ref 4.0–10.5)
nRBC: 0 % (ref 0.0–0.2)
nRBC: 0 % (ref 0.0–0.2)
nRBC: 0.3 % — ABNORMAL HIGH (ref 0.0–0.2)
nRBC: 0.5 % — ABNORMAL HIGH (ref 0.0–0.2)
nRBC: 0.9 % — ABNORMAL HIGH (ref 0.0–0.2)

## 2018-12-22 LAB — BPAM RBC
Blood Product Expiration Date: 202007122359
Blood Product Expiration Date: 202007122359
ISSUE DATE / TIME: 202007040945
ISSUE DATE / TIME: 202007041257
Unit Type and Rh: 6200
Unit Type and Rh: 6200

## 2018-12-22 LAB — BASIC METABOLIC PANEL
Anion gap: 7 (ref 5–15)
BUN: 5 mg/dL — ABNORMAL LOW (ref 6–20)
CO2: 23 mmol/L (ref 22–32)
Calcium: 8 mg/dL — ABNORMAL LOW (ref 8.9–10.3)
Chloride: 110 mmol/L (ref 98–111)
Creatinine, Ser: 0.62 mg/dL (ref 0.44–1.00)
GFR calc Af Amer: >60 mL/min (ref >60)
GFR calc non Af Amer: >60 mL/min (ref >60)
Glucose, Bld: 102 mg/dL — ABNORMAL HIGH (ref 70–99)
Potassium: 4.1 mmol/L (ref 3.5–5.1)
Sodium: 140 mmol/L (ref 135–145)

## 2018-12-22 LAB — TYPE AND SCREEN
ABO/RH(D): A POS
Antibody Screen: NEGATIVE
Unit division: 0
Unit division: 0

## 2018-12-22 LAB — GLUCOSE, CAPILLARY
Glucose-Capillary: 100 mg/dL — ABNORMAL HIGH (ref 70–99)
Glucose-Capillary: 93 mg/dL (ref 70–99)
Glucose-Capillary: 70 mg/dL (ref 70–99)
Glucose-Capillary: 102 mg/dL — ABNORMAL HIGH (ref 70–99)

## 2018-12-22 NOTE — Progress Notes (Signed)
Transferred from 4N to 6N19 , pt tolerated transfer well, on maximum assist and nwb on RLE.

## 2018-12-22 NOTE — Progress Notes (Signed)
Left upper extremity swelling noted, none pitting edema. IVF running at 138ml/hr and pt now on clears. Extremity already elevated on pillow. MD text paged
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
## 2018-12-23 ENCOUNTER — Inpatient Hospital Stay (HOSPITAL_COMMUNITY): Payer: 59

## 2018-12-23 DIAGNOSIS — M7989 Other specified soft tissue disorders: Secondary | ICD-10-CM

## 2018-12-23 LAB — CBC
HCT: 26.8 % — ABNORMAL LOW (ref 36.0–46.0)
HCT: 25.5 % — ABNORMAL LOW (ref 36.0–46.0)
Hemoglobin: 8.8 g/dL — ABNORMAL LOW (ref 12.0–15.0)
Hemoglobin: 8.7 g/dL — ABNORMAL LOW (ref 12.0–15.0)
MCH: 28.3 pg (ref 26.0–34.0)
MCH: 28.2 pg (ref 26.0–34.0)
MCHC: 32.8 g/dL (ref 30.0–36.0)
MCHC: 34.1 g/dL (ref 30.0–36.0)
MCV: 86.2 fL (ref 80.0–100.0)
MCV: 82.8 fL (ref 80.0–100.0)
Platelets: 197 10*3/uL (ref 150–400)
Platelets: 194 10*3/uL (ref 150–400)
RBC: 3.11 MIL/uL — ABNORMAL LOW (ref 3.87–5.11)
RBC: 3.08 MIL/uL — ABNORMAL LOW (ref 3.87–5.11)
RDW: 14.7 % (ref 11.5–15.5)
RDW: 14.7 % (ref 11.5–15.5)
WBC: 7.9 10*3/uL (ref 4.0–10.5)
WBC: 7.5 10*3/uL (ref 4.0–10.5)
nRBC: 0.3 % — ABNORMAL HIGH (ref 0.0–0.2)
nRBC: 0.4 % — ABNORMAL HIGH (ref 0.0–0.2)

## 2018-12-23 LAB — GLUCOSE, CAPILLARY
Glucose-Capillary: 96 mg/dL (ref 70–99)
Glucose-Capillary: 97 mg/dL (ref 70–99)
Glucose-Capillary: 88 mg/dL (ref 70–99)
Glucose-Capillary: 127 mg/dL — ABNORMAL HIGH (ref 70–99)

## 2018-12-23 MED ORDER — BISACODYL 10 MG RE SUPP: 10.0000 mg | Freq: Every day | RECTAL | Status: DC | PRN

## 2018-12-23 MED ORDER — METHOCARBAMOL 500 MG PO TABS
1000.0000 mg | ORAL_TABLET | Freq: Three times a day (TID) | ORAL | Status: DC
  Administered 2018-12-23 – 2018-12-24 (×6): 1000 mg via ORAL
  Filled 2018-12-23 (×7): qty 2

## 2018-12-23 MED ORDER — GABAPENTIN 600 MG PO TABS
300.0000 mg | ORAL_TABLET | Freq: Three times a day (TID) | ORAL | Status: DC
  Administered 2018-12-23 – 2018-12-28 (×16): 300 mg via ORAL
  Filled 2018-12-23 (×16): qty 1

## 2018-12-23 MED ORDER — OXYCODONE HCL 5 MG PO TABS
5.0000 mg | ORAL_TABLET | ORAL | Status: DC | PRN
  Administered 2018-12-23 – 2018-12-28 (×19): 10 mg via ORAL
  Filled 2018-12-23 (×19): qty 2

## 2018-12-23 MED ORDER — POLYETHYLENE GLYCOL 3350 17 G PO PACK
17.0000 g | PACK | Freq: Every day | ORAL | Status: DC
  Administered 2018-12-23 – 2018-12-28 (×5): 17 g via ORAL
  Filled 2018-12-23 (×6): qty 1

## 2018-12-23 MED ORDER — ACETAMINOPHEN 500 MG PO TABS
1000.0000 mg | ORAL_TABLET | Freq: Four times a day (QID) | ORAL | Status: DC
  Administered 2018-12-23 – 2018-12-28 (×19): 1000 mg via ORAL
  Filled 2018-12-23 (×19): qty 2

## 2018-12-23 NOTE — Progress Notes (Signed)
LUE venous duplex       has been completed. Preliminary results can be found under CV proc through chart review. Karelly Dewalt, BS, RDMS, RVT   

## 2018-12-23 NOTE — Plan of Care (Signed)
  Problem: Clinical Measurements: Goal: Ability to maintain clinical measurements within normal limits will improve 12/23/2018 0114 by Corinna Lines, RN Outcome: Progressing 12/23/2018 0113 by Corinna Lines, RN Outcome: Progressing   Problem: Pain Managment: Goal: General experience of comfort will improve Outcome: Progressing
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
## 2018-12-24 LAB — COMPREHENSIVE METABOLIC PANEL
ALT: 60 U/L — ABNORMAL HIGH (ref 0–44)
AST: 42 U/L — ABNORMAL HIGH (ref 15–41)
Albumin: 2.6 g/dL — ABNORMAL LOW (ref 3.5–5.0)
Alkaline Phosphatase: 68 U/L (ref 38–126)
Anion gap: 6 (ref 5–15)
BUN: 8 mg/dL (ref 6–20)
CO2: 21 mmol/L — ABNORMAL LOW (ref 22–32)
Calcium: 8.2 mg/dL — ABNORMAL LOW (ref 8.9–10.3)
Chloride: 110 mmol/L (ref 98–111)
Creatinine, Ser: 0.48 mg/dL (ref 0.44–1.00)
GFR calc Af Amer: >60 mL/min (ref >60)
GFR calc non Af Amer: >60 mL/min (ref >60)
Glucose, Bld: 99 mg/dL (ref 70–99)
Potassium: 3.8 mmol/L (ref 3.5–5.1)
Sodium: 137 mmol/L (ref 135–145)
Total Bilirubin: 1.5 mg/dL — ABNORMAL HIGH (ref 0.3–1.2)
Total Protein: 5.6 g/dL — ABNORMAL LOW (ref 6.5–8.1)

## 2018-12-24 LAB — CBC
HCT: 28 % — ABNORMAL LOW (ref 36.0–46.0)
Hemoglobin: 9.1 g/dL — ABNORMAL LOW (ref 12.0–15.0)
MCH: 28.1 pg (ref 26.0–34.0)
MCHC: 32.5 g/dL (ref 30.0–36.0)
MCV: 86.4 fL (ref 80.0–100.0)
Platelets: 215 10*3/uL (ref 150–400)
RBC: 3.24 MIL/uL — ABNORMAL LOW (ref 3.87–5.11)
RDW: 14.9 % (ref 11.5–15.5)
WBC: 9 10*3/uL (ref 4.0–10.5)
nRBC: 0.3 % — ABNORMAL HIGH (ref 0.0–0.2)

## 2018-12-24 LAB — GLUCOSE, CAPILLARY
Glucose-Capillary: 108 mg/dL — ABNORMAL HIGH (ref 70–99)
Glucose-Capillary: 96 mg/dL (ref 70–99)
Glucose-Capillary: 130 mg/dL — ABNORMAL HIGH (ref 70–99)
Glucose-Capillary: 80 mg/dL (ref 70–99)

## 2018-12-24 NOTE — Progress Notes (Signed)
Rehab Admissions Coordinator Note:  Patient was screened by Cleatrice Burke for appropriateness for an Inpatient Acute Rehab Consult per PT rec.Marland Kitchen  At this time, we are recommending Inpatient Rehab consult. Please place order for consult.  Cleatrice Burke RN MSN 12/24/2018, 2:08 PM  I can be reached at [phone].
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
## 2018-12-25 LAB — GLUCOSE, CAPILLARY
Glucose-Capillary: 114 mg/dL — ABNORMAL HIGH (ref 70–99)
Glucose-Capillary: 113 mg/dL — ABNORMAL HIGH (ref 70–99)
Glucose-Capillary: 107 mg/dL — ABNORMAL HIGH (ref 70–99)
Glucose-Capillary: 97 mg/dL (ref 70–99)

## 2018-12-25 MED ORDER — METHOCARBAMOL 500 MG PO TABS
1000.0000 mg | ORAL_TABLET | Freq: Four times a day (QID) | ORAL | Status: DC
  Administered 2018-12-25 – 2018-12-26 (×6): 1000 mg via ORAL
  Filled 2018-12-25 (×6): qty 2

## 2018-12-25 NOTE — Progress Notes (Signed)
MRI scan reviewed.  Syndesmosis appears to be intact without much in the way of edema or evidence of injury.  Plan is okay for weightbearing and fracture boot.  She should return to Ortho clinic in a week and I will do stress x-rays in my office
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
## 2018-12-25 NOTE — Progress Notes (Signed)
CSW met with patient via bedside to complete SBIRT- patient currently lives at home with her spouse and children. She is hopeful to go to CIR once medically stable and then will return home with family. Patient voiced no current concerns with alcohol or substance abuse at this time. CSW encouraged patient to reach out to the [facility] or PCP for any questions/ concerns.   Kingsley Spittle, LCSW [facility]  [phone]

## 2018-12-25 NOTE — Progress Notes (Addendum)
Inpatient Rehab Admissions:  Inpatient Rehab Consult received.  I met with patient at the bedside for rehabilitation assessment and to discuss goals and expectations of an inpatient rehab admission.  She is interested in CIR program, reports she has a lot of support at home (husband, both of her parents, and her brother).  Will need to confirm dispo and obtain authorization from pt's insurance prior to admission, possibly tomorrow or Friday, pending bed availability.   Addendum: I was able to speak with pt's husband, Will. He confirms family can provide assist, and he is working to build pt a ramp.  I will open up insurance for prior authorization and plan to admit tomorrow or Friday pending their approval and bed availability.   Signed: Shann Medal, PT, DPT Admissions Coordinator [phone] 12/25/18  12:58 PM

## 2018-12-25 NOTE — Progress Notes (Signed)
2010 Pt's mother, cathy called, this RN gave updates regarding pt situation. She would like to speak to MD in the morning regarding pt's care and updates.
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
## 2018-12-25 NOTE — Plan of Care (Signed)
  Problem: Clinical Measurements: Goal: Ability to maintain clinical measurements within normal limits will improve Outcome: Progressing Goal: Will remain free from infection Outcome: Progressing   Problem: Pain Managment: Goal: General experience of comfort will improve Outcome: Progressing

## 2018-12-26 ENCOUNTER — Telehealth: Payer: Self-pay

## 2018-12-26 ENCOUNTER — Inpatient Hospital Stay (HOSPITAL_COMMUNITY): Payer: 59

## 2018-12-26 LAB — GLUCOSE, CAPILLARY
Glucose-Capillary: 101 mg/dL — ABNORMAL HIGH (ref 70–99)
Glucose-Capillary: 123 mg/dL — ABNORMAL HIGH (ref 70–99)
Glucose-Capillary: 96 mg/dL (ref 70–99)
Glucose-Capillary: 111 mg/dL — ABNORMAL HIGH (ref 70–99)

## 2018-12-26 MED ORDER — HYDROMORPHONE HCL 1 MG/ML IJ SOLN
0.5000 mg | INTRAMUSCULAR | Status: DC | PRN
  Administered 2018-12-26 – 2018-12-27 (×3): 1 mg via INTRAVENOUS
  Filled 2018-12-26 (×3): qty 1

## 2018-12-26 MED ORDER — METHOCARBAMOL 500 MG PO TABS
1000.0000 mg | ORAL_TABLET | Freq: Three times a day (TID) | ORAL | Status: DC
  Administered 2018-12-26 – 2018-12-28 (×5): 1000 mg via ORAL
  Filled 2018-12-26 (×5): qty 2

## 2018-12-26 MED ORDER — HYDROMORPHONE HCL 1 MG/ML IJ SOLN
1.0000 mg | Freq: Once | INTRAMUSCULAR | Status: AC | PRN
  Administered 2018-12-26: 1 mg via INTRAVENOUS
  Filled 2018-12-26: qty 1

## 2018-12-26 MED ORDER — BISACODYL 10 MG RE SUPP
10.0000 mg | Freq: Once | RECTAL | Status: AC
  Administered 2018-12-26: 10 mg via RECTAL
  Filled 2018-12-26: qty 1

## 2018-12-26 MED ORDER — TRAMADOL HCL 50 MG PO TABS
50.0000 mg | ORAL_TABLET | Freq: Four times a day (QID) | ORAL | Status: DC
  Administered 2018-12-26 – 2018-12-28 (×8): 50 mg via ORAL
  Filled 2018-12-26 (×8): qty 1

## 2018-12-26 NOTE — Telephone Encounter (Signed)
Patients mom [name] is calling wanting to speak with you. She is wanting an update from you regarding her. Please call to discuss [phone]

## 2018-12-26 NOTE — Progress Notes (Signed)
Orthopedic Tech Progress Note Patient Details:  [name] May 11, 1986 [id number] Went to apply CAM WALKER to patient. Even with pain medicine patient is in horrible pain and will not allow me to move/touch that leg. I started to put on the boot "took it apart" thinking that would help but she just screams even louder. On top of her leg being to short for the cam walker. I asked the RN to get in touch with the ORTHO DR or TRAUMA DR so we could work together for the patient. Patient ID: [name], female   DOB: 08-02-1985, 33 y.o.   MRN: [id number]   [name] 12/26/2018, 2:26 PM

## 2018-12-26 NOTE — Telephone Encounter (Signed)
error 
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
## 2018-12-26 NOTE — Telephone Encounter (Signed)
Dr Marlou Sa called again s/w patients husband.
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
## 2018-12-26 NOTE — Telephone Encounter (Signed)
I called could not get through

## 2018-12-26 NOTE — Progress Notes (Signed)
Orthopedic Tech Progress Note Patient Details:  [name] Jan 24, 1986 [id number] Done by therapy Patient ID: Abigail Butts, female   DOB: August 23, 1985, 33 y.o.   MRN: [id number]   [name] 12/26/2018, 3:58 PM
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
## 2018-12-27 ENCOUNTER — Inpatient Hospital Stay (HOSPITAL_COMMUNITY): Payer: 59

## 2018-12-27 LAB — GLUCOSE, CAPILLARY
Glucose-Capillary: 100 mg/dL — ABNORMAL HIGH (ref 70–99)
Glucose-Capillary: 100 mg/dL — ABNORMAL HIGH (ref 70–99)
Glucose-Capillary: 115 mg/dL — ABNORMAL HIGH (ref 70–99)
Glucose-Capillary: 109 mg/dL — ABNORMAL HIGH (ref 70–99)

## 2018-12-27 NOTE — Plan of Care (Signed)
  Problem: Clinical Measurements: Goal: Ability to maintain clinical measurements within normal limits will improve Outcome: Progressing   Problem: Pain Managment: Goal: General experience of comfort will improve Outcome: Not Progressing   

## 2018-12-27 NOTE — Progress Notes (Signed)
Inpatient Rehab Admissions Coordinator:   Met with patient at the bedside.  Her pain is more controlled today, though still requiring IV Dilaudid x2 already since midnight.  She is working on decreasing this.  Will plan for possible admission tomorrow if pt's pain more under control.  My coworker, Jhonnie Garner, will f/u tomorrow morning.   Shann Medal, PT, DPT Admissions Coordinator [phone] 12/27/18  10:38 AM
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
## 2018-12-28 ENCOUNTER — Inpatient Hospital Stay (HOSPITAL_COMMUNITY)
Admission: RE | Admit: 2018-12-28 | Discharge: 2019-01-03 | DRG: 560 | Disposition: A | Discharge status: Home Health | Payer: 59 | Source: Intra-hospital | Attending: Physical Medicine & Rehabilitation | Admitting: Physical Medicine & Rehabilitation

## 2018-12-28 ENCOUNTER — Inpatient Hospital Stay (HOSPITAL_COMMUNITY): Payer: 59

## 2018-12-28 DIAGNOSIS — S22069D Unspecified fracture of T7-T8 vertebra, subsequent encounter for fracture with routine healing: Secondary | ICD-10-CM | POA: Diagnosis not present

## 2018-12-28 DIAGNOSIS — S82831D Other fracture of upper and lower end of right fibula, subsequent encounter for closed fracture with routine healing: Principal | ICD-10-CM

## 2018-12-28 DIAGNOSIS — G8918 Other acute postprocedural pain: Secondary | ICD-10-CM | POA: Diagnosis present

## 2018-12-28 DIAGNOSIS — R52 Pain, unspecified: Secondary | ICD-10-CM

## 2018-12-28 DIAGNOSIS — S36039A Unspecified laceration of spleen, initial encounter: Secondary | ICD-10-CM

## 2018-12-28 DIAGNOSIS — Z881 Allergy status to other antibiotic agents status: Secondary | ICD-10-CM | POA: Diagnosis not present

## 2018-12-28 DIAGNOSIS — D6800 Von Willebrand disease, unspecified: Secondary | ICD-10-CM

## 2018-12-28 DIAGNOSIS — Z91040 Latex allergy status: Secondary | ICD-10-CM | POA: Diagnosis not present

## 2018-12-28 DIAGNOSIS — F4310 Post-traumatic stress disorder, unspecified: Secondary | ICD-10-CM | POA: Diagnosis present

## 2018-12-28 DIAGNOSIS — S82409A Unspecified fracture of shaft of unspecified fibula, initial encounter for closed fracture: Secondary | ICD-10-CM | POA: Diagnosis present

## 2018-12-28 DIAGNOSIS — K5903 Drug induced constipation: Secondary | ICD-10-CM | POA: Diagnosis not present

## 2018-12-28 DIAGNOSIS — E46 Unspecified protein-calorie malnutrition: Secondary | ICD-10-CM | POA: Diagnosis present

## 2018-12-28 DIAGNOSIS — S36892A Contusion of other intra-abdominal organs, initial encounter: Secondary | ICD-10-CM

## 2018-12-28 DIAGNOSIS — S92001D Unspecified fracture of right calcaneus, subsequent encounter for fracture with routine healing: Secondary | ICD-10-CM | POA: Diagnosis not present

## 2018-12-28 DIAGNOSIS — S82401A Unspecified fracture of shaft of right fibula, initial encounter for closed fracture: Secondary | ICD-10-CM

## 2018-12-28 DIAGNOSIS — S22009A Unspecified fracture of unspecified thoracic vertebra, initial encounter for closed fracture: Secondary | ICD-10-CM

## 2018-12-28 DIAGNOSIS — S22059D Unspecified fracture of T5-T6 vertebra, subsequent encounter for fracture with routine healing: Secondary | ICD-10-CM

## 2018-12-28 DIAGNOSIS — D62 Acute posthemorrhagic anemia: Secondary | ICD-10-CM | POA: Diagnosis present

## 2018-12-28 DIAGNOSIS — S32059D Unspecified fracture of fifth lumbar vertebra, subsequent encounter for fracture with routine healing: Secondary | ICD-10-CM

## 2018-12-28 DIAGNOSIS — Z79899 Other long term (current) drug therapy: Secondary | ICD-10-CM | POA: Diagnosis not present

## 2018-12-28 DIAGNOSIS — S36113D Laceration of liver, unspecified degree, subsequent encounter: Secondary | ICD-10-CM | POA: Diagnosis not present

## 2018-12-28 DIAGNOSIS — M25371 Other instability, right ankle: Secondary | ICD-10-CM

## 2018-12-28 DIAGNOSIS — K5909 Other constipation: Secondary | ICD-10-CM | POA: Diagnosis present

## 2018-12-28 DIAGNOSIS — E8809 Other disorders of plasma-protein metabolism, not elsewhere classified: Secondary | ICD-10-CM

## 2018-12-28 DIAGNOSIS — S32009A Unspecified fracture of unspecified lumbar vertebra, initial encounter for closed fracture: Secondary | ICD-10-CM

## 2018-12-28 DIAGNOSIS — S22079D Unspecified fracture of T9-T10 vertebra, subsequent encounter for fracture with routine healing: Secondary | ICD-10-CM | POA: Diagnosis not present

## 2018-12-28 DIAGNOSIS — S36113A Laceration of liver, unspecified degree, initial encounter: Secondary | ICD-10-CM

## 2018-12-28 DIAGNOSIS — T07XXXA Unspecified multiple injuries, initial encounter: Secondary | ICD-10-CM

## 2018-12-28 DIAGNOSIS — S2249XA Multiple fractures of ribs, unspecified side, initial encounter for closed fracture: Secondary | ICD-10-CM

## 2018-12-28 LAB — GLUCOSE, CAPILLARY
Glucose-Capillary: 106 mg/dL — ABNORMAL HIGH (ref 70–99)
Glucose-Capillary: 91 mg/dL (ref 70–99)

## 2018-12-28 MED ORDER — METHOCARBAMOL 500 MG PO TABS
1000.0000 mg | ORAL_TABLET | Freq: Three times a day (TID) | ORAL | Status: DC
  Administered 2018-12-28 – 2019-01-03 (×18): 1000 mg via ORAL
  Filled 2018-12-28 (×19): qty 2

## 2018-12-28 MED ORDER — OXYCODONE HCL 5 MG PO TABS: 5.0000 mg | ORAL_TABLET | ORAL | 0 refills | Status: DC | PRN

## 2018-12-28 MED ORDER — POLYETHYLENE GLYCOL 3350 17 G PO PACK
17.0000 g | PACK | Freq: Every day | ORAL | Status: DC
  Administered 2018-12-29: 17 g via ORAL
  Filled 2018-12-28 (×5): qty 1

## 2018-12-28 MED ORDER — METHOCARBAMOL 500 MG PO TABS: 1000.0000 mg | ORAL_TABLET | Freq: Three times a day (TID) | ORAL | 1 refills | Status: DC

## 2018-12-28 MED ORDER — ONDANSETRON 4 MG PO TBDP: 4.0000 mg | ORAL_TABLET | Freq: Four times a day (QID) | ORAL | Status: DC | PRN

## 2018-12-28 MED ORDER — ONDANSETRON HCL 4 MG/2ML IJ SOLN: 4.0000 mg | Freq: Four times a day (QID) | INTRAMUSCULAR | Status: DC | PRN

## 2018-12-28 MED ORDER — DOCUSATE SODIUM 100 MG PO CAPS
100.0000 mg | ORAL_CAPSULE | Freq: Two times a day (BID) | ORAL | Status: DC
  Administered 2018-12-28 – 2019-01-03 (×11): 100 mg via ORAL
  Filled 2018-12-28 (×12): qty 1

## 2018-12-28 MED ORDER — SORBITOL 70 % SOLN
30.0000 mL | Freq: Every day | Status: DC | PRN
  Administered 2019-01-01: 06:00:00 30 mL via ORAL
  Filled 2018-12-28: qty 30

## 2018-12-28 MED ORDER — GABAPENTIN 600 MG PO TABS
300.0000 mg | ORAL_TABLET | Freq: Three times a day (TID) | ORAL | Status: DC
  Administered 2018-12-28 – 2018-12-31 (×10): 300 mg via ORAL
  Filled 2018-12-28 (×10): qty 1

## 2018-12-28 MED ORDER — OXYCODONE HCL 5 MG PO TABS
5.0000 mg | ORAL_TABLET | ORAL | Status: DC | PRN
  Administered 2018-12-28: 5 mg via ORAL
  Administered 2018-12-28 – 2018-12-31 (×10): 10 mg via ORAL
  Filled 2018-12-28: qty 1
  Filled 2018-12-28 (×11): qty 2

## 2018-12-28 MED ORDER — BISACODYL 10 MG RE SUPP: 10.0000 mg | Freq: Every day | RECTAL | Status: DC | PRN

## 2018-12-28 MED ORDER — TRAMADOL HCL 50 MG PO TABS
50.0000 mg | ORAL_TABLET | Freq: Four times a day (QID) | ORAL | Status: DC
  Administered 2018-12-28 – 2019-01-03 (×25): 50 mg via ORAL
  Filled 2018-12-28 (×25): qty 1

## 2018-12-28 NOTE — Progress Notes (Signed)
Inpatient Rehabilitation-Admissions Coordinator   Pt's pain appears to be better controlled. AC has insurance approval and received medical clearance from attending service for admit to CIR today. RN updated on plan.   Please call if questions.   Jhonnie Garner, OTR/L  Rehab Admissions Coordinator  [phone] 12/28/2018 8:52 AM

## 2018-12-28 NOTE — Discharge Instructions (Signed)
Fibular Fracture Rehab Ask your health care provider which exercises are safe for you. Do exercises exactly as told by your health care provider and adjust them as directed. It is normal to feel mild stretching, pulling, tightness, or discomfort as you do these exercises. Stop the exercise right away if you feel sudden pain or your pain gets worse. Do not begin these exercises until told by your health care provider. Stretching and range-of-motion exercises These exercises warm up your muscles and joints and improve the movement and flexibility of your lower leg. The exercises also help to increase range of motion, and relieve pain. Calf stretch, seated This exercise is sometimes called gastrocnemius stretch, seated. To do this exercise: 1. Sit with your left / right leg extended. 2. Hold onto both ends of a belt or towel and loop it around the ball of your left / right foot. The ball of your foot is on the walking surface, right under your toes. 3. Keep your left / right ankle and foot relaxed and keep your knee straight while you use the belt or towel to pull your foot and ankle toward you. You should feel a gentle stretch behind your calf or knee. 4. Hold this position for __________ seconds. Repeat __________ times. Complete this exercise __________ times a day. Ankle alphabet  1. Sit with your left / right leg supported at the lower leg. ? Do not rest your foot on anything. ? Make sure your foot has room to move freely. 2. Think of your left / right foot as a paintbrush, and move your foot to trace each letter of the alphabet in the air. Keep your hip and knee still while you trace. Make the letters as large as you can without feeling discomfort. 3. Trace every letter from A to Z. Repeat __________ times. Complete this exercise __________ times a day. Ankle dorsiflexion, passive This is a stretching exercise in which your ankle moves with the help of another part of your body. 1. Remove  your shoes and sit on a chair that is placed on a non-carpeted surface. 2. Place your left / right foot on the floor, directly under your knee. Extend your other leg for support. 3. Keeping your heel down, slide your left / right foot back toward the chair until you feel a stretch at your ankle or calf. If you do not feel a stretch, slide your buttocks forward to the edge of the chair while still keeping your heel down. 4. Hold this stretch for __________ seconds. Repeat __________ times. Complete this exercise __________ times a day. Strengthening exercises These exercises build strength and endurance in your lower leg. Endurance is the ability to use your muscles for a long time, even after they get tired. Ankle dorsiflexion  1. Secure a rubber exercise band or tubing to a fixed object, such as a table or pole. 2. Secure the other end of the band around your left / right foot. 3. Sit on the floor, facing the fixed object. The band should be slightly tense when your foot is relaxed. 4. Slowly use your ankle muscles to pull the top of your foot toward you (dorsiflexion). 5. Hold this position for __________ seconds. 6. Slowly release the tension in the band and return your foot to the starting position. Repeat __________ times. Complete this exercise __________ times a day. Ankle plantar flexion  1. Sit with your left / right leg extended. 2. Loop a rubber exercise band or tubing  around the ball of your left / right foot. The ball of your foot is on the walking surface, right under your toes. 3. Hold onto both ends of the band or tubing. 4. Slowly push your foot and toes away from you, pointing them downward (plantar flexion). 5. Hold this position for __________ seconds. 6. Control the tension in the band as you slowly return to the starting position. Repeat __________ times. Complete this exercise __________ times a day. Ankle eversion with band 1. Secure one end of a rubber exercise band  or tubing to a fixed object, such as a table leg or a pole, that will stay still when the band is pulled. 2. Loop the other end of the band around the middle of your left / right foot. 3. Sit on the floor, facing the fixed object. The band should be slightly tense when your foot is relaxed. 4. Make fists with your hands and put them between your knees. This will focus your strengthening at your ankle. 5. Leading with your little toe, slowly push your banded foot outward, away from your body (eversion). Make sure the band is positioned to resist the entire motion. Do not move your heel. 6. Hold this position for __________ seconds. 7. Control the tension in the band as you slowly return to the starting position. Repeat __________ times. Complete this exercise __________ times a day. Ankle inversion with band 1. Secure one end of a rubber exercise band or tubing to a fixed object, such as a table leg or a pole, that will stay still when the band is pulled. 2. Loop the other end of the band around your left / right foot, near your toes. 3. Sit on the floor, facing the fixed object. The band should be slightly tense when your foot is relaxed. 4. Make fists with your hands and put them between your knees. This will focus your strengthening at your ankle. 5. Leading with your big toe, slowly pull your banded foot inward, toward your other leg (inversion). Make sure the band is positioned to resist the entire motion. Do not move your heel. 6. Hold this position for __________ seconds. 7. Control the tension in the band as you slowly return to the starting position. Repeat __________ times. Complete this exercise __________ times a day. This information is not intended to replace advice given to you by your health care provider. Make sure you discuss any questions you have with your health care provider. Document Released: 06/05/2005 Document Revised: 09/26/2018 Document Reviewed: 03/05/2018 Elsevier  Patient Education  2020 [place].   Fibular Fracture Rehab Ask your health care provider which exercises are safe for you. Do exercises exactly as told by your health care provider and adjust them as directed. It is normal to feel mild stretching, pulling, tightness, or discomfort as you do these exercises. Stop the exercise right away if you feel sudden pain or your pain gets worse. Do not begin these exercises until told by your health care provider. Stretching and range-of-motion exercises These exercises warm up your muscles and joints and improve the movement and flexibility of your lower leg. The exercises also help to increase range of motion, and relieve pain. Calf stretch, seated This exercise is sometimes called gastrocnemius stretch, seated. To do this exercise: 5. Sit with your left / right leg extended. 6. Hold onto both ends of a belt or towel and loop it around the ball of your left / right foot. The ball of  your foot is on the walking surface, right under your toes. 7. Keep your left / right ankle and foot relaxed and keep your knee straight while you use the belt or towel to pull your foot and ankle toward you. You should feel a gentle stretch behind your calf or knee. 8. Hold this position for __________ seconds. Repeat __________ times. Complete this exercise __________ times a day. Ankle alphabet  4. Sit with your left / right leg supported at the lower leg. ? Do not rest your foot on anything. ? Make sure your foot has room to move freely. 5. Think of your left / right foot as a paintbrush, and move your foot to trace each letter of the alphabet in the air. Keep your hip and knee still while you trace. Make the letters as large as you can without feeling discomfort. 6. Trace every letter from A to Z. Repeat __________ times. Complete this exercise __________ times a day. Ankle dorsiflexion, passive This is a stretching exercise in which your ankle moves with the help  of another part of your body. 5. Remove your shoes and sit on a chair that is placed on a non-carpeted surface. 6. Place your left / right foot on the floor, directly under your knee. Extend your other leg for support. 7. Keeping your heel down, slide your left / right foot back toward the chair until you feel a stretch at your ankle or calf. If you do not feel a stretch, slide your buttocks forward to the edge of the chair while still keeping your heel down. 8. Hold this stretch for __________ seconds. Repeat __________ times. Complete this exercise __________ times a day. Strengthening exercises These exercises build strength and endurance in your lower leg. Endurance is the ability to use your muscles for a long time, even after they get tired. Ankle dorsiflexion  7. Secure a rubber exercise band or tubing to a fixed object, such as a table or pole. 8. Secure the other end of the band around your left / right foot. 9. Sit on the floor, facing the fixed object. The band should be slightly tense when your foot is relaxed. 10. Slowly use your ankle muscles to pull the top of your foot toward you (dorsiflexion). 11. Hold this position for __________ seconds. 12. Slowly release the tension in the band and return your foot to the starting position. Repeat __________ times. Complete this exercise __________ times a day. Ankle plantar flexion  7. Sit with your left / right leg extended. 8. Loop a rubber exercise band or tubing around the ball of your left / right foot. The ball of your foot is on the walking surface, right under your toes. 9. Hold onto both ends of the band or tubing. 10. Slowly push your foot and toes away from you, pointing them downward (plantar flexion). 11. Hold this position for __________ seconds. 12. Control the tension in the band as you slowly return to the starting position. Repeat __________ times. Complete this exercise __________ times a day. Ankle eversion with  band 8. Secure one end of a rubber exercise band or tubing to a fixed object, such as a table leg or a pole, that will stay still when the band is pulled. 9. Loop the other end of the band around the middle of your left / right foot. 10. Sit on the floor, facing the fixed object. The band should be slightly tense when your foot is relaxed. 11. Make  fists with your hands and put them between your knees. This will focus your strengthening at your ankle. 12. Leading with your little toe, slowly push your banded foot outward, away from your body (eversion). Make sure the band is positioned to resist the entire motion. Do not move your heel. 13. Hold this position for __________ seconds. 14. Control the tension in the band as you slowly return to the starting position. Repeat __________ times. Complete this exercise __________ times a day. Ankle inversion with band 8. Secure one end of a rubber exercise band or tubing to a fixed object, such as a table leg or a pole, that will stay still when the band is pulled. 9. Loop the other end of the band around your left / right foot, near your toes. 10. Sit on the floor, facing the fixed object. The band should be slightly tense when your foot is relaxed. 11. Make fists with your hands and put them between your knees. This will focus your strengthening at your ankle. 12. Leading with your big toe, slowly pull your banded foot inward, toward your other leg (inversion). Make sure the band is positioned to resist the entire motion. Do not move your heel. 13. Hold this position for __________ seconds. 14. Control the tension in the band as you slowly return to the starting position. Repeat __________ times. Complete this exercise __________ times a day. This information is not intended to replace advice given to you by your health care provider. Make sure you discuss any questions you have with your health care provider. Document Released: 06/05/2005 Document  Revised: 09/26/2018 Document Reviewed: 03/05/2018 Elsevier Patient Education  [place].   Transverse Process Fracture  Bones of the spine (vertebrae) have portions that extend off to either side of the spine. These portions of bone are called transverse processes. A transverse process fracture, which is also called a rotation spine fracture, is a break in a transverse process. What are the causes? This condition may be caused by:  A fall from a great height.  A car accident.  A sports injury.  A gunshot wound.  A hard, direct hit to the back. This kind of fracture often results from a sudden and severe bending of the spine to one side. Depending on the cause of the fracture, one or more bones may be affected. What increases the risk? You are more likely to develop this condition if:  You have thinning and loss of density in the bones (osteoporosis).  You play a contact sport. What are the signs or symptoms? The main symptom of this condition is back pain. The pain may:  Be felt on the side of the spine (flank) where the fracture is.  Get worse when you move or take a deep breath. How is this diagnosed? This condition may be diagnosed based on:  Your symptoms.  Your medical history.  A physical exam. You may also have other tests, including:  X-rays.  A CT scan.  MRI. How is this treated? Most transverse process fractures heal on their own with time and rest. Treatment may involve supportive care, such as:  Limiting activity.  Medicines, such as: ? Pain medicine. ? Muscle-relaxing medicine.  Physical therapy.  A neck or back brace. Follow these instructions at home: If you have a brace:  Wear the neck or back brace as told by your health care provider. Remove it only as told by your health care provider.  Keep the brace clean.  If the brace is not waterproof: ? Do not let it get wet. ? Cover it with a watertight covering when you take a  bath or a shower. Managing pain, stiffness, and swelling   If directed, put ice on the injured area: ? If you have a removable brace, remove it as told by your health care provider. ? Put ice in a plastic bag. ? Place a towel between your skin and the bag. ? Leave the ice on for 20 minutes, 2-3 times a day. Medicines  Take over-the-counter and prescription medicines only as told by your health care provider.  Do not drive or use heavy machinery while taking prescription pain medicine.  If you are taking prescription pain medicine, take actions to prevent or treat constipation. Your health care provider may recommend that you: ? Drink enough fluid to keep your urine pale yellow. ? Eat foods that are high in fiber, such as fresh fruits and vegetables, whole grains, and beans. ? Limit foods that are high in fat and processed sugars, such as fried or sweet foods. ? Take an over-the-counter or prescription medicine for constipation. Activity  Stay in bed (on bed rest) only as directed by your health care provider. ? Avoid being in bed for a long time without moving. Get up to take short walks every 1-2 hours. This is important to improve blood flow and breathing. Ask for help if you feel weak or unsteady.  Return to your normal activities when your health care provider says it is okay. Ask if there are any activities that you should not do.  Do physical therapy exercises as recommended by your health care provider. General instructions  Do not use any products that contain nicotine or tobacco, such as cigarettes and e-cigarettes. These can delay bone healing. If you need help quitting, ask your health care provider.  Keep all follow-up visits as told by your health care provider. This is important. Visits can help to prevent permanent injury, disability, and long-lasting (chronic) pain. Contact a health care provider if:  You have a fever.  You develop a cough that makes your pain  worse.  Your pain medicine is not helping.  Your pain does not get better over time.  You cannot return to your normal activities as planned or expected. Get help right away if:  Your pain is very bad and it suddenly gets worse.  You are unable to move any body part (paralysis) that is below the level of your injury.  You have numbness, tingling, or weakness in any body part that is below the level of your injury.  You cannot control your bladder or bowels. Summary  A transverse process fracture is a break in the portion of the bone that extends to the side of the spine.  Most transverse process fractures heal on their own with time and rest.  You may also have supportive treatments such as a back brace, pain medicines, and physical therapy.  Keep all follow-up visits. This is important and will help to prevent permanent injury, disability, and long-lasting (chronic) pain. This information is not intended to replace advice given to you by your health care provider. Make sure you discuss any questions you have with your health care provider. Document Released: 09/20/2006 Document Revised: 07/18/2017 Document Reviewed: 07/18/2017 Elsevier Patient Education  2020 [place].   Costochondritis Costochondritis is swelling and irritation (inflammation) of the tissue (cartilage) that connects your ribs to your breastbone (sternum). This causes pain in  the front of your chest. Usually, the pain:  Starts gradually.  Is in more than one rib. This condition usually goes away on its own over time. Follow these instructions at home:  Do not do anything that makes your pain worse.  If directed, put ice on the painful area: ? Put ice in a plastic bag. ? Place a towel between your skin and the bag. ? Leave the ice on for 20 minutes, 2-3 times a day.  If directed, put heat on the affected area as often as told by your doctor. Use the heat source that your doctor tells you to use, such  as a moist heat pack or a heating pad. ? Place a towel between your skin and the heat source. ? Leave the heat on for 20-30 minutes. ? Take off the heat if your skin turns bright red. This is very important if you cannot feel pain, heat, or cold. You may have a greater risk of getting burned.  Take over-the-counter and prescription medicines only as told by your doctor.  Return to your normal activities as told by your doctor. Ask your doctor what activities are safe for you.  Keep all follow-up visits as told by your doctor. This is important. Contact a doctor if:  You have chills or a fever.  Your pain does not go away or it gets worse.  You have a cough that does not go away. Get help right away if:  You are short of breath. This information is not intended to replace advice given to you by your health care provider. Make sure you discuss any questions you have with your health care provider. Document Released: 11/22/2007 Document Revised: 06/20/2017 Document Reviewed: 09/29/2015 Elsevier Patient Education  2020 [place] Laceration  A liver laceration is a tear or a cut in the liver. The liver is an organ that is involved in many important bodily functions. Sometimes, a liver laceration can be a very serious injury. It can cause a lot of bleeding, and surgery may be needed. Other times, a liver laceration may be minor, and bed rest may be all that is needed. Either way, treatment in a hospital is almost always required. Liver lacerations are categorized in grades from 1 to 5. Low numbers identify lacerations that are less severe than lacerations with high numbers.  Grade 1: This is a tear in the outer lining of the liver. It is less than  inch (1 cm) deep.  Grade 2: This is a tear that is about  inch to 1 inch (1 to 3 cm) deep. It is less than 4 inches (10 cm) long.  Grade 3: This is a tear that is slightly more than 1 inch (3 cm) deep.  Grades 4 and 5:  These lacerations are very deep. They affect a large part of the liver. What are the causes? This condition may be caused by:  A forceful hit to the area around the liver (blunt trauma), such as in a car crash. Blunt trauma can tear the liver even though it does not break the skin.  An injury in which an object goes through the skin and into the liver (penetrating injury), such as a stab or gunshot wound. What are the signs or symptoms? Common symptoms of this condition include:  A swollen and firm abdomen.  Pain in the abdomen.  Tenderness when pressing on the right side of the abdomen. Other symptoms include:  Bleeding from a  penetrating wound.  Bruises on the abdomen.  A fast heartbeat.  Taking quick breaths.  Feeling weak and dizzy. How is this diagnosed? To diagnose this condition, your health care provider will do a physical exam and ask about any injuries to the right side of your abdomen. You may have various tests, such as:  Blood tests. Your blood may be tested every few hours. This will show whether you are losing blood.  CT scan. This test is done to check for laceration or bleeding.  Laparoscopy. This involves placing a small camera into the abdomen and looking directly at the surface of the liver. How is this treated? Treatment depends on how deep the laceration is and how much bleeding you have. Treatment options include:  Monitoring and bed rest at the hospital. You will have tests often.  Receiving donated blood through an IV tube (transfusion) to replace blood that you have lost. You may need several transfusions.  Surgery to pack gauze pads or special material around the laceration to help it heal or to repair the laceration. Follow these instructions at home:  Take over-the-counter and prescription medicines only as told by your health care provider. Do not take any other medicines unless you ask your health care provider about them first.  Do not  drive or use heavy machinery while taking prescription pain medicines.  Rest and limit your activity as told by your health care provider. It may be several months before you can return to your usual routine. Do not participate in activities that involve physical contact or require extra energy until your health care provider approves.  Keep all follow-up visits as told by your health care provider. This is important. Contact a health care provider if:  Your abdominal pain does not go away.  You feel more weak and tired than usual. Get help right away if:  Your abdominal pain gets worse.  You have a cut on your skin that: ? Has more redness, swelling, or pain around it. ? Has more fluid or blood coming from it. ? Feels warm to the touch. ? Has pus or a bad smell coming from it.  You feel dizzy or very weak.  You have trouble breathing.  You have a fever. This information is not intended to replace advice given to you by your health care provider. Make sure you discuss any questions you have with your health care provider. Document Released: 07/08/2010 Document Revised: 05/18/2017 Document Reviewed: 01/21/2016 Elsevier Patient Education  2020 [employer].

## 2018-12-28 NOTE — Progress Notes (Signed)
LE venous duplex       has been completed. Preliminary results can be found under CV proc through chart review. Laurann Mcmorris, BS, RDMS, RVT   
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
## 2018-12-28 NOTE — Progress Notes (Signed)
Patient discharged to inpatient rehab. Patient report phoned to [facility]. Patient belongings transported with patient via nurse tech and RN to room 6.

## 2018-12-28 NOTE — Plan of Care (Signed)
  Problem: Clinical Measurements: Goal: Ability to maintain clinical measurements within normal limits will improve Outcome: Progressing Goal: Will remain free from infection Outcome: Progressing Goal: Diagnostic test results will improve Outcome: Progressing Goal: Respiratory complications will improve Outcome: Progressing Goal: Cardiovascular complication will be avoided Outcome: Progressing   Problem: Clinical Measurements: Goal: Ability to maintain clinical measurements within normal limits will improve Outcome: Progressing   Problem: Pain Managment: Goal: General experience of comfort will improve Outcome: Progressing
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
## 2018-12-29 ENCOUNTER — Inpatient Hospital Stay (HOSPITAL_COMMUNITY): Payer: 59

## 2018-12-29 DIAGNOSIS — G8918 Other acute postprocedural pain: Secondary | ICD-10-CM

## 2018-12-29 DIAGNOSIS — T07XXXA Unspecified multiple injuries, initial encounter: Secondary | ICD-10-CM

## 2018-12-29 DIAGNOSIS — D62 Acute posthemorrhagic anemia: Secondary | ICD-10-CM

## 2018-12-29 LAB — GLUCOSE, CAPILLARY: Glucose-Capillary: 97 mg/dL (ref 70–99)
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
## 2018-12-29 NOTE — Progress Notes (Signed)
Occupational Therapy Session Note  Patient Details  Name: [name] MRN: [id number] Date of Birth: [birth year]  Today's Date: 12/29/2018 OT Individual Time: 1500-1540 OT Individual Time Calculation (min): 40 min  10 min missed 2/2 pain  Short Term Goals: Week 1:  OT Short Term Goal 1 (Week 1): STG= LTG d/t ELOS  Skilled Therapeutic Interventions/Progress Updates:    Pt received supine c/o back aching but willing to complete session and requesting to wash hair at the sink. Care coordination completed with PT re PRAFO vs. Cam boot for in bed to increase R ankle ROM, will f/u with MD for precaution clarification. Pt initiated bed mobility to EOB. Pt required increased amount of time, moving inch by inch with a rest break each time 2/2 pain. Pt eventually made it to EOB but upon beginning to lower RLE to ground pt was screaming in pain and crying. Pt returned to supine and was given emotional support. Discussed pain medication use before therapy and provided encouragement. Pt left supine with all needs met, bed alarm set.   Therapy Documentation Precautions:  Precautions Precautions: Fall, Back Precaution Comments: Back precautions for comfort (thoracic/lumbar transverse process fxs) Required Braces or Orthoses: Other Brace Splint/Cast: R foot- CAM boot Restrictions Weight Bearing Restrictions: Yes RLE Weight Bearing: Weight bearing as tolerated General: General OT Amount of Missed Time: 10 Minutes   Therapy/Group: Individual Therapy  Curtis Sites 12/29/2018, 4:08 PM
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
## 2018-12-30 ENCOUNTER — Inpatient Hospital Stay (HOSPITAL_COMMUNITY): Payer: 59 | Admitting: Occupational Therapy

## 2018-12-30 ENCOUNTER — Inpatient Hospital Stay (HOSPITAL_COMMUNITY): Payer: 59 | Admitting: Physical Therapy

## 2018-12-30 DIAGNOSIS — E46 Unspecified protein-calorie malnutrition: Secondary | ICD-10-CM

## 2018-12-30 DIAGNOSIS — E8809 Other disorders of plasma-protein metabolism, not elsewhere classified: Secondary | ICD-10-CM

## 2018-12-30 LAB — COMPREHENSIVE METABOLIC PANEL
ALT: 34 U/L (ref 0–44)
AST: 22 U/L (ref 15–41)
Albumin: 3.3 g/dL — ABNORMAL LOW (ref 3.5–5.0)
Alkaline Phosphatase: 86 U/L (ref 38–126)
Anion gap: 10 (ref 5–15)
BUN: 11 mg/dL (ref 6–20)
CO2: 23 mmol/L (ref 22–32)
Calcium: 9.2 mg/dL (ref 8.9–10.3)
Chloride: 104 mmol/L (ref 98–111)
Creatinine, Ser: 0.59 mg/dL (ref 0.44–1.00)
GFR calc Af Amer: >60 mL/min (ref >60)
GFR calc non Af Amer: >60 mL/min (ref >60)
Glucose, Bld: 103 mg/dL — ABNORMAL HIGH (ref 70–99)
Potassium: 3.7 mmol/L (ref 3.5–5.1)
Sodium: 137 mmol/L (ref 135–145)
Total Bilirubin: 1.5 mg/dL — ABNORMAL HIGH (ref 0.3–1.2)
Total Protein: 6.9 g/dL (ref 6.5–8.1)

## 2018-12-30 LAB — CBC WITH DIFFERENTIAL/PLATELET
Abs Immature Granulocytes: 0.17 10*3/uL — ABNORMAL HIGH (ref 0.00–0.07)
Basophils Absolute: 0 10*3/uL (ref 0.0–0.1)
Basophils Relative: 0 %
Eosinophils Absolute: 0.1 10*3/uL (ref 0.0–0.5)
Eosinophils Relative: 1 %
HCT: 33.2 % — ABNORMAL LOW (ref 36.0–46.0)
Hemoglobin: 10.5 g/dL — ABNORMAL LOW (ref 12.0–15.0)
Immature Granulocytes: 2 %
Lymphocytes Relative: 18 %
Lymphs Abs: 1.8 10*3/uL (ref 0.7–4.0)
MCH: 28.2 pg (ref 26.0–34.0)
MCHC: 31.6 g/dL (ref 30.0–36.0)
MCV: 89 fL (ref 80.0–100.0)
Monocytes Absolute: 0.5 10*3/uL (ref 0.1–1.0)
Monocytes Relative: 5 %
Neutro Abs: 6.9 10*3/uL (ref 1.7–7.7)
Neutrophils Relative %: 74 %
Platelets: 284 10*3/uL (ref 150–400)
RBC: 3.73 MIL/uL — ABNORMAL LOW (ref 3.87–5.11)
RDW: 16.5 % — ABNORMAL HIGH (ref 11.5–15.5)
WBC: 9.5 10*3/uL (ref 4.0–10.5)
nRBC: 0 % (ref 0.0–0.2)

## 2018-12-30 MED ORDER — PRO-STAT SUGAR FREE PO LIQD
30.0000 mL | Freq: Two times a day (BID) | ORAL | Status: DC
  Administered 2018-12-30 – 2019-01-03 (×8): 30 mL via ORAL
  Filled 2018-12-30 (×9): qty 30
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
## 2018-12-30 NOTE — Progress Notes (Signed)
Physical Therapy Session Note  Patient Details  Name: [name] MRN: [id number] Date of Birth: April 13, 1986  Today's Date: 12/30/2018 PT Individual Time: 1045-1140 PT Individual Time Calculation (min): 55 min   Short Term Goals: Week 1:  PT Short Term Goal 1 (Week 1): STG=LTG due to ELOS  Skilled Therapeutic Interventions/Progress Updates:    pt states increased pain and stiffness in Rt ankle and asks to stretch it out.  Pt gentle AAROM to Rt ankle as tolerated.  Pt performs sit <> stand with CGA.  Gait 20', 10' with RW and frequent standing rest breaks due to pain, supervision.  Pt performs w/c mobility 150' x 2 with supervision, increased time for obstacle negotiation.  UE therex with theraband for shoulder and elbow strengthening. Pt performs sit to supine with min A for Rt LE. Pt left with all needs at hand, RN present.  Therapy Documentation Precautions:  Precautions Precautions: Fall, Back Precaution Comments: Back precautions for comfort (thoracic/lumbar transverse process fxs) Required Braces or Orthoses: Other Brace Splint/Cast: R foot- CAM boot Restrictions Weight Bearing Restrictions: Yes RLE Weight Bearing: Weight bearing as tolerated Pain:  pt c/o 8/10 pain in Rt ankle, RN made aware and meds given during session   Therapy/Group: Individual Therapy  Fatima Fedie 12/30/2018, 11:43 AM

## 2018-12-30 NOTE — Progress Notes (Signed)
Occupational Therapy Session Note  Patient Details  Name: [name] MRN: [id number] Date of Birth: 31-May-1986  Today's Date: 12/30/2018 OT Individual Time: 4166-0630 OT Individual Time Calculation (min): 70 min    Short Term Goals: Week 1:  OT Short Term Goal 1 (Week 1): STG= LTG d/t ELOS  Skilled Therapeutic Interventions/Progress Updates:    Pt greeted seated in wc and agreeable to OT treatment session. Pt propelled wc to therapy gym with increased time. UB strengthening with Sci[employer] arm bike on level 1.5 for 10 mins. 2 rest breaks within 10 mins of work. Discussed home bathroom set-up and practiced tub bench transfer using RW for Stand-pivot with min A. Pt sat edge of tub bench, and declined trying to lift R LE over ledge 2/2 pain. Pt completed stand-pivot back to wc w/ RW and min A. Worked on sit<>stand and standing tolerance. Pt tolerated standing for 5 mins w/ RW while playing connect 4 game. Worked on weight shifting onto R side with some improved tolerance, but pt states her R heel is up in the boot and she was on her tip toes. Pt tolerated 2nd stand for 5 mins again. Pt propelled wc back to room w/ supervision. Pt handoff to PT.  Therapy Documentation Precautions:  Precautions Precautions: Fall, Back Precaution Comments: Back precautions for comfort (thoracic/lumbar transverse process fxs) Required Braces or Orthoses: Other Brace Splint/Cast: R foot- CAM boot Restrictions Weight Bearing Restrictions: Yes RLE Weight Bearing: Weight bearing as tolerated Pain: Pain Assessment Pain Scale: 0-10 Pain Score: 3  Pain Type: Acute pain Pain Location: Ankle Pain Orientation: Right Pain Radiating Towards: leg Pain Descriptors / Indicators: Aching;Sharp Pain Frequency: Intermittent Pain Onset: On-going Pain Intervention(s): Repositioned  Therapy/Group: Individual Therapy  Valma Cava 12/30/2018, 9:53 AM

## 2018-12-30 NOTE — Progress Notes (Signed)
Occupational Therapy Session Note  Patient Details  Name: EILAH COMMON MRN: [id number] Date of Birth: 04-15-1986  Today's Date: 12/30/2018 OT Individual Time: 7672-0947 OT Individual Time Calculation (min): 60 min    Short Term Goals: Week 1:  OT Short Term Goal 1 (Week 1): STG= LTG d/t ELOS  Skilled Therapeutic Interventions/Progress Updates:    Treatment session with focus on bed mobility, stand pivot transfers, and self-care retraining.  Pt received upright in bed reporting pain in foot 3/10 at rest, but increased to 7-8/10 during movement.  Pt completed bed mobility with increased time and frequent rest breaks, advancing RLE to EOB without assistance.  Therapist assisted in lowering RLE to floor slowly to allow for increased activity tolerance.  Pt completed sit > stand from EOB with CGA with RW.  Stand pivot transfer with RW, pt not bearing weight through RLE during transfer due to pain.  Completed grooming tasks in sitting at sink and engaged in washing hair. Pt reports pain in back from positioning during hair wash.  Pt repositioned and remained upright in w/c with all needs in reach.  Therapy Documentation Precautions:  Precautions Precautions: Fall, Back Precaution Comments: Back precautions for comfort (thoracic/lumbar transverse process fxs) Required Braces or Orthoses: Other Brace Splint/Cast: R foot- CAM boot Restrictions Weight Bearing Restrictions: Yes RLE Weight Bearing: Weight bearing as tolerated General:   Vital Signs: Therapy Vitals Temp: 97.6 F (36.4 C) Temp Source: Oral Pulse Rate: 76 Resp: 16 BP: 108/67 Patient Position (if appropriate): Lying Oxygen Therapy SpO2: 96 % O2 Device: Room Air Pain: Pain Assessment Pain Scale: 0-10 Pain Score: 3  Pain Type: Acute pain Pain Location: Ankle Pain Orientation: Right Pain Radiating Towards: leg Pain Descriptors / Indicators: Aching;Sharp Pain Frequency: Intermittent Pain Onset: On-going Pain  Intervention(s): Medication (See eMAR)   Therapy/Group: Individual Therapy  Simonne Come 12/30/2018, 8:59 AM

## 2018-12-30 NOTE — Progress Notes (Signed)
Inpatient Rehabilitation  Patient information reviewed and entered into eRehab system by Kyriana Yankee M. Lanice Folden, M.A., CCC/SLP, PPS Coordinator.  Information including medical coding, functional ability and quality indicators will be reviewed and updated through discharge.    

## 2018-12-31 ENCOUNTER — Inpatient Hospital Stay (HOSPITAL_COMMUNITY): Payer: 59

## 2018-12-31 ENCOUNTER — Inpatient Hospital Stay (HOSPITAL_COMMUNITY): Payer: 59 | Admitting: Occupational Therapy

## 2018-12-31 ENCOUNTER — Inpatient Hospital Stay (HOSPITAL_COMMUNITY): Payer: 59 | Admitting: Physical Therapy

## 2018-12-31 DIAGNOSIS — K5903 Drug induced constipation: Secondary | ICD-10-CM

## 2018-12-31 DIAGNOSIS — G8918 Other acute postprocedural pain: Secondary | ICD-10-CM

## 2018-12-31 MED ORDER — TUBERCULIN PPD 5 UNIT/0.1ML ID SOLN
5.0000 [IU] | Freq: Once | INTRADERMAL | Status: AC
  Administered 2018-12-31: 5 [IU] via INTRADERMAL
  Filled 2018-12-31: qty 0.1

## 2018-12-31 MED ORDER — GABAPENTIN 300 MG PO CAPS
300.0000 mg | ORAL_CAPSULE | Freq: Three times a day (TID) | ORAL | Status: DC
  Administered 2018-12-31 – 2019-01-03 (×8): 300 mg via ORAL
  Filled 2018-12-31 (×9): qty 1
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
## 2018-12-31 NOTE — Progress Notes (Signed)
Inpatient Rehabilitation Center Individual Statement of Services  Patient Name:  [name]  Date:  12/31/2018  Welcome to the [facility].  Our goal is to provide you with an individualized program based on your diagnosis and situation, designed to meet your specific needs.  With this comprehensive rehabilitation program, you will be expected to participate in at least 3 hours of rehabilitation therapies Monday-Friday, with modified therapy programming on the weekends.  Your rehabilitation program will include the following services:  Physical Therapy (PT), Occupational Therapy (OT), 24 hour per day rehabilitation nursing, Neuropsychology, Case Management (Social Worker), Rehabilitation Medicine, Nutrition Services and Pharmacy Services  Weekly team conferences will be held on Wednesdays to discuss your progress.  Your Social Worker will talk with you frequently to get your input and to update you on team discussions.  Team conferences with you and your family in attendance may also be held.  Expected length of stay: 5 to 9 days  Overall anticipated outcome: Supervision  Depending on your progress and recovery, your program may change. Your Social Worker will coordinate services and will keep you informed of any changes. Your Social Worker's name and contact numbers are listed  below.  The following services may also be recommended but are not provided by the [facility] will be made to provide these services after discharge if needed.  Arrangements include referral to agencies that provide these services.  Your insurance has been verified to be:  [employer] Your primary doctor is: Mina Marble, NP  Pertinent information will be shared with your doctor and your insurance company.  Social Worker:   Alfonse Alpers, LCSW  [phone] or [phone]  Information discussed with and copy given to patient by: Trey Sailors, 12/31/2018, 12:23 PM

## 2018-12-31 NOTE — Progress Notes (Signed)
Physical Therapy Session Note  Patient Details  Name: [name] MRN: [id number] Date of Birth: 08-05-1985  Today's Date: 12/31/2018 PT Individual Time: 1300-1400 PT Individual Time Calculation (min): 60 min   Short Term Goals: Week 1:  PT Short Term Goal 1 (Week 1): STG=LTG due to ELOS  Skilled Therapeutic Interventions/Progress Updates:     Pt seated in recliner upon PT arrival, agreeable to therapy tx and reports 2/10 pain in the R LE. Pt transferred to standing with CGA and ambulated x 5 ft to the w/c with RW and CGA. Pt propelled w/c x 150 ft with supervision using B UEs working on UE endurance and strength. Pt performed stand pivot this session from w/c<>mat with RW and CGA. PT transferred to supine on mat with min assist for R LE management, pt with 10/10 pain once on the mat and unable to tolerate R LE being flat on the mat, therapist attempted to reposition however pt continues to scream out. Pt transferred to long sitting and therapist supported R LE, in this position pt performed R LE active ankle ROM and then therapist performed ankle PROM. Pt assisted back to sitting EOB with min assist, pt continues to scream out in pain at times. Pt transferred back to w/c and transported back to room. Therapist suggested trying exercises in the bed where she is more comfortable. Pt transferred to bed with CGA and RW stand pivot. Pt transferred to supine with min assist. Pt able to perform R LE therex and ROM with leg elevated in the bed for pain management. Pt performed 2 x 10 of each with R LE for strengthening: partial heel slides and hip abduction/adduction; performed 2 x 5 active assisted straight leg raises through partial range with R LE, limited by pain. Pt left supine in bed at end of session with needs in reach and bed alarm set.     Therapy Documentation Precautions:  Precautions Precautions: Fall, Back Precaution Comments: Back precautions for comfort (thoracic/lumbar transverse  process fxs) Required Braces or Orthoses: Other Brace Splint/Cast: R foot- CAM boot Restrictions Weight Bearing Restrictions: Yes RLE Weight Bearing: Weight bearing as tolerated    Therapy/Group: Individual Therapy  Netta Corrigan, PT, DPT 12/31/2018, 7:57 AM

## 2018-12-31 NOTE — Progress Notes (Signed)
Occupational Therapy Session Note  Patient Details  Name: [name] MRN: [id number] Date of Birth: 07/18/1985  Today's Date: 12/31/2018 OT Individual Time: 7065-8260 OT Individual Time Calculation (min): 71 min   Short Term Goals: Week 1:  OT Short Term Goal 1 (Week 1): STG= LTG d/t ELOS  Skilled Therapeutic Interventions/Progress Updates:    Pt greeted seated in wc complaining of a headache but agreeable to OT treatment session focused on self-care retraining. OT covered R CAM boot with water proof dressing, then pt transferred into shower using grab bars and min A. Bathing completed with min A to wash buttocks in standing as she was unable to reach buttocks via leaning method 2/2 body habitus. Worked on modified dressing strategies using reacher with pt able to thread pant legs today, sit<>stand with CGA and pull up pants. Grooming tasks completed wc at the sink 2/2 pain in standing. B UE strength with wc propulsion to therapy gym. OT issues pt a RW bag and educated on functional uses. Worked on sit<>stand and standing balance with card matching reaching activity from RW bag. Pt returned to room in wc and completed stand-pivot to recliner with CGA. PT left seated in recliner with chair alarm on and needs met.  Therapy Documentation Precautions:  Precautions Precautions: Fall, Back Precaution Comments: Back precautions for comfort (thoracic/lumbar transverse process fxs) Required Braces or Orthoses: Other Brace Splint/Cast: R foot- CAM boot Restrictions Weight Bearing Restrictions: Yes RLE Weight Bearing: Weight bearing as tolerated Pain: Pain Assessment Pain Scale: 0-10 Pain Score: 4  Pain Type: Acute pain Pain Location: Head Pain Descriptors / Indicators: Aching;Headache Pain Onset: On-going Pain Intervention(s): Other (Comment);Repositioned(shower) Multiple Pain Sites: Yes 2nd Pain Site Pain Score: 4 Pain Type: Acute pain Pain Location: Ankle Pain Orientation:  Right Pain Descriptors / Indicators: Aching Pain Onset: On-going Pain Intervention(s): Repositioned     Therapy/Group: Individual Therapy  Valma Cava 12/31/2018, 10:29 AM

## 2018-12-31 NOTE — Progress Notes (Signed)
Physical Therapy Session Note  Patient Details  Name: [name] MRN: [id number] Date of Birth: 11/03/1985  Today's Date: 12/31/2018 PT Individual Time: 0800-0856 PT Individual Time Calculation (min): 56 min   Short Term Goals: Week 1:  PT Short Term Goal 1 (Week 1): STG=LTG due to ELOS  Skilled Therapeutic Interventions/Progress Updates:   Pt in supine and agreeable to therapy, pain 2-3/10 in RLE at rest (premedicated). Pain reports migraine but does not rate, reports migraines are normal for her. Supine>sit w/ supervision and significantly increased time 2/2 pain w/ movement. Pain 5/10 w/ movement but pt yelling out in pain. Stand pivot to w/c w/ CGA using RW. Pt self-propelled w/c to/from therapy gym w/ supervision using BUEs. Discussed w/c mobility in her home and being able to maneuver chair in main living area but using RW to enter bedrooms/bathroom. Pt reports that shouldn't be a problem. Pt also reports that her husband will be building a ramp this week. Worked on gait and endurance w/ functional mobility, ambulated 30' and 72' w/ close supervision using RW and frequent standing rest breaks. Pt continues to put minimal weight on RLE 2/2 pain guarding. Returned to room and ended session in w/c, all needs in reach. Ice applied to R anterior ankle for swelling and pain relief.   Therapy Documentation Precautions:  Precautions Precautions: Fall, Back Precaution Comments: Back precautions for comfort (thoracic/lumbar transverse process fxs) Required Braces or Orthoses: Other Brace Splint/Cast: R foot- CAM boot Restrictions Weight Bearing Restrictions: Yes RLE Weight Bearing: Weight bearing as tolerated Vital Signs: Therapy Vitals Temp: 97.9 F (36.6 C) Temp Source: Oral Pulse Rate: 79 Resp: 20 BP: 108/63 Patient Position (if appropriate): Lying Oxygen Therapy SpO2: 93 % O2 Device: Room Air  Therapy/Group: Individual Therapy  Amariyah Bazar K Sencere Symonette 12/31/2018, 9:00 AM

## 2019-01-01 ENCOUNTER — Inpatient Hospital Stay (HOSPITAL_COMMUNITY): Payer: 59 | Admitting: Physical Therapy

## 2019-01-01 ENCOUNTER — Encounter (HOSPITAL_COMMUNITY): Payer: 59 | Admitting: Psychology

## 2019-01-01 ENCOUNTER — Inpatient Hospital Stay (HOSPITAL_COMMUNITY): Payer: 59 | Admitting: Occupational Therapy

## 2019-01-01 ENCOUNTER — Inpatient Hospital Stay (HOSPITAL_COMMUNITY): Payer: 59

## 2019-01-01 ENCOUNTER — Other Ambulatory Visit: Payer: Self-pay

## 2019-01-01 ENCOUNTER — Encounter (HOSPITAL_COMMUNITY): Payer: Self-pay

## 2019-01-01 DIAGNOSIS — K5909 Other constipation: Secondary | ICD-10-CM

## 2019-01-01 NOTE — Progress Notes (Signed)
Occupational Therapy Session Note  Patient Details  Name: [name] MRN: [id number] Date of Birth: 13-Jun-1986  Today's Date: 01/01/2019 OT Individual Time: 5631-4970 OT Individual Time Calculation (min): 60 min    Short Term Goals: Week 1:  OT Short Term Goal 1 (Week 1): STG= LTG d/t ELOS  Skilled Therapeutic Interventions/Progress Updates:    Pt seen this session for ADL training with a focus on sit to stand and use of AE.  Pt used reacher as needed with LB  Dressing. Her biggest struggle today was reaching to her bottom. She can wash her buttocks but cannot get between them to fully cleanse.  She tried in standing but she tenses her bottom too much and cannot reach.  Pt then tried from sitting on toilet. She scooted forward to edge of toilet but still could not reach well.  Provided pt with toilet tongs to try. Explained how to wrap the paper and dispose of it easily.  Pt was able to stand for a while but she was having increased pain throughout the session.   Pt ambulated to recliner to rest. Set pt up with pillows for support.  Call light and phone in reach. Seat alarm on.   Therapy Documentation Precautions:  Precautions Precautions: Fall, Back Precaution Comments: Back precautions for comfort (thoracic/lumbar transverse process fxs) Required Braces or Orthoses: Other Brace Splint/Cast: R foot- CAM boot Restrictions Weight Bearing Restrictions: Yes RLE Weight Bearing: Weight bearing as tolerated       Pain:  2/10 in R leg    Therapy/Group: Individual Therapy  [place] 01/01/2019, 12:07 PM
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
## 2019-01-01 NOTE — Progress Notes (Signed)
Physical Therapy Session Note  Patient Details  Name: [name] MRN: [id number] Date of Birth: 1986-02-14  Today's Date: 01/01/2019 PT Individual Time: 3664-4034 AND 7425-9563 PT Individual Time Calculation (min): 55 min AND 30 min  Short Term Goals: Week 1:  PT Short Term Goal 1 (Week 1): STG=LTG due to ELOS  Skilled Therapeutic Interventions/Progress Updates:   Session 1:  Pt seated EOB and agreeable to therapy, pain as detailed below. States her migraine is better today. Stand pivot transfer to w/c w/ supervision. Pt began to propel herself to therapy gym when reporting she needed to toilet. Returned to room and performed toilet transfer to [facility] w/ close supervision, supervision for pericare and LE garment management. Pt self-propelled w/c to/from therapy gym w/ supervision using BUEs. Worked on RLE strengthening and gentle ROM in gym. Needed max encouragement to take RLE out of boot to work on active ROM and gentle stretching. Pt very fearful of all movement, verbal cues to relax all muscles and for breathing strategies through the pain of movement. Pain 6-7/10 w/ activity, returns to 3/10 during rest breaks. Exercises included ankle pumps 2x10, LAQs 2x10, and passive DF stretch 15 sec x5 reps. Pt still lacking 10-15 deg of ankle DF. Needed frequent rest breaks 2/2 muscle spasms in RLE w/ AROM. Returned to room and ended session in w/c, all needs in reach. Ice applied to R anterior ankle for pain relief and swelling.   Session 2:  Pt in w/c and agreeable to therapy, pain 4-5/10 in RLE. Stand pivot transfer to EOB w/ CGA-close supervision and sit>supine w/ supervision and no physicla assistance needed to manage RLE onto bed. Performed RLE strengthening and ROM exercises in reclined supine including SLR 3x10, ankle pumps 2x10, and abduction slides 2x10. Ended session in reclined supine, all needs in reach. Ice applied to R anterior ankle for pain and swelling relief.   Therapy  Documentation Precautions:  Precautions Precautions: Fall, Back Precaution Comments: Back precautions for comfort (thoracic/lumbar transverse process fxs) Required Braces or Orthoses: Other Brace Splint/Cast: R foot- CAM boot Restrictions Weight Bearing Restrictions: Yes RLE Weight Bearing: Weight bearing as tolerated Vital Signs: Therapy Vitals Temp: 98.4 F (36.9 C) Temp Source: Oral Pulse Rate: 73 Resp: 17 BP: 124/67 Patient Position (if appropriate): Lying Oxygen Therapy SpO2: 96 % O2 Device: Room Air Pain: Pain Assessment Pain Scale: 0-10 Pain Score: 0-No pain  Therapy/Group: Individual Therapy  Angeni Chaudhuri Clent Demark 01/01/2019, 8:56 AM

## 2019-01-01 NOTE — Progress Notes (Signed)
Occupational Therapy Session Note  Patient Details  Name: [name] MRN: [id number] Date of Birth: 1985-10-15  Today's Date: 01/01/2019 OT Individual Time: 0973-5329 OT Individual Time Calculation (min): 40 min    Short Term Goals: Week 1:  OT Short Term Goal 1 (Week 1): STG= LTG d/t ELOS  Skilled Therapeutic Interventions/Progress Updates:    1:1. Pt received in recliner with pain being "manageable." Pt completes stand pivot transfer to w/c with RW and CGA. Pt propels w/c to/from tx destinations with supervision. Pt completes 8 min UE ergometer for BUE endurance required for community distance functional mobility on level 10 with VC for keeping PRM >45. Pt completes 4# dowel rod HEP for global strenghtening required for BADLs and functional transfers. Exited session with pt seated in w/c with call light in reach and all needs met  Therapy Documentation Precautions:  Precautions Precautions: Fall, Back Precaution Comments: Back precautions for comfort (thoracic/lumbar transverse process fxs) Required Braces or Orthoses: Other Brace Splint/Cast: R foot- CAM boot Restrictions Weight Bearing Restrictions: Yes RLE Weight Bearing: Weight bearing as tolerated General:   Vital Signs: Therapy Vitals Temp: 98.2 F (36.8 C) Temp Source: Oral Pulse Rate: 92 Resp: 16 BP: 112/77 Patient Position (if appropriate): Sitting Oxygen Therapy SpO2: 99 % O2 Device: Room Air Pain:   ADL:   Vision   Perception    Praxis   Exercises:   Other Treatments:     Therapy/Group: Individual Therapy  Tonny Branch 01/01/2019, 2:33 PM

## 2019-01-01 NOTE — Plan of Care (Signed)
Problem: Education: Goal: Knowledge of General Education information will improve Description: Including pain rating scale, medication(s)/side effects and non-pharmacologic comfort measures 01/01/2019 1613 by Glean Salen, RN Outcome: Progressing 01/01/2019 1609 by Glean Salen, RN Outcome: Progressing   Problem: Health Behavior/Discharge Planning: Goal: Ability to manage health-related needs will improve 01/01/2019 1613 by Glean Salen, RN Outcome: Progressing 01/01/2019 1609 by Glean Salen, RN Outcome: Progressing   Problem: Clinical Measurements: Goal: Ability to maintain clinical measurements within normal limits will improve 01/01/2019 1613 by Glean Salen, RN Outcome: Progressing 01/01/2019 1609 by Glean Salen, RN Outcome: Progressing Goal: Will remain free from infection 01/01/2019 1613 by Glean Salen, RN Outcome: Progressing 01/01/2019 1609 by Glean Salen, RN Outcome: Progressing Goal: Diagnostic test results will improve 01/01/2019 1613 by Glean Salen, RN Outcome: Progressing 01/01/2019 1609 by Glean Salen, RN Outcome: Progressing Goal: Respiratory complications will improve 01/01/2019 1613 by Glean Salen, RN Outcome: Progressing 01/01/2019 1609 by Glean Salen, RN Outcome: Progressing Goal: Cardiovascular complication will be avoided 01/01/2019 1613 by Glean Salen, RN Outcome: Progressing 01/01/2019 1609 by Glean Salen, RN Outcome: Progressing   Problem: Activity: Goal: Risk for activity intolerance will decrease 01/01/2019 1613 by Glean Salen, RN Outcome: Progressing 01/01/2019 1609 by Glean Salen, RN Outcome: Progressing   Problem: Nutrition: Goal: Adequate nutrition will be maintained 01/01/2019 1613 by Glean Salen, RN Outcome: Progressing 01/01/2019 1609 by Glean Salen, RN Outcome: Progressing   Problem: Coping: Goal: Level of anxiety will decrease 01/01/2019 1613 by Glean Salen, RN Outcome:  Progressing 01/01/2019 1609 by Glean Salen, RN Outcome: Progressing   Problem: Elimination: Goal: Will not experience complications related to bowel motility 01/01/2019 1613 by Glean Salen, RN Outcome: Progressing 01/01/2019 1609 by Glean Salen, RN Outcome: Progressing Goal: Will not experience complications related to urinary retention 01/01/2019 1613 by Glean Salen, RN Outcome: Progressing 01/01/2019 1609 by Glean Salen, RN Outcome: Progressing   Problem: Pain Managment: Goal: General experience of comfort will improve 01/01/2019 1613 by Glean Salen, RN Outcome: Progressing 01/01/2019 1609 by Glean Salen, RN Outcome: Progressing   Problem: Safety: Goal: Ability to remain free from injury will improve 01/01/2019 1613 by Glean Salen, RN Outcome: Progressing 01/01/2019 1609 by Glean Salen, RN Outcome: Progressing   Problem: Skin Integrity: Goal: Risk for impaired skin integrity will decrease 01/01/2019 1613 by Glean Salen, RN Outcome: Progressing 01/01/2019 1609 by Glean Salen, RN Outcome: Progressing   Problem: Consults Goal: RH GENERAL PATIENT EDUCATION Description: See Patient Education module for education specifics. Outcome: Progressing Goal: Skin Care Protocol Initiated - if Braden Score 18 or less Description: If consults are not indicated, leave blank or document N/A Outcome: Progressing Goal: Nutrition Consult-if indicated Outcome: Progressing Goal: Diabetes Guidelines if Diabetic/Glucose > 140 Description: If diabetic or lab glucose is > 140 mg/dl - Initiate Diabetes/Hyperglycemia Guidelines & Document Interventions  Outcome: Progressing   Problem: RH BOWEL ELIMINATION Goal: RH STG MANAGE BOWEL WITH ASSISTANCE Description: STG Manage Bowel with Assistance. Outcome: Progressing Flowsheets (Taken 01/01/2019 1613) STG: Pt will manage bowels with assistance: 4-Minimum assistance   Problem: RH SKIN INTEGRITY Goal: RH STG SKIN FREE OF  INFECTION/BREAKDOWN Outcome: Progressing Goal: RH STG ABLE TO PERFORM INCISION/WOUND CARE W/ASSISTANCE Description: STG Able To Perform Incision/Wound Care With Assistance. Outcome: Progressing Flowsheets (Taken 01/01/2019 1613) STG: Pt will be able to perform incision/wound care with assistance: 4-Minimal assistance  Problem: RH SAFETY Goal: RH STG ADHERE TO SAFETY PRECAUTIONS W/ASSISTANCE/DEVICE Description: STG Adhere to Safety Precautions With Assistance/Device. Outcome: Progressing Flowsheets (Taken 01/01/2019 1613) STG:Pt will adhere to safety precautions with assistance/device: 4-Minimal assistance Goal: RH STG DECREASED RISK OF FALL WITH ASSISTANCE Description: STG Decreased Risk of Fall With Assistance. Outcome: Progressing Flowsheets (Taken 01/01/2019 1613) [id number] risk of fall  with assistance/device: 4-Minimal assistance   Problem: RH PAIN MANAGEMENT Goal: RH STG PAIN MANAGED AT OR BELOW PT'S PAIN GOAL Outcome: Progressing   Problem: RH KNOWLEDGE DEFICIT GENERAL Goal: RH STG INCREASE KNOWLEDGE OF SELF CARE AFTER HOSPITALIZATION Outcome: Progressing
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
## 2019-01-02 ENCOUNTER — Inpatient Hospital Stay (HOSPITAL_COMMUNITY): Payer: 59 | Admitting: Occupational Therapy

## 2019-01-02 ENCOUNTER — Inpatient Hospital Stay (HOSPITAL_COMMUNITY): Payer: 59 | Admitting: Physical Therapy

## 2019-01-02 DIAGNOSIS — F4310 Post-traumatic stress disorder, unspecified: Secondary | ICD-10-CM

## 2019-01-02 NOTE — Progress Notes (Signed)
Social Work Patient ID: [name], female   DOB: 1986-04-23, 33 y.o.   MRN: [id number]   CSW met with pt 01-01-19 to update her on team conference discussion and targeted d/c date of 01-04-19.  Pt was pleased with this and plans to call her husband to inform him.  CSW offered to do so, but she said he was finishing the ramp and she wanted to share the news with him.  CSW will arrange [facility] and order DME.  CSW will continue to follow and assist as needed.

## 2019-01-02 NOTE — Plan of Care (Signed)
  Problem: Consults Goal: RH GENERAL PATIENT EDUCATION Description: See Patient Education module for education specifics. Outcome: Progressing   Problem: RH BOWEL ELIMINATION Goal: RH STG MANAGE BOWEL WITH ASSISTANCE Description: STG Manage Bowel with Assistance. Outcome: Progressing   Problem: RH SKIN INTEGRITY Goal: RH STG SKIN FREE OF INFECTION/BREAKDOWN Outcome: Progressing Goal: RH STG ABLE TO PERFORM INCISION/WOUND CARE W/ASSISTANCE Description: STG Able To Perform Incision/Wound Care With Assistance. Outcome: Progressing   Problem: RH SAFETY Goal: RH STG ADHERE TO SAFETY PRECAUTIONS W/ASSISTANCE/DEVICE Description: STG Adhere to Safety Precautions With Assistance/Device. Outcome: Progressing   Problem: RH PAIN MANAGEMENT Goal: RH STG PAIN MANAGED AT OR BELOW PT'S PAIN GOAL Outcome: Progressing   Problem: RH KNOWLEDGE DEFICIT GENERAL Goal: RH STG INCREASE KNOWLEDGE OF SELF CARE AFTER HOSPITALIZATION Outcome: Progressing   Problem: Consults Goal: Diabetes Guidelines if Diabetic/Glucose > 140 Description: If diabetic or lab glucose is > 140 mg/dl - Initiate Diabetes/Hyperglycemia Guidelines & Document Interventions  Outcome: Not Applicable   Problem: RH SAFETY Goal: RH STG DECREASED RISK OF FALL WITH ASSISTANCE Description: STG Decreased Risk of Fall With Assistance. Outcome: Not Applicable
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
## 2019-01-02 NOTE — Progress Notes (Signed)
Occupational Therapy Session Note  Patient Details  Name: [name] MRN: [id number] Date of Birth: [birth year]  Today's Date: 01/02/2019  Session 1 OT Individual Time: 7921-7837 OT Individual Time Calculation (min): 43 min   Session 2 OT Individual Time: 5423-7023 OT Individual Time Calculation (min): 26 min    Short Term Goals: Week 1:  OT Short Term Goal 1 (Week 1): STG= LTG d/t ELOS  Skilled Therapeutic Interventions/Progress Updates:  Session1   Pt greeted semi-reclined in bed and agreeable to OT treatment session. Pt reported need to go to the bathroom. Pt needed min A to bring R LE to EOB. Discussed bed mobility and the need for pt to practice getting up from flat bed. Pt stood with supervision. She ambulated into bathroom with 1 posterior LOB requiring min A to correct. Pt transferred onto toilet with supervision. Pt voided bladder and was able to use toilet extension with supervision. Pt declined to shower today but agreeable to wash up at the sink. UB bathing/dressing completed without OT assist. OT educated on use of LH sponge to wash buttocks and LLE, but unable to issue one to pt 2/2 out of stock. OT had pt try using toilet tongs with wash cloth and pt successfully able to wash buttocks with supervision. LB dressing completed with use of reacher and supervision. Pt ambulated to recliner with RW, close supervision and no LOB. Pt left with chair alarm on and needs met.   Session 2 Pt greeted seated in recliner and agreeable to OT treatment session. OT educated pt on donning/doffing CAM boot and had pt practice directing care as she can't bend forward 2/2 back precautions. Pt ambulated 10 feet to wc w/ RW and supervision. Pt then propelled wc to therapy gym with 1 rest break. Worked on standing balance and R LE strength with standing toe tap on small cone. Pt completed 3 sets of 10. Pt propelled wc back to room w/ Supervision and stand-pivot back to recliner w/ RW and supervision.    Therapy Documentation Precautions:  Precautions Precautions: Fall, Back Precaution Comments: Back precautions for comfort (thoracic/lumbar transverse process fxs) Required Braces or Orthoses: Other Brace Splint/Cast: R foot- CAM boot Restrictions Weight Bearing Restrictions: Yes RLE Weight Bearing: Weight bearing as tolerated Pain:  no number stated, said pain was tolerable  Therapy/Group: Individual Therapy  Valma Cava 01/02/2019, 12:50 PM

## 2019-01-02 NOTE — Progress Notes (Signed)
Physical Therapy Session Note  Patient Details  Name: [name] MRN: [id number] Date of Birth: 03/03/1986  Today's Date: 01/02/2019 PT Individual Time: 1000-1045 AND 1031-5945 PT Individual Time Calculation (min): 45 min AND 69 min  Short Term Goals: Week 1:  PT Short Term Goal 1 (Week 1): STG=LTG due to ELOS  Skilled Therapeutic Interventions/Progress Updates:   Session 1:  Pt in recliner and agreeable to therapy, pain 4/10 in RLE. Stand pivot to w/c w/ supervision. Removed ACE wrap from RLE used for compression for swelling, pt stated it was hurting more today because of that. Pt self-propelled w/c to/from therapy gym w/ BUEs to work on endurance and UE strengthening. NuStep 3 min x3 @ level 1. Worked on gentle ROM of RLE, increasing tolerance to WB of RLE, and for global strengthening and endurance. Pt moving RLE in minimal knee flexion ROM on NuStep, self-selected tolerable range. Returned to room via w/c, ended session in w/c and all needs in reach.   Session 2:  Pt in recliner and agreeable to therapy, pain 4/10. Stand pivot to w/c w/ supervision using RW. Pt self-propelled w/c around unit w/ supervision using BUEs. Practiced car transfer at pt's sedan height, supervision, and practiced getting in/out of ADL apartment bed w/ supervision as well. Pt also able to perform bed mobility at that height w/ supervision. RLE ROM/strengthening exercises as detailed below, brief rest periods 2/2 pain. Kept CAM boot on during knee/hip exercises per pt comfort. Returned to room and ended session in supine, all needs in reach. Ice applied to anterior R ankle for pain and swelling relief.   RLE ROM/strengthening: -LAQs 3x10 -seated heel slides 3x10  -seated knee marches 3x10 -seated ankle pumps 3x10 -seated heel raises 3x10 -passive DF stretch 15 sec x4 -supine ankle pumps 2x10 -supine ankle circles 1x20 -ankle windshield wipers 1x20   Therapy Documentation Precautions:   Precautions Precautions: Fall, Back Precaution Comments: Back precautions for comfort (thoracic/lumbar transverse process fxs) Required Braces or Orthoses: Other Brace Splint/Cast: R foot- CAM boot Restrictions Weight Bearing Restrictions: Yes RLE Weight Bearing: Weight bearing as tolerated Pain: Pain Assessment Pain Scale: 0-10 Pain Score: 2  Pain Type: Acute pain Pain Location: Back Pain Descriptors / Indicators: Discomfort(from coughing) Pain Frequency: Intermittent Pain Intervention(s): Refused  Therapy/Group: Individual Therapy  Ryver Poblete K Zully Frane 01/02/2019, 10:59 AM

## 2019-01-02 NOTE — Progress Notes (Signed)
Pt had PPD on 7/14 to [facility]. Results negative noted. PPD skin report in shadow chart.  Erie Noe, RN

## 2019-01-02 NOTE — Discharge Instructions (Signed)
Inpatient Rehab Discharge Instructions  [facility] Discharge date and time: No discharge date for patient encounter.   Activities/Precautions/ Functional Status: Activity: Weightbearing as tolerated with Cam Walker and splint Diet: regular diet Wound Care: none needed Functional status:  ___ No restrictions     ___ Walk up steps independently ___ 24/7 supervision/assistance   ___ Walk up steps with assistance ___ Intermittent supervision/assistance  ___ Bathe/dress independently ___ Walk with walker     _x__ Bathe/dress with assistance ___ Walk Independently    ___ Shower independently ___ Walk with assistance    ___ Shower with assistance ___ No alcohol     ___ Return to work/school ________  COMMUNITY REFERRALS UPON DISCHARGE:   Home Health:   PT  Agency:  [facility] Phone:  [phone] - They will call you to set up appointment.  They plan to come out on Sunday or Monday. Medical Equipment/Items Ordered:  20"x18" lightweight wheelchair with basic cushion; rolling walker; bedside commode; tub transfer bench  Agency/Supplier:  [facility]     Phone:  [phone]  Special Instructions: No driving smoking or alcohol   My questions have been answered and I understand these instructions. I will adhere to these goals and the provided educational materials after my discharge from the hospital.  Patient/Caregiver Signature _______________________________ Date __________  Clinician Signature _______________________________________ Date __________  Please bring this form and your medication list with you to all your follow-up doctor's appointments.

## 2019-01-02 NOTE — Progress Notes (Addendum)
Social Work Discharge Note   The overall goal for the admission was met for:   Discharge location: Yes - home with family  Length of Stay: Yes - 6 days  Discharge activity level: Yes - modified independent  Home/community participation: Yes  Services provided included: MD, RD, PT, OT, RN, Pharmacy, Neuropsych and SW  Financial Services: Private Insurance: Theme park manager  Follow-up services arranged: Home Health: PT from [facility], DME: 20"x18" lightweight w/c with basic cushion; rolling walker; bedside commode; tub transfer bench from [employer] and Patient/Family has no preference for HH/DME agencies  Comments (or additional information):  Pt is independent to direct her own care, therefore we did not need to do family education.  Pt actually discharged on 01-03-19 after she completed her therapy day.  She met all goals.  Patient/Family verbalized understanding of follow-up arrangements: Yes  Individual responsible for coordination of the follow-up plan: pt and her husband, Hannie Shoe [phone]  Confirmed correct DME delivered: Trey Sailors 01/03/2019    Devetta Hagenow, Silvestre Mesi
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
## 2019-01-02 NOTE — Progress Notes (Signed)
Patient ID: [name], female   DOB: 04-01-1986, 33 y.o.   MRN: [id number]      Diagnosis codes:  S82.831D; S92.00D; S22.059D; S22.069D; S22.079D; S32.059D  Height:   5'           Weight:    99.8 kgs/220 lbs        Patient suffers from multiple orthopedic trauma including fibula fractures and lumbar transverse process fracture and postoperative pain which impairs her ability to perform daily activities like bathing, grooming, feeding, and toileting in the home.  A walker or cane will not resolve issue with performing activities of daily living.  A wheelchair will allow patient to safely perform daily activities.  Patient is not able to propel herself in the home using a standard weight wheelchair due to endurance and general weakness.  Patient can self propel in the lightweight wheelchair.

## 2019-01-03 ENCOUNTER — Inpatient Hospital Stay (HOSPITAL_COMMUNITY): Payer: 59 | Admitting: Physical Therapy

## 2019-01-03 ENCOUNTER — Inpatient Hospital Stay (HOSPITAL_COMMUNITY): Payer: 59 | Admitting: Occupational Therapy

## 2019-01-03 MED ORDER — GABAPENTIN 300 MG PO CAPS: 300.0000 mg | ORAL_CAPSULE | Freq: Three times a day (TID) | ORAL | 1 refills | Status: AC

## 2019-01-03 MED ORDER — DOCUSATE SODIUM 100 MG PO CAPS: 100.0000 mg | ORAL_CAPSULE | Freq: Two times a day (BID) | ORAL | 0 refills | Status: AC

## 2019-01-03 MED ORDER — OXYCODONE HCL 5 MG PO TABS: 5.0000 mg | ORAL_TABLET | ORAL | 0 refills | Status: AC | PRN

## 2019-01-03 MED ORDER — POLYETHYLENE GLYCOL 3350 17 G PO PACK: 17.0000 g | PACK | Freq: Every day | ORAL | 0 refills | Status: AC

## 2019-01-03 MED ORDER — METHOCARBAMOL 500 MG PO TABS: 1000.0000 mg | ORAL_TABLET | Freq: Three times a day (TID) | ORAL | 1 refills | Status: AC

## 2019-01-03 MED ORDER — TRAMADOL HCL 50 MG PO TABS: 50.0000 mg | ORAL_TABLET | Freq: Four times a day (QID) | ORAL | 0 refills | Status: AC
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
## 2019-01-03 NOTE — Progress Notes (Signed)
Per PA and SW pt ok to DC home today rather than in morning. Pt husband will be here around 40. Will let SW know for equipment delivery.   Erie Noe, RN

## 2019-01-03 NOTE — Plan of Care (Signed)
  Problem: Consults Goal: RH GENERAL PATIENT EDUCATION Description: See Patient Education module for education specifics. 01/03/2019 1418 by Erie Noe, RN Outcome: Completed/Met 01/03/2019 1030 by Erie Noe, RN Outcome: Progressing Goal: Skin Care Protocol Initiated - if Braden Score 18 or less Description: If consults are not indicated, leave blank or document N/A Outcome: Completed/Met Goal: Nutrition Consult-if indicated Outcome: Completed/Met   Problem: RH BOWEL ELIMINATION Goal: RH STG MANAGE BOWEL WITH ASSISTANCE Description: STG Manage Bowel with Assistance. 01/03/2019 1418 by Erie Noe, RN Outcome: Completed/Met 01/03/2019 1030 by Erie Noe, RN Outcome: Progressing   Problem: RH SKIN INTEGRITY Goal: RH STG SKIN FREE OF INFECTION/BREAKDOWN 01/03/2019 1418 by Erie Noe, RN Outcome: Completed/Met 01/03/2019 1030 by Erie Noe, RN Outcome: Progressing Goal: RH STG ABLE TO PERFORM INCISION/WOUND CARE W/ASSISTANCE Description: STG Able To Perform Incision/Wound Care With Assistance. Outcome: Completed/Met   Problem: RH SAFETY Goal: RH STG ADHERE TO SAFETY PRECAUTIONS W/ASSISTANCE/DEVICE Description: STG Adhere to Safety Precautions With Assistance/Device. 01/03/2019 1418 by Erie Noe, RN Outcome: Completed/Met 01/03/2019 1030 by Erie Noe, RN Outcome: Progressing   Problem: RH PAIN MANAGEMENT Goal: RH STG PAIN MANAGED AT OR BELOW PT'S PAIN GOAL 01/03/2019 1418 by Erie Noe, RN Outcome: Completed/Met 01/03/2019 1030 by Erie Noe, RN Outcome: Progressing   Problem: RH KNOWLEDGE DEFICIT GENERAL Goal: RH STG INCREASE KNOWLEDGE OF SELF CARE AFTER HOSPITALIZATION 01/03/2019 1418 by Erie Noe, RN Outcome: Completed/Met 01/03/2019 1030 by Erie Noe, RN Outcome: Progressing

## 2019-01-03 NOTE — Progress Notes (Signed)
Physical Therapy Session Note  Patient Details  Name: [name] MRN: [id number] Date of Birth: 1985-11-13  Today's Date: 01/03/2019 PT Individual Time: 0810-0905 PT Individual Time Calculation (min): 55 min   Short Term Goals: Week 1:  PT Short Term Goal 1 (Week 1): STG=LTG due to ELOS  Skilled Therapeutic Interventions/Progress Updates:   Pt received supine in bed, with RN administering Medication. PT returned in 10 min, and agreeable to PT. Ambulatory transfer to bathroom with RW and supervision assist from PT. Pt able to perform all clothing management and peri care on toilet without assist from PT. Pt performed standing hand hygiene with only set up to obtain hand towel.   WC mobility to day room with supervision assist  X 292f with min cues for doorway management and improved use of momentum to reduce fatigue.   Gait training with RW x 52fwith supervision assist and cues for increased WB as tolerated on the RLE. Sit>stand with supervision assist prior to gait training with supervision assist with cues for improved UE placement to prevent twisting in low back.    Stand pivot transfer to and from nustep with distant supervision assist from PT with increasing pain RLE with return to [facility] reciprocal and endurance training to allow gradual increased WB through the RLE. 7 min at level 1 with multiple rest breaks due to cramping/spasm in dosal aspect of the foot.   Patient returned to room and [facility] in reach and all needs met.         Therapy Documentation Precautions:  Precautions Precautions: None Precaution Comments: Back precautions for comfort (thoracic/lumbar transverse process fxs) Required Braces or Orthoses: Other Brace Splint/Cast: R foot- CAM boot Restrictions Weight Bearing Restrictions: Yes RLE Weight Bearing: Weight bearing as tolerated Other Position/Activity Restrictions: CAM boot Vital Signs: Therapy Vitals Temp: 98.5 F  (36.9 C) Temp Source: Oral Pulse Rate: 68 Resp: 16 BP: 107/61 Patient Position (if appropriate): Lying Oxygen Therapy SpO2: 96 % O2 Device: Room Air Pain: Pain Assessment Pain Scale: 0-10 Pain Score: 5  Pain Type: Acute pain Pain Location: Ankle Pain Orientation: Right Pain Descriptors / Indicators: Aching Pain Onset: On-going Pain Intervention(s): Repositioned;Rest    Therapy/Group: Individual Therapy  AuLorie Phenix/17/2020, 10:37 AM

## 2019-01-03 NOTE — Progress Notes (Signed)
Physical Therapy Discharge Summary  Patient Details  Name: [name] MRN: [id number] Date of Birth: December 17, 1985  Today's Date: 01/03/2019 PT Individual Time: 6803-2122 PT Individual Time Calculation (min): 70 min   Pt in w/c and agreeable to therapy, pain as detailed below. Pt self-propelled w/c around unit, mod/i, >150' at a time. Worked on stair negotiation techniques in preparation for returning to work which is not handicap accessible. Negotiated 3" steps x3 forwards w/ B rails, CGA. Discussed technique w/ unilateral rail as she cannot reach both rails at work. Performed sideways w/ CGA and verbal cues for technique. Negotiated 3" steps x3 w/ this technique. Pt very fatigued w/ stair negotiation and puts minimal weight on RLE, needed prolonged seated rest breaks. Practiced negotiating curb w/ RW as well, CGA. Ambulated back towards room, supervision x50'. Mod/i w/c transport remainder of way. Ended session in recliner, all needs in reach. Provided pt w/ written handout of HEP (see exercises from session on 7/16).   Patient has met 6 of 6 long term goals due to improved activity tolerance, improved balance, increased strength, increased range of motion, decreased pain and ability to compensate for deficits.  Patient to discharge at a wheelchair level Modified Independent and supervision for household gait w/ RW.   Patient's care partner is independent to provide the necessary physical assistance at discharge. Pt able to direct her care appropriately and safely, no formal family education necessary.   Reasons goals not met: n/a  Recommendation:  Patient will benefit from ongoing skilled PT services in home health setting to continue to advance safe functional mobility, address ongoing impairments in functional balance, endurance, RLE strengthening and ROM, and minimize fall risk.  Equipment: w/c, RW  Reasons for discharge: treatment goals met and discharge from hospital  Patient/family  agrees with progress made and goals achieved: Yes  PT Discharge Precautions/Restrictions Precautions Precautions: None Restrictions Weight Bearing Restrictions: Yes RLE Weight Bearing: Weight bearing as tolerated Other Position/Activity Restrictions: CAM boot Vital Signs Therapy Vitals Temp: 98.5 F (36.9 C) Temp Source: Oral Pulse Rate: 68 Resp: 16 BP: 107/61 Patient Position (if appropriate): Lying Oxygen Therapy SpO2: 96 % O2 Device: Room Air Pain Pain Assessment Pain Scale: 0-10 Pain Score: 5  Pain Type: Acute pain Pain Location: Ankle Pain Orientation: Right Pain Descriptors / Indicators: Aching Pain Onset: On-going Pain Intervention(s): Repositioned;Rest Cognition Orientation Level: Oriented X4 Sensation Sensation Light Touch: Appears Intact Coordination Gross Motor Movements are Fluid and Coordinated: No Coordination and Movement Description: RLE impaired 2/2 pain guarding Motor  Motor Motor: Within Functional Limits  Mobility Bed Mobility Bed Mobility: Rolling Right;Rolling Left;Supine to Sit;Sit to Supine Rolling Right: Supervision/verbal cueing Rolling Left: Supervision/Verbal cueing Supine to Sit: Supervision/Verbal cueing Sit to Supine: Supervision/Verbal cueing Transfers Transfers: Sit to Stand;Stand to Sit;Stand Pivot Transfers Sit to Stand: Supervision/Verbal cueing Stand to Sit: Supervision/Verbal cueing Stand Pivot Transfers: Supervision/Verbal cueing Transfer (Assistive device): Rolling walker Locomotion  Gait Ambulation: Yes Gait Assistance: Supervision/Verbal cueing Gait Distance (Feet): 50 Feet Assistive device: Rolling walker Stairs / Additional Locomotion Stairs: Yes Stairs Assistance: Contact Guard/Touching assist Stair Management Technique: Two rails Number of Stairs: 3 Height of Stairs: 3 Wheelchair Mobility Wheelchair Mobility: Yes Wheelchair Assistance: Independent with Camera operator: Both  upper extremities Wheelchair Parts Management: Independent Distance: 150'  Trunk/Postural Assessment  Cervical Assessment Cervical Assessment: Within Functional Limits Thoracic Assessment Thoracic Assessment: Within Functional Limits Lumbar Assessment Lumbar Assessment: Within Functional Limits Postural Control Postural Control: Within Functional Limits  Balance Balance  Balance Assessed: Yes Static Sitting Balance Static Sitting - Level of Assistance: 7: Independent Dynamic Sitting Balance Dynamic Sitting - Level of Assistance: 7: Independent Static Standing Balance Static Standing - Level of Assistance: 6: Modified independent (Device/Increase time) Dynamic Standing Balance Dynamic Standing - Level of Assistance: 5: Stand by assistance Extremity Assessment  RLE Assessment RLE Assessment: Exceptions to [facility] General Strength Comments: Unable to tolerate any resistance to ankle musculature 2/2 pain RLE PROM (degrees) Right Ankle Dorsiflexion: -25 Right Ankle Plantar Flexion: 40 RLE Strength RLE Overall Strength: Due to pain;Deficits Right Hip Flexion: 5/5 Right Hip Extension: 5/5 Right Hip ABduction: 5/5 Right Hip ADduction: 5/5 Right Knee Flexion: 2/5 Right Knee Extension: 2/5 Right Ankle Dorsiflexion: 2/5 Right Ankle Plantar Flexion: 2/5 Right Ankle Inversion: 2/5 Right Ankle Eversion: 2/5 LLE Assessment LLE Assessment: Within Functional Limits    Tawanna Funk K Pearlene Teat 01/03/2019, 10:40 AM

## 2019-01-03 NOTE — Plan of Care (Signed)
  Problem: Consults Goal: RH GENERAL PATIENT EDUCATION Description: See Patient Education module for education specifics. Outcome: Progressing   Problem: RH BOWEL ELIMINATION Goal: RH STG MANAGE BOWEL WITH ASSISTANCE Description: STG Manage Bowel with Assistance. Outcome: Progressing   Problem: RH SKIN INTEGRITY Goal: RH STG SKIN FREE OF INFECTION/BREAKDOWN Outcome: Progressing   Problem: RH SAFETY Goal: RH STG ADHERE TO SAFETY PRECAUTIONS W/ASSISTANCE/DEVICE Description: STG Adhere to Safety Precautions With Assistance/Device. Outcome: Progressing   Problem: RH PAIN MANAGEMENT Goal: RH STG PAIN MANAGED AT OR BELOW PT'S PAIN GOAL Outcome: Progressing   Problem: RH KNOWLEDGE DEFICIT GENERAL Goal: RH STG INCREASE KNOWLEDGE OF SELF CARE AFTER HOSPITALIZATION Outcome: Progressing

## 2019-01-03 NOTE — Progress Notes (Signed)
Social Work Patient ID: [name], female   DOB: Jul 21, 1985, 33 y.o.   MRN: [id number] Pt asking if she can discharge today after therapies instead of tomorrow. Awaiting return call from MD and will get equipment up here in room prior to DC.

## 2019-01-03 NOTE — Progress Notes (Signed)
Pt DC summary complete by PA. Equipment delivered and husband present for DC. Belongings packed and pt transported to main lobby.   Erie Noe, RN

## 2019-01-03 NOTE — Progress Notes (Signed)
Occupational Therapy Discharge Summary  Patient Details  Name: [name] MRN: [id number] Date of Birth: 05-11-1986  Today's Date: 01/03/2019 OT Individual Time: 0370-4888 OT Individual Time Calculation (min): 55 min    OT treatment session focused on increased independence with BADL tasks. Pt able to access dresser drawers, collect clothing, ambulate to bathroom with RW all with supervision. OT wrapped R LE in water proof bag, then bathing/dressing completed mod I with increased time. Discussed dc plan, home set-up, and bathroom modifications for safe BADL participation. Practiced ambulating into tub room and completed tub bench transfer w/ supervision.  Pt propelled wc back to room and left seated in wc with chair alarm on and needs met.   Patient has met 7 of 7 long term goals due to improved activity tolerance, improved balance, postural control, ability to compensate for deficits and functional use of  RIGHT lower extremity.  Patient to discharge at overall supervision/ Modified Independent level.  Patient's care partner is independent to provide the necessary physical assistance at discharge for higher level iADL tasks.    Reasons goals not met: n/a  Recommendation:  No further OT needs  Equipment: reacher, toilet tongs, tub transfer bench, 3-in-1 BSC  Reasons for discharge: treatment goals met and discharge from hospital  Patient/family agrees with progress made and goals achieved: Yes  OT Discharge Precautions/Restrictions  Precautions Precautions: None Required Braces or Orthoses: Other Brace Splint/Cast: R foot- CAM boot Restrictions Weight Bearing Restrictions: Yes RLE Weight Bearing: Weight bearing as tolerated Other Position/Activity Restrictions: CAM boot Pain Pain Assessment Pain Score: 5  Pain Type: Acute pain Pain Location: Ankle Pain Orientation: Right Pain Descriptors / Indicators: Aching Pain Onset: On-going Pain Intervention(s):  Repositioned;Rest ADL ADL Eating: Independent Grooming: Independent Upper Body Bathing: Independent Lower Body Bathing: Modified independent Upper Body Dressing: Independent Lower Body Dressing: Modified independent Toileting: Modified independent Toilet Transfer: Distant supervision Toilet Transfer Method: Ambulating Tub/Shower Transfer: Distant supervision Tub/Shower Transfer Method: Ambulating Perception  Perception: Within Functional Limits Praxis Praxis: Intact Cognition Overall Cognitive Status: Within Functional Limits for tasks assessed Arousal/Alertness: Awake/alert Orientation Level: Oriented X4 Attention: Selective Focused Attention: Appears intact Sustained Attention: Appears intact Selective Attention: Appears intact Memory: Appears intact Awareness: Appears intact Problem Solving: Appears intact Safety/Judgment: Appears intact Sensation Sensation Light Touch: Appears Intact Proprioception: Appears Intact Additional Comments: sensation WFL Coordination Gross Motor Movements are Fluid and Coordinated: No Fine Motor Movements are Fluid and Coordinated: Yes Coordination and Movement Description: RLE impaired 2/2 pain guarding Motor  Motor Motor: Within Functional Limits Motor - Skilled Clinical Observations: weakness R LE Mobility  Bed Mobility Bed Mobility: Rolling Right;Rolling Left;Supine to Sit;Sit to Supine Rolling Right: Supervision/verbal cueing Rolling Left: Supervision/Verbal cueing Supine to Sit: Supervision/Verbal cueing Sit to Supine: Supervision/Verbal cueing Transfers Sit to Stand: Independent with assistive device Stand to Sit: Independent with assistive device  Trunk/Postural Assessment  Cervical Assessment Cervical Assessment: Within Functional Limits Thoracic Assessment Thoracic Assessment: Within Functional Limits Lumbar Assessment Lumbar Assessment: Within Functional Limits Postural Control Postural Control: Within Functional  Limits  Balance Balance Balance Assessed: Yes Static Sitting Balance Static Sitting - Level of Assistance: 7: Independent Dynamic Sitting Balance Dynamic Sitting - Level of Assistance: 7: Independent Static Standing Balance Static Standing - Level of Assistance: 6: Modified independent (Device/Increase time) Dynamic Standing Balance Dynamic Standing - Level of Assistance: 5: Stand by assistance Extremity/Trunk Assessment RUE Assessment RUE Assessment: Within Functional Limits LUE Assessment LUE Assessment: Within Functional Limits   Daneen Schick Kashus Karlen 01/03/2019, 2:05  PM

## 2019-01-15 ENCOUNTER — Ambulatory Visit (INDEPENDENT_AMBULATORY_CARE_PROVIDER_SITE_OTHER): Payer: 59 | Admitting: Orthopedic Surgery

## 2019-01-15 ENCOUNTER — Encounter: Payer: Self-pay | Admitting: Orthopedic Surgery

## 2019-01-15 ENCOUNTER — Ambulatory Visit: Payer: Self-pay

## 2019-01-15 ENCOUNTER — Encounter (HOSPITAL_COMMUNITY): Payer: Self-pay | Admitting: Urology

## 2019-01-15 ENCOUNTER — Other Ambulatory Visit: Payer: Self-pay

## 2019-01-15 DIAGNOSIS — S82831D Other fracture of upper and lower end of right fibula, subsequent encounter for closed fracture with routine healing: Secondary | ICD-10-CM | POA: Diagnosis not present
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
## 2019-02-03 ENCOUNTER — Ambulatory Visit (INDEPENDENT_AMBULATORY_CARE_PROVIDER_SITE_OTHER): Payer: 59 | Admitting: Orthopedic Surgery

## 2019-02-03 ENCOUNTER — Encounter: Payer: Self-pay | Admitting: Orthopedic Surgery

## 2019-02-03 DIAGNOSIS — S92001D Unspecified fracture of right calcaneus, subsequent encounter for fracture with routine healing: Secondary | ICD-10-CM

## 2019-02-05 ENCOUNTER — Telehealth: Payer: Self-pay | Admitting: Orthopedic Surgery

## 2019-02-05 ENCOUNTER — Encounter: Payer: Self-pay | Admitting: Orthopedic Surgery

## 2019-02-05 NOTE — Telephone Encounter (Signed)
8/17 ov note & work note faxed to [facility]
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
## 2019-02-11 ENCOUNTER — Inpatient Hospital Stay: Payer: 59 | Admitting: Physical Medicine & Rehabilitation

## 2019-02-21 ENCOUNTER — Encounter: Payer: Self-pay | Admitting: Orthopedic Surgery

## 2019-02-21 ENCOUNTER — Ambulatory Visit (INDEPENDENT_AMBULATORY_CARE_PROVIDER_SITE_OTHER): Payer: 59 | Admitting: Orthopedic Surgery

## 2019-02-21 DIAGNOSIS — S92001D Unspecified fracture of right calcaneus, subsequent encounter for fracture with routine healing: Secondary | ICD-10-CM

## 2019-02-21 DIAGNOSIS — S82831D Other fracture of upper and lower end of right fibula, subsequent encounter for closed fracture with routine healing: Secondary | ICD-10-CM
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
## 2019-02-25 ENCOUNTER — Encounter: Payer: 59 | Attending: Physical Medicine & Rehabilitation | Admitting: Physical Medicine & Rehabilitation
